# Patient Record
Sex: Male | Born: 1940 | Race: White | Hispanic: No | Marital: Married | Smoking: Former smoker
Health system: Southern US, Community
[De-identification: ages and names within clinical notes are randomized; demographics above are authoritative.]

## PROBLEM LIST (undated history)

## (undated) DIAGNOSIS — Z933 Colostomy status: Secondary | ICD-10-CM

## (undated) DIAGNOSIS — Z9221 Personal history of antineoplastic chemotherapy: Secondary | ICD-10-CM

## (undated) DIAGNOSIS — E8809 Other disorders of plasma-protein metabolism, not elsewhere classified: Secondary | ICD-10-CM

## (undated) DIAGNOSIS — I471 Supraventricular tachycardia, unspecified: Secondary | ICD-10-CM

## (undated) DIAGNOSIS — K219 Gastro-esophageal reflux disease without esophagitis: Secondary | ICD-10-CM

## (undated) DIAGNOSIS — E785 Hyperlipidemia, unspecified: Secondary | ICD-10-CM

## (undated) DIAGNOSIS — N186 End stage renal disease: Secondary | ICD-10-CM

## (undated) DIAGNOSIS — K922 Gastrointestinal hemorrhage, unspecified: Secondary | ICD-10-CM

## (undated) DIAGNOSIS — I10 Essential (primary) hypertension: Secondary | ICD-10-CM

## (undated) DIAGNOSIS — E46 Unspecified protein-calorie malnutrition: Secondary | ICD-10-CM

## (undated) DIAGNOSIS — R011 Cardiac murmur, unspecified: Secondary | ICD-10-CM

## (undated) DIAGNOSIS — N189 Chronic kidney disease, unspecified: Secondary | ICD-10-CM

## (undated) DIAGNOSIS — D649 Anemia, unspecified: Secondary | ICD-10-CM

## (undated) DIAGNOSIS — R0602 Shortness of breath: Secondary | ICD-10-CM

## (undated) DIAGNOSIS — D631 Anemia in chronic kidney disease: Secondary | ICD-10-CM

## (undated) DIAGNOSIS — N289 Disorder of kidney and ureter, unspecified: Secondary | ICD-10-CM

## (undated) DIAGNOSIS — Z923 Personal history of irradiation: Secondary | ICD-10-CM

## (undated) DIAGNOSIS — Z9289 Personal history of other medical treatment: Secondary | ICD-10-CM

## (undated) DIAGNOSIS — I714 Abdominal aortic aneurysm, without rupture: Secondary | ICD-10-CM

## (undated) DIAGNOSIS — I5032 Chronic diastolic (congestive) heart failure: Secondary | ICD-10-CM

## (undated) DIAGNOSIS — I214 Non-ST elevation (NSTEMI) myocardial infarction: Secondary | ICD-10-CM

## (undated) DIAGNOSIS — C3490 Malignant neoplasm of unspecified part of unspecified bronchus or lung: Secondary | ICD-10-CM

## (undated) DIAGNOSIS — M1990 Unspecified osteoarthritis, unspecified site: Secondary | ICD-10-CM

## (undated) DIAGNOSIS — Z888 Allergy status to other drugs, medicaments and biological substances status: Secondary | ICD-10-CM

## (undated) DIAGNOSIS — I350 Nonrheumatic aortic (valve) stenosis: Secondary | ICD-10-CM

## (undated) DIAGNOSIS — Z992 Dependence on renal dialysis: Secondary | ICD-10-CM

## (undated) HISTORY — DX: Supraventricular tachycardia, unspecified: I47.10

## (undated) HISTORY — DX: Supraventricular tachycardia: I47.1

## (undated) HISTORY — DX: Nonrheumatic aortic (valve) stenosis: I35.0

## (undated) HISTORY — PX: COLON SURGERY: SHX602

## (undated) HISTORY — DX: Essential (primary) hypertension: I10

## (undated) HISTORY — DX: Gastro-esophageal reflux disease without esophagitis: K21.9

## (undated) HISTORY — PX: CATARACT EXTRACTION W/ INTRAOCULAR LENS IMPLANT: SHX1309

## (undated) HISTORY — DX: Malignant neoplasm of unspecified part of unspecified bronchus or lung: C34.90

## (undated) HISTORY — PX: PILONIDAL CYST / SINUS EXCISION: SUR543

## (undated) HISTORY — DX: Colostomy status: Z93.3

## (undated) HISTORY — DX: Hyperlipidemia, unspecified: E78.5

---

## 2006-07-23 DIAGNOSIS — Z9289 Personal history of other medical treatment: Secondary | ICD-10-CM

## 2006-07-23 HISTORY — DX: Personal history of other medical treatment: Z92.89

## 2006-07-23 HISTORY — PX: ARTERIOVENOUS GRAFT PLACEMENT: SUR1029

## 2007-02-21 DIAGNOSIS — I714 Abdominal aortic aneurysm, without rupture, unspecified: Secondary | ICD-10-CM

## 2007-02-21 HISTORY — DX: Abdominal aortic aneurysm, without rupture, unspecified: I71.40

## 2007-03-11 ENCOUNTER — Emergency Department: Payer: Self-pay | Admitting: Emergency Medicine

## 2007-03-11 ENCOUNTER — Other Ambulatory Visit: Payer: Self-pay

## 2007-03-14 HISTORY — PX: ABDOMINAL AORTIC ANEURYSM REPAIR: SUR1152

## 2007-03-17 HISTORY — PX: LEFT COLECTOMY: SHX856

## 2007-03-17 HISTORY — PX: COLOSTOMY: SHX63

## 2007-05-14 ENCOUNTER — Emergency Department: Payer: Self-pay | Admitting: Emergency Medicine

## 2007-05-28 ENCOUNTER — Ambulatory Visit: Payer: Self-pay | Admitting: Family Medicine

## 2007-07-01 ENCOUNTER — Other Ambulatory Visit: Payer: Self-pay

## 2007-07-01 ENCOUNTER — Emergency Department: Payer: Self-pay | Admitting: Emergency Medicine

## 2007-07-24 DIAGNOSIS — C3490 Malignant neoplasm of unspecified part of unspecified bronchus or lung: Secondary | ICD-10-CM

## 2007-07-24 HISTORY — DX: Malignant neoplasm of unspecified part of unspecified bronchus or lung: C34.90

## 2007-10-09 ENCOUNTER — Ambulatory Visit: Payer: Self-pay | Admitting: Otolaryngology

## 2008-03-23 ENCOUNTER — Ambulatory Visit: Payer: Self-pay | Admitting: Oncology

## 2008-04-05 ENCOUNTER — Ambulatory Visit: Payer: Self-pay | Admitting: Oncology

## 2008-04-08 ENCOUNTER — Ambulatory Visit: Payer: Self-pay | Admitting: Oncology

## 2008-04-16 ENCOUNTER — Other Ambulatory Visit: Payer: Self-pay

## 2008-04-16 ENCOUNTER — Ambulatory Visit: Payer: Self-pay | Admitting: General Surgery

## 2008-04-21 ENCOUNTER — Inpatient Hospital Stay: Payer: Self-pay | Admitting: General Surgery

## 2008-04-22 ENCOUNTER — Ambulatory Visit: Payer: Self-pay | Admitting: Radiation Oncology

## 2008-04-30 ENCOUNTER — Ambulatory Visit: Payer: Self-pay | Admitting: Vascular Surgery

## 2008-05-05 ENCOUNTER — Ambulatory Visit: Payer: Self-pay | Admitting: Oncology

## 2008-05-14 ENCOUNTER — Ambulatory Visit: Payer: Self-pay | Admitting: Vascular Surgery

## 2008-05-23 ENCOUNTER — Ambulatory Visit: Payer: Self-pay | Admitting: Oncology

## 2008-06-15 ENCOUNTER — Ambulatory Visit: Payer: Self-pay | Admitting: Vascular Surgery

## 2008-06-22 ENCOUNTER — Ambulatory Visit: Payer: Self-pay | Admitting: Oncology

## 2008-07-23 ENCOUNTER — Ambulatory Visit: Payer: Self-pay | Admitting: Oncology

## 2008-08-23 ENCOUNTER — Ambulatory Visit: Payer: Self-pay | Admitting: Oncology

## 2008-09-07 ENCOUNTER — Ambulatory Visit: Payer: Self-pay | Admitting: Oncology

## 2008-09-16 ENCOUNTER — Ambulatory Visit: Payer: Self-pay | Admitting: Oncology

## 2008-09-20 ENCOUNTER — Ambulatory Visit: Payer: Self-pay | Admitting: Oncology

## 2008-10-28 ENCOUNTER — Ambulatory Visit: Payer: Self-pay | Admitting: Oncology

## 2008-11-20 ENCOUNTER — Ambulatory Visit: Payer: Self-pay | Admitting: Oncology

## 2008-12-21 ENCOUNTER — Ambulatory Visit: Payer: Self-pay | Admitting: Oncology

## 2009-01-21 ENCOUNTER — Ambulatory Visit: Payer: Self-pay | Admitting: Vascular Surgery

## 2009-01-25 ENCOUNTER — Ambulatory Visit: Payer: Self-pay | Admitting: Vascular Surgery

## 2009-02-20 ENCOUNTER — Ambulatory Visit: Payer: Self-pay | Admitting: Oncology

## 2009-03-15 ENCOUNTER — Ambulatory Visit: Payer: Self-pay | Admitting: Vascular Surgery

## 2009-03-15 ENCOUNTER — Ambulatory Visit: Payer: Self-pay | Admitting: Oncology

## 2009-03-17 ENCOUNTER — Ambulatory Visit: Payer: Self-pay | Admitting: Oncology

## 2009-03-23 ENCOUNTER — Ambulatory Visit: Payer: Self-pay | Admitting: Oncology

## 2009-04-04 ENCOUNTER — Inpatient Hospital Stay: Payer: Self-pay | Admitting: Internal Medicine

## 2009-06-22 ENCOUNTER — Ambulatory Visit: Payer: Self-pay | Admitting: Oncology

## 2009-07-13 ENCOUNTER — Ambulatory Visit: Payer: Self-pay | Admitting: Oncology

## 2009-07-23 ENCOUNTER — Ambulatory Visit: Payer: Self-pay | Admitting: Oncology

## 2009-08-24 ENCOUNTER — Ambulatory Visit: Payer: Self-pay | Admitting: Oncology

## 2009-09-20 ENCOUNTER — Ambulatory Visit: Payer: Self-pay | Admitting: Oncology

## 2009-10-04 ENCOUNTER — Ambulatory Visit: Payer: Self-pay | Admitting: Oncology

## 2009-10-07 ENCOUNTER — Ambulatory Visit: Payer: Self-pay | Admitting: Oncology

## 2009-10-21 ENCOUNTER — Ambulatory Visit: Payer: Self-pay | Admitting: Oncology

## 2009-11-20 ENCOUNTER — Ambulatory Visit: Payer: Self-pay | Admitting: Oncology

## 2009-11-24 ENCOUNTER — Ambulatory Visit: Payer: Self-pay | Admitting: Oncology

## 2009-12-08 ENCOUNTER — Ambulatory Visit: Payer: Self-pay | Admitting: Vascular Surgery

## 2009-12-21 ENCOUNTER — Ambulatory Visit: Payer: Self-pay | Admitting: Oncology

## 2010-01-20 ENCOUNTER — Ambulatory Visit: Payer: Self-pay | Admitting: Oncology

## 2010-02-20 ENCOUNTER — Ambulatory Visit: Payer: Self-pay | Admitting: Oncology

## 2010-03-23 ENCOUNTER — Ambulatory Visit: Payer: Self-pay | Admitting: Oncology

## 2010-04-22 ENCOUNTER — Ambulatory Visit: Payer: Self-pay | Admitting: Oncology

## 2010-05-18 ENCOUNTER — Ambulatory Visit: Payer: Self-pay | Admitting: Vascular Surgery

## 2010-05-23 ENCOUNTER — Ambulatory Visit: Payer: Self-pay | Admitting: Oncology

## 2010-05-30 ENCOUNTER — Ambulatory Visit: Payer: Self-pay | Admitting: Oncology

## 2010-06-10 ENCOUNTER — Emergency Department: Payer: Self-pay | Admitting: Emergency Medicine

## 2010-06-13 ENCOUNTER — Ambulatory Visit: Payer: Self-pay | Admitting: Oncology

## 2010-06-22 ENCOUNTER — Ambulatory Visit: Payer: Self-pay | Admitting: Oncology

## 2010-07-23 ENCOUNTER — Ambulatory Visit: Payer: Self-pay | Admitting: Oncology

## 2010-08-03 ENCOUNTER — Observation Stay: Payer: Self-pay | Admitting: Nephrology

## 2010-08-15 ENCOUNTER — Ambulatory Visit: Payer: Self-pay | Admitting: Oncology

## 2010-08-23 ENCOUNTER — Ambulatory Visit: Payer: Self-pay | Admitting: Oncology

## 2010-11-23 ENCOUNTER — Ambulatory Visit: Payer: Self-pay | Admitting: Oncology

## 2010-11-28 ENCOUNTER — Ambulatory Visit: Payer: Self-pay | Admitting: Oncology

## 2010-11-29 ENCOUNTER — Ambulatory Visit: Payer: Self-pay | Admitting: Ophthalmology

## 2010-12-22 ENCOUNTER — Ambulatory Visit: Payer: Self-pay | Admitting: Oncology

## 2011-01-12 ENCOUNTER — Emergency Department: Payer: Self-pay | Admitting: Emergency Medicine

## 2011-03-06 ENCOUNTER — Ambulatory Visit: Payer: Self-pay | Admitting: Vascular Surgery

## 2011-05-22 ENCOUNTER — Ambulatory Visit: Payer: Self-pay | Admitting: Oncology

## 2011-05-24 ENCOUNTER — Ambulatory Visit: Payer: Self-pay | Admitting: Oncology

## 2011-06-15 ENCOUNTER — Encounter: Payer: Self-pay | Admitting: Internal Medicine

## 2011-06-18 ENCOUNTER — Encounter: Payer: Self-pay | Admitting: Internal Medicine

## 2011-06-18 ENCOUNTER — Ambulatory Visit (INDEPENDENT_AMBULATORY_CARE_PROVIDER_SITE_OTHER): Payer: Medicare Other | Admitting: Internal Medicine

## 2011-06-18 VITALS — BP 148/70 | HR 111 | Temp 98.5°F | Wt 156.0 lb

## 2011-06-18 DIAGNOSIS — Z9049 Acquired absence of other specified parts of digestive tract: Secondary | ICD-10-CM

## 2011-06-18 DIAGNOSIS — Z9889 Other specified postprocedural states: Secondary | ICD-10-CM

## 2011-06-18 DIAGNOSIS — N186 End stage renal disease: Secondary | ICD-10-CM

## 2011-06-18 NOTE — Progress Notes (Signed)
Subjective:    Patient ID: Gerald Cooke, male    DOB: January 20, 1941, 70 y.o.   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 He is doing well. Has no complaints today. Continues 3x week dialysis.  Ostomy functioning well. Main issue at present is that wife recently had "psychotic break" and is unable to help care for him (for example ostomy care).  He requests some assistance with daily care.  Outpatient Encounter Prescriptions as of 06/18/2011  Medication Sig Dispense Refill  . amLODipine (NORVASC) 10 MG tablet Take 10 mg by mouth daily.        Marland Kitchen aspirin EC 325 MG tablet Take 325 mg by mouth daily.        Marland Kitchen atorvastatin (LIPITOR) 40 MG tablet Take 40 mg by mouth daily.        . B Complex-C-Folic Acid (NEPHRONEX) LIQD Take by mouth.        . Calcium Acetate 667 MG TABS Take 1 tablet by mouth 3 (three) times daily with meals.        . cetirizine (ZYRTEC) 10 MG tablet Take 10 mg by mouth daily.        . fish oil-omega-3 fatty acids 1000 MG capsule Take 2 g by mouth daily.        Marland Kitchen lisinopril (PRINIVIL,ZESTRIL) 5 MG tablet Take 5 mg by mouth daily.        . ondansetron (ZOFRAN) 8 MG tablet Take 8 mg by mouth every 6 (six) hours as needed.        . ranitidine (ZANTAC) 150 MG tablet Take 150 mg by mouth 2 (two) times daily.        . sevelamer (RENVELA) 800 MG tablet Take 800 mg by mouth 3 (three) times daily with meals.        Marland Kitchen esomeprazole (NEXIUM) 40 MG capsule Take 40 mg by mouth 2 (two) times daily.        Marland Kitchen nystatin (NYSTOP) 100000 UNIT/GM POWD Apply topically as directed.        Marland Kitchen DISCONTD: Flaxseed, Linseed, (FLAXSEED OIL) 1200 MG CAPS Take 1 capsule by mouth 2 (two) times daily.          Review of Systems  Constitutional: Negative for fever, chills, activity change, appetite change, fatigue and unexpected weight change.  Eyes: Negative for visual disturbance.  Respiratory: Negative for cough and shortness of  breath.   Cardiovascular: Negative for chest pain, palpitations and leg swelling.  Gastrointestinal: Negative for abdominal pain and abdominal distention.  Genitourinary: Negative for dysuria, urgency and difficulty urinating.  Musculoskeletal: Negative for arthralgias and gait problem.  Skin: Negative for color change and rash.  Hematological: Negative for adenopathy.  Psychiatric/Behavioral: Negative for sleep disturbance and dysphoric mood. The patient is not nervous/anxious.    BP 148/70  Pulse 111  Temp(Src) 98.5 F (36.9 C) (Oral)  Wt 156 lb (70.761 kg)  SpO2 98%     Objective:   Physical Exam  Constitutional: He is oriented to person, place, and time. He appears well-developed and well-nourished. No distress.  HENT:  Head: Normocephalic and atraumatic.  Right Ear: External ear normal.  Left Ear: External ear normal.  Nose: Nose normal.  Mouth/Throat: Oropharynx is clear and moist. No oropharyngeal exudate.  Eyes: Conjunctivae and EOM are normal. Pupils are equal, round, and reactive to light. Right eye exhibits no discharge. Left eye exhibits no discharge. No  scleral icterus.  Neck: Normal range of motion. Neck supple. No tracheal deviation present. No thyromegaly present.  Cardiovascular: Normal rate, regular rhythm and normal heart sounds.  Exam reveals no gallop and no friction rub.   No murmur heard. Pulmonary/Chest: Effort normal and breath sounds normal. No respiratory distress. He has no wheezes. He has no rales. He exhibits no tenderness.  Abdominal: Soft. Bowel sounds are normal. He exhibits no distension and no mass. There is no tenderness. There is no rebound and no guarding.  Musculoskeletal: Normal range of motion. He exhibits edema (left upper extremity).  Lymphadenopathy:    He has no cervical adenopathy.  Neurological: He is alert and oriented to person, place, and time. No cranial nerve deficit. Coordination normal.  Skin: Skin is warm and dry. No rash  noted. He is not diaphoretic. No erythema. No pallor.     Psychiatric: He has a normal mood and affect. His behavior is normal. Judgment and thought content normal.          Assessment & Plan:  1. ESRD on HD - Will get recent records from nephrology. Pt will follow up in 1 month.  2. Bowel resection s/o ostomy - Needs help with ostomy care now that wife is unable to help him. Will set up home health.

## 2011-06-27 DIAGNOSIS — N186 End stage renal disease: Secondary | ICD-10-CM

## 2011-06-27 DIAGNOSIS — Z433 Encounter for attention to colostomy: Secondary | ICD-10-CM

## 2011-06-27 DIAGNOSIS — I120 Hypertensive chronic kidney disease with stage 5 chronic kidney disease or end stage renal disease: Secondary | ICD-10-CM

## 2011-06-27 DIAGNOSIS — Z992 Dependence on renal dialysis: Secondary | ICD-10-CM

## 2011-07-03 ENCOUNTER — Ambulatory Visit: Payer: Self-pay | Admitting: Oncology

## 2011-07-05 ENCOUNTER — Ambulatory Visit: Payer: Self-pay | Admitting: Oncology

## 2011-07-19 ENCOUNTER — Ambulatory Visit (INDEPENDENT_AMBULATORY_CARE_PROVIDER_SITE_OTHER): Payer: Medicare Other | Admitting: Internal Medicine

## 2011-07-19 ENCOUNTER — Encounter: Payer: Self-pay | Admitting: Internal Medicine

## 2011-07-19 VITALS — BP 142/68 | HR 87 | Temp 98.3°F | Wt 156.0 lb

## 2011-07-19 DIAGNOSIS — I10 Essential (primary) hypertension: Secondary | ICD-10-CM | POA: Insufficient documentation

## 2011-07-19 DIAGNOSIS — J40 Bronchitis, not specified as acute or chronic: Secondary | ICD-10-CM

## 2011-07-19 DIAGNOSIS — E785 Hyperlipidemia, unspecified: Secondary | ICD-10-CM

## 2011-07-19 DIAGNOSIS — C3490 Malignant neoplasm of unspecified part of unspecified bronchus or lung: Secondary | ICD-10-CM | POA: Insufficient documentation

## 2011-07-19 DIAGNOSIS — N186 End stage renal disease: Secondary | ICD-10-CM

## 2011-07-19 DIAGNOSIS — Z85118 Personal history of other malignant neoplasm of bronchus and lung: Secondary | ICD-10-CM

## 2011-07-19 MED ORDER — AZITHROMYCIN 250 MG PO TABS: ORAL_TABLET | ORAL | Status: AC

## 2011-07-19 MED ORDER — GUAIFENESIN-CODEINE 100-10 MG/5ML PO SYRP: 5.0000 mL | ORAL_SOLUTION | Freq: Two times a day (BID) | ORAL | Status: DC | PRN

## 2011-07-19 NOTE — Progress Notes (Signed)
Subjective:    Patient ID: Gerald Cooke, male    DOB: 1941-01-19, 70 y.o.   MRN: [id number]  HPI 70YO male with HTN, ESRD on HD, Hyperlipidemia, h/o lung cancer presents for follow up. His primary concern today is 1-2 week h/o cough productive of purulent sputum and nasal congestion. He denies chest pain, shortness of breath, fever, or chills. He has not taken any medication for this. He notes several sick contacts at dialysis.  He reports that he recently saw his oncologist and had CT scan of chest which showed no evidence of disease. He notes that next scan is scheduled for 6 months.  Outpatient Encounter Prescriptions as of 07/19/2011  Medication Sig Dispense Refill  . amLODipine (NORVASC) 10 MG tablet Take 10 mg by mouth daily.        Marland Kitchen aspirin EC 325 MG tablet Take 325 mg by mouth daily.        Marland Kitchen atorvastatin (LIPITOR) 40 MG tablet Take 40 mg by mouth daily.        . B Complex-C-Folic Acid (NEPHRONEX) LIQD Take by mouth.        . Calcium Acetate 667 MG TABS Take 1 tablet by mouth 3 (three) times daily with meals.        . cetirizine (ZYRTEC) 10 MG tablet Take 10 mg by mouth daily.        Marland Kitchen esomeprazole (NEXIUM) 40 MG capsule Take 40 mg by mouth 2 (two) times daily.        . fish oil-omega-3 fatty acids 1000 MG capsule Take 2 g by mouth daily.        Marland Kitchen lisinopril (PRINIVIL,ZESTRIL) 5 MG tablet Take 5 mg by mouth daily.        Marland Kitchen nystatin (NYSTOP) 100000 UNIT/GM POWD Apply topically as directed.        . ondansetron (ZOFRAN) 8 MG tablet Take 8 mg by mouth every 6 (six) hours as needed.        . ranitidine (ZANTAC) 150 MG tablet Take 150 mg by mouth 2 (two) times daily.        . sevelamer (RENVELA) 800 MG tablet Take 800 mg by mouth 3 (three) times daily with meals.        Marland Kitchen azithromycin (ZITHROMAX Z-PAK) 250 MG tablet Take 2 pills day 1, then 1 pill daily days 2-5  6 each  0  . guaiFENesin-codeine (ROBITUSSIN AC) 100-10 MG/5ML syrup Take 5 mLs by mouth 2 (two) times daily as needed for  cough.  240 mL  0    Review of Systems  Constitutional: Negative for fever, chills, activity change and fatigue.  HENT: Positive for congestion. Negative for hearing loss, ear pain, nosebleeds, sore throat, rhinorrhea, sneezing, trouble swallowing, neck pain, neck stiffness, voice change, postnasal drip, sinus pressure, tinnitus and ear discharge.   Eyes: Negative for discharge, redness, itching and visual disturbance.  Respiratory: Positive for cough. Negative for chest tightness, shortness of breath, wheezing and stridor.   Cardiovascular: Negative for chest pain and leg swelling.  Gastrointestinal: Negative for abdominal pain.  Musculoskeletal: Negative for myalgias and arthralgias.  Skin: Negative for color change and rash.  Neurological: Negative for dizziness, facial asymmetry and headaches.  Psychiatric/Behavioral: Negative for sleep disturbance.   BP 142/68  Pulse 87  Temp(Src) 98.3 F (36.8 C) (Oral)  Wt 156 lb (70.761 kg)  SpO2 99%     Objective:   Physical Exam  Constitutional: He is oriented to person, place, and time.  He appears well-developed and well-nourished. No distress.  HENT:  Head: Normocephalic and atraumatic.  Right Ear: External ear normal.  Left Ear: External ear normal.  Nose: Nose normal.  Mouth/Throat: Oropharynx is clear and moist. No oropharyngeal exudate.  Eyes: Conjunctivae and EOM are normal. Pupils are equal, round, and reactive to light. Right eye exhibits no discharge. Left eye exhibits no discharge. No scleral icterus.  Neck: Normal range of motion. Neck supple. No tracheal deviation present. No thyromegaly present.  Cardiovascular: Normal rate, regular rhythm and normal heart sounds.  Exam reveals no gallop and no friction rub.   No murmur heard. Pulmonary/Chest: Effort normal. No accessory muscle usage. Not tachypneic. No respiratory distress. He has no wheezes. He has rales (left lower lobe). He exhibits no tenderness.  Musculoskeletal:  Normal range of motion. He exhibits no edema.  Lymphadenopathy:    He has no cervical adenopathy.  Neurological: He is alert and oriented to person, place, and time. No cranial nerve deficit. Coordination normal.  Skin: Skin is warm and dry. No rash noted. He is not diaphoretic. No erythema. No pallor.  Psychiatric: He has a normal mood and affect. His behavior is normal. Judgment and thought content normal.          Assessment & Plan:  1. Bronchitis - Exam and symptoms most consistent with bronchitis or early pneumonia LLL.  Will treat with azithromycin and use codeine as needed for cough. If no improvement next 48hr, then would like to get CXR.  2. ESRD on HD - Will request records again from Dialysis center as to recent electrolytes and liver function (drawn with labs there).  3. H/o Lung Cancer - Pt reports that recent evaluation showed no evidence of disease. Will request records on this.

## 2011-07-24 ENCOUNTER — Ambulatory Visit: Payer: Self-pay | Admitting: Oncology

## 2011-08-29 ENCOUNTER — Encounter: Payer: Self-pay | Admitting: Internal Medicine

## 2011-09-11 ENCOUNTER — Emergency Department: Payer: Self-pay | Admitting: Unknown Physician Specialty

## 2011-09-11 ENCOUNTER — Telehealth: Payer: Self-pay | Admitting: *Deleted

## 2011-09-11 LAB — LIPASE, BLOOD: Lipase: 337 U/L (ref 73–393)

## 2011-09-11 LAB — CBC
HCT: 36.1 % — ABNORMAL LOW (ref 40.0–52.0)
HGB: 11.5 g/dL — ABNORMAL LOW (ref 13.0–18.0)
MCH: 30.3 pg (ref 26.0–34.0)
MCHC: 32 g/dL (ref 32.0–36.0)
MCV: 95 fL (ref 80–100)
RBC: 3.82 10*6/uL — ABNORMAL LOW (ref 4.40–5.90)

## 2011-09-11 LAB — COMPREHENSIVE METABOLIC PANEL
Albumin: 3.4 g/dL (ref 3.4–5.0)
Alkaline Phosphatase: 63 U/L (ref 50–136)
Anion Gap: 8 (ref 7–16)
BUN: 31 mg/dL — ABNORMAL HIGH (ref 7–18)
Bilirubin,Total: 0.4 mg/dL (ref 0.2–1.0)
Chloride: 102 mmol/L (ref 98–107)
Creatinine: 5.15 mg/dL — ABNORMAL HIGH (ref 0.60–1.30)
Glucose: 86 mg/dL (ref 65–99)
SGOT(AST): 17 U/L (ref 15–37)
SGPT (ALT): 20 U/L
Total Protein: 8 g/dL (ref 6.4–8.2)

## 2011-09-11 LAB — MAGNESIUM: Magnesium: 2.2 mg/dL

## 2011-09-11 LAB — PROTIME-INR
INR: 0.9
Prothrombin Time: 12.7 secs (ref 11.5–14.7)

## 2011-09-11 NOTE — Telephone Encounter (Signed)
Patient walked in this morning complaining of coughing up blood since earlier this morning.  Per Dr. Dan Humphreys advised pt to go to ER for possible chest CT and other testing.

## 2011-10-08 LAB — COMPREHENSIVE METABOLIC PANEL
Anion Gap: 13 (ref 7–16)
BUN: 20 mg/dL — ABNORMAL HIGH (ref 7–18)
Calcium, Total: 8.5 mg/dL (ref 8.5–10.1)
Chloride: 102 mmol/L (ref 98–107)
Glucose: 132 mg/dL — ABNORMAL HIGH (ref 65–99)
Potassium: 3.9 mmol/L (ref 3.5–5.1)
SGOT(AST): 22 U/L (ref 15–37)
SGPT (ALT): 18 U/L
Sodium: 144 mmol/L (ref 136–145)
Total Protein: 7.4 g/dL (ref 6.4–8.2)

## 2011-10-08 LAB — CBC
HCT: 31.1 % — ABNORMAL LOW (ref 40.0–52.0)
HGB: 10.6 g/dL — ABNORMAL LOW (ref 13.0–18.0)
MCHC: 34.1 g/dL (ref 32.0–36.0)
MCV: 93 fL (ref 80–100)
Platelet: 229 10*3/uL (ref 150–440)
RBC: 3.33 10*6/uL — ABNORMAL LOW (ref 4.40–5.90)
WBC: 5.7 10*3/uL (ref 3.8–10.6)

## 2011-10-08 LAB — MAGNESIUM: Magnesium: 1.9 mg/dL

## 2011-10-08 LAB — CK TOTAL AND CKMB (NOT AT ARMC)
CK, Total: 39 U/L (ref 35–232)
CK-MB: 0.8 ng/mL (ref 0.5–3.6)

## 2011-10-08 LAB — TSH: Thyroid Stimulating Horm: 1.71 u[IU]/mL

## 2011-10-09 ENCOUNTER — Inpatient Hospital Stay: Payer: Self-pay | Admitting: *Deleted

## 2011-10-09 DIAGNOSIS — I359 Nonrheumatic aortic valve disorder, unspecified: Secondary | ICD-10-CM

## 2011-10-09 DIAGNOSIS — R748 Abnormal levels of other serum enzymes: Secondary | ICD-10-CM

## 2011-10-09 DIAGNOSIS — I471 Supraventricular tachycardia: Secondary | ICD-10-CM

## 2011-10-09 DIAGNOSIS — R011 Cardiac murmur, unspecified: Secondary | ICD-10-CM

## 2011-10-09 LAB — TROPONIN I
Troponin-I: 0.32 ng/mL — ABNORMAL HIGH
Troponin-I: 0.52 ng/mL — ABNORMAL HIGH

## 2011-10-09 LAB — URINALYSIS, COMPLETE
Bacteria: NONE SEEN
Bilirubin,UR: NEGATIVE
Blood: NEGATIVE
Glucose,UR: >=500 mg/dL (ref 0–75)
Ketone: NEGATIVE
Ph: 9 (ref 4.5–8.0)
RBC,UR: 2 /HPF (ref 0–5)
Specific Gravity: 1.002 (ref 1.003–1.030)
Squamous Epithelial: NONE SEEN

## 2011-10-09 LAB — CK TOTAL AND CKMB (NOT AT ARMC)
CK, Total: 43 U/L (ref 35–232)
CK, Total: 41 U/L (ref 35–232)
CK-MB: 1.4 ng/mL (ref 0.5–3.6)
CK-MB: 2 ng/mL (ref 0.5–3.6)

## 2011-10-10 LAB — CBC WITH DIFFERENTIAL/PLATELET
Basophil #: 0 10*3/uL (ref 0.0–0.1)
Eosinophil #: 0.4 10*3/uL (ref 0.0–0.7)
HGB: 9.5 g/dL — ABNORMAL LOW (ref 13.0–18.0)
Lymphocyte #: 0.6 10*3/uL — ABNORMAL LOW (ref 1.0–3.6)
MCH: 31.4 pg (ref 26.0–34.0)
MCHC: 33.1 g/dL (ref 32.0–36.0)
MCV: 95 fL (ref 80–100)
Neutrophil #: 4.1 10*3/uL (ref 1.4–6.5)
Neutrophil %: 69.3 %
RBC: 3.02 10*6/uL — ABNORMAL LOW (ref 4.40–5.90)
RDW: 15.5 % — ABNORMAL HIGH (ref 11.5–14.5)

## 2011-10-10 LAB — BASIC METABOLIC PANEL
Anion Gap: 15 (ref 7–16)
BUN: 36 mg/dL — ABNORMAL HIGH (ref 7–18)
EGFR (Non-African Amer.): 11 — ABNORMAL LOW
Osmolality: 293 (ref 275–301)
Potassium: 5.2 mmol/L — ABNORMAL HIGH (ref 3.5–5.1)
Sodium: 143 mmol/L (ref 136–145)

## 2011-10-10 LAB — PHOSPHORUS: Phosphorus: 4.5 mg/dL (ref 2.5–4.9)

## 2011-10-18 ENCOUNTER — Ambulatory Visit (INDEPENDENT_AMBULATORY_CARE_PROVIDER_SITE_OTHER): Payer: Medicare Other | Admitting: Internal Medicine

## 2011-10-18 ENCOUNTER — Encounter: Payer: Self-pay | Admitting: Internal Medicine

## 2011-10-18 VITALS — BP 128/86 | HR 71 | Temp 98.2°F | Wt 150.8 lb

## 2011-10-18 DIAGNOSIS — I10 Essential (primary) hypertension: Secondary | ICD-10-CM

## 2011-10-18 DIAGNOSIS — I471 Supraventricular tachycardia, unspecified: Secondary | ICD-10-CM | POA: Insufficient documentation

## 2011-10-18 DIAGNOSIS — R05 Cough: Secondary | ICD-10-CM | POA: Insufficient documentation

## 2011-10-18 DIAGNOSIS — R110 Nausea: Secondary | ICD-10-CM | POA: Insufficient documentation

## 2011-10-18 MED ORDER — ONDANSETRON HCL 8 MG PO TABS: 8.0000 mg | ORAL_TABLET | Freq: Four times a day (QID) | ORAL | Status: DC | PRN

## 2011-10-18 NOTE — Assessment & Plan Note (Signed)
BP well controlled today. Will continue current medications, except for lisinopril, which we will temporarily stop to see if any improvement in cough. Follow up 3 months.

## 2011-10-18 NOTE — Progress Notes (Signed)
Subjective:    Patient ID: Gerald Cooke, male    DOB: [birth year], 71 y.o.   MRN: [id number]  HPI 71 year old male with history of aortic aneurysm status post rupture, lung cancer, end-stage renal disease on hemodialysis, and status post colectomy presents for followup. He was recently hospitalized with paroxysmal supraventricular tachycardia. He converted to sinus rhythm with use of adenosine. He was then started on Cardizem and notes he has been doing well in this medication. Heart rate has been well-controlled. He denies any palpitations or chest pain. However, he notes that blood pressures have been elevated with systolic blood pressure between 150 and 180. He is concerned about his risk of stroke with this blood pressure.  He also recently notes increase in his nausea. This is often correlated with dialysis sessions. He uses Zofran with improvement in his symptoms. He denies any vomiting, or change in output from his ostomy. He denies fever or chills.  He is also concerned about recent dry cough. This has been going on for several weeks. He denies any fever, chills, chest pain, sputum production, nasal congestion. He denies shortness of breath or wheezing. He has not taken any medication for this.  Outpatient Encounter Prescriptions as of 10/18/2011  Medication Sig Dispense Refill  . diltiazem (CARDIZEM CD) 120 MG 24 hr capsule Take 120 mg by mouth daily.      Marland Kitchen amLODipine (NORVASC) 10 MG tablet Take 10 mg by mouth daily.        Marland Kitchen aspirin EC 325 MG tablet Take 325 mg by mouth daily.        Marland Kitchen atorvastatin (LIPITOR) 40 MG tablet Take 40 mg by mouth daily.        . B Complex-C-Folic Acid (NEPHRONEX) LIQD Take by mouth.        . Calcium Acetate 667 MG TABS Take 1 tablet by mouth 3 (three) times daily with meals.        . cetirizine (ZYRTEC) 10 MG tablet Take 10 mg by mouth daily.        Marland Kitchen esomeprazole (NEXIUM) 40 MG capsule Take 40 mg by mouth 2 (two) times daily.        . fish oil-omega-3  fatty acids 1000 MG capsule Take 2 g by mouth daily.        Marland Kitchen guaiFENesin-codeine (ROBITUSSIN AC) 100-10 MG/5ML syrup Take 5 mLs by mouth 2 (two) times daily as needed for cough.  240 mL  0  . lisinopril (PRINIVIL,ZESTRIL) 5 MG tablet Take 5 mg by mouth daily.        Marland Kitchen nystatin (NYSTOP) 100000 UNIT/GM POWD Apply topically as directed.        . ondansetron (ZOFRAN) 8 MG tablet Take 1 tablet (8 mg total) by mouth every 6 (six) hours as needed.  60 tablet  3  . ranitidine (ZANTAC) 150 MG tablet Take 150 mg by mouth 2 (two) times daily.        . sevelamer (RENVELA) 800 MG tablet Take 800 mg by mouth 3 (three) times daily with meals.        Marland Kitchen DISCONTD: ondansetron (ZOFRAN) 8 MG tablet Take 8 mg by mouth every 6 (six) hours as needed.          Review of Systems  Constitutional: Negative for fever, chills, activity change, appetite change, fatigue and unexpected weight change.  Eyes: Negative for visual disturbance.  Respiratory: Positive for cough. Negative for shortness of breath.   Cardiovascular: Negative for chest pain, palpitations  and leg swelling.  Gastrointestinal: Positive for nausea. Negative for vomiting, abdominal pain, blood in stool and abdominal distention.  Genitourinary: Negative for dysuria, urgency and difficulty urinating.  Musculoskeletal: Negative for arthralgias and gait problem.  Skin: Negative for color change and rash.  Hematological: Negative for adenopathy.  Psychiatric/Behavioral: Negative for sleep disturbance and dysphoric mood. The patient is not nervous/anxious.    BP 128/86  Pulse 71  Temp(Src) 98.2 F (36.8 C) (Oral)  Wt 150 lb 12 oz (68.38 kg)  SpO2 99%     Objective:   Physical Exam  Constitutional: He is oriented to person, place, and time. He appears well-developed and well-nourished. No distress.  HENT:  Head: Normocephalic and atraumatic.  Right Ear: External ear normal.  Left Ear: External ear normal.  Nose: Nose normal.  Mouth/Throat:  Oropharynx is clear and moist. No oropharyngeal exudate.  Eyes: Conjunctivae and EOM are normal. Pupils are equal, round, and reactive to light. Right eye exhibits no discharge. Left eye exhibits no discharge. No scleral icterus.  Neck: Normal range of motion. Neck supple. No tracheal deviation present. No thyromegaly present.  Cardiovascular: Normal rate, regular rhythm and normal heart sounds.  Exam reveals no gallop and no friction rub.   No murmur heard. Pulmonary/Chest: Effort normal and breath sounds normal. No respiratory distress. He has no wheezes. He has no rales. He exhibits no tenderness.  Abdominal: Soft. Bowel sounds are normal. He exhibits no distension.    Musculoskeletal: Normal range of motion. He exhibits no edema.  Lymphadenopathy:    He has no cervical adenopathy.  Neurological: He is alert and oriented to person, place, and time. No cranial nerve deficit. Coordination normal.  Skin: Skin is warm and dry. No rash noted. He is not diaphoretic. No erythema. No pallor.  Psychiatric: He has a normal mood and affect. His behavior is normal. Judgment and thought content normal.          Assessment & Plan:

## 2011-10-18 NOTE — Assessment & Plan Note (Signed)
Likely related to medications and ESRD.  No acute vomiting or other symptoms to suggest gastroenteritis. Will continue zofran prn. Follow up prn.

## 2011-10-18 NOTE — Assessment & Plan Note (Signed)
Dry cough. Question if this might be related to use of lisinopril. Will hold lisinopril for 2-3 days to see if any improvement.

## 2011-10-18 NOTE — Assessment & Plan Note (Signed)
S/p recent hospitalization for PSVT. Converted to sinus rhythm with adenosine. Now on Cardizem. Doing well. HR well controlled. Has follow up with Dr. Mariah Milling next week.

## 2011-10-23 ENCOUNTER — Ambulatory Visit: Payer: Medicare Other | Admitting: Internal Medicine

## 2011-10-29 ENCOUNTER — Encounter: Payer: Self-pay | Admitting: *Deleted

## 2011-11-01 ENCOUNTER — Encounter: Payer: Medicare Other | Admitting: Cardiovascular Disease

## 2011-11-06 ENCOUNTER — Encounter: Payer: Self-pay | Admitting: Cardiovascular Disease

## 2011-11-06 ENCOUNTER — Ambulatory Visit (INDEPENDENT_AMBULATORY_CARE_PROVIDER_SITE_OTHER): Payer: Medicare Other | Admitting: Cardiovascular Disease

## 2011-11-06 VITALS — BP 132/62 | HR 62 | Ht 67.0 in | Wt 161.0 lb

## 2011-11-06 DIAGNOSIS — I359 Nonrheumatic aortic valve disorder, unspecified: Secondary | ICD-10-CM

## 2011-11-06 DIAGNOSIS — E785 Hyperlipidemia, unspecified: Secondary | ICD-10-CM

## 2011-11-06 DIAGNOSIS — R0600 Dyspnea, unspecified: Secondary | ICD-10-CM

## 2011-11-06 DIAGNOSIS — I350 Nonrheumatic aortic (valve) stenosis: Secondary | ICD-10-CM | POA: Insufficient documentation

## 2011-11-06 DIAGNOSIS — I10 Essential (primary) hypertension: Secondary | ICD-10-CM

## 2011-11-06 DIAGNOSIS — I471 Supraventricular tachycardia: Secondary | ICD-10-CM

## 2011-11-06 DIAGNOSIS — R0989 Other specified symptoms and signs involving the circulatory and respiratory systems: Secondary | ICD-10-CM

## 2011-11-06 MED ORDER — DILTIAZEM HCL ER COATED BEADS 120 MG PO CP24: 120.0000 mg | ORAL_CAPSULE | Freq: Every day | ORAL | Status: DC

## 2011-11-06 MED ORDER — METOPROLOL TARTRATE 25 MG PO TABS: 25.0000 mg | ORAL_TABLET | Freq: Two times a day (BID) | ORAL | Status: DC

## 2011-11-06 NOTE — Assessment & Plan Note (Signed)
The patient had a recent episode of supraventricular tachycardia that responded to adenosine. He has been on metoprolol and diltiazem since then with no evidence of recurrent arrhythmia. He seems to be tolerating these 2 medications. No changes will be made today. If he develops recurrent arrhythmia in the future, he might require an antiarrhythmic medication or consideration of an ablation procedure.

## 2011-11-06 NOTE — Assessment & Plan Note (Signed)
His blood pressure seems to be well-controlled. Continue current medications. 

## 2011-11-06 NOTE — Assessment & Plan Note (Signed)
The patient had moderate aortic stenosis that will need to be monitored by echocardiogram. I recommend a followup echocardiogram in one year.

## 2011-11-06 NOTE — Patient Instructions (Signed)
Follow up with Dr. Mariah Milling in 4 months.

## 2011-11-06 NOTE — Progress Notes (Signed)
HPI  This is a 71 year old male who is here today for a followup visit after recent hospitalization. He has a history of aortic aneurysm rupture in 2008 with prolonged hospitalization that required colectomy as well as nephrectomy. He has been on dialysis since then. He presented recently to [facility] with fatigue and palpitations. He was found to be tachycardic with a heart rate of 200 beats per minute. It was due to supraventricular tachycardia. He converted to sinus rhythm with adenosine. His cardiac enzymes were slightly elevated thought to be due to supply demand ischemia. He had an echocardiogram done which showed normal LV systolic function with evidence of moderate aortic stenosis and a valve area of 1.4 cm. His blood pressure medications were switched from amlodipine and lisinopril to metoprolol and long-acting diltiazem. Since then, the patient has not had any recurrent palpitations or tachycardia. He has been doing reasonably well. He denies any chest pain. He has mild exertional dyspnea.  Allergies  Allergen Reactions  . Heparin   . Tetanus Toxoids      Current Outpatient Prescriptions on File Prior to Visit  Medication Sig Dispense Refill  . aspirin EC 325 MG tablet Take 325 mg by mouth daily.        . cetirizine (ZYRTEC) 10 MG tablet Take 10 mg by mouth daily.        Marland Kitchen esomeprazole (NEXIUM) 40 MG capsule Take 40 mg by mouth 2 (two) times daily.        . fish oil-omega-3 fatty acids 1000 MG capsule Take 2 g by mouth daily.        . metoprolol tartrate (LOPRESSOR) 25 MG tablet Take 1 tablet (25 mg total) by mouth 2 (two) times daily.  60 tablet  6  . ranitidine (ZANTAC) 150 MG tablet Take 150 mg by mouth 2 (two) times daily.        . sevelamer (RENVELA) 800 MG tablet Take 800 mg by mouth 3 (three) times daily with meals.        . simvastatin (ZOCOR) 20 MG tablet Take 20 mg by mouth every evening.      Marland Kitchen DISCONTD: diltiazem (CARDIZEM CD) 120 MG 24 hr  capsule Take 120 mg by mouth daily.         Past Medical History  Diagnosis Date  . Colostomy in place   . End stage renal disease     HD, Mon, Wed & Frid,  Dr Cherylann Ratel  . Aortic aneurysm 02/2007    Ruptured, hospitalized at [facility] for 52 days, c/b loss of colon and renal function. Followed by Dr Lorretta Harp  . Hyperlipidemia   . Atherosclerosis   . Esophageal reflux   . Lung cancer   . Hypertension   . PSVT (paroxysmal supraventricular tachycardia)   . Aortic stenosis     moderate. Valve area 1.4 cm2     Past Surgical History  Procedure Date  . Colostomy   . Bowel resection     partial, s/p colostomy  . Abdominal aortic aneurysm repair 02/2007    requiring nephrectomy     History reviewed. No pertinent family history.   History   Social History  . Marital Status: Married    Spouse Name: N/A    Number of Children: 2  . Years of Education: N/A   Occupational History  .     Social History Main Topics  . Smoking status: Former Games developer  . Smokeless tobacco: Never Used   Comment:  Quit in 1990's  . Alcohol Use: Not on file  . Drug Use: Not on file  . Sexually Active: Not on file   Other Topics Concern  . Not on file   Social History Narrative  . No narrative on file     PHYSICAL EXAM   BP 132/62  Pulse 62  Ht 5\' 7"  (1.702 m)  Wt 161 lb (73.029 kg)  BMI 25.22 kg/m2 Constitutional: He is oriented to person, place, and time. He appears well-developed and well-nourished. No distress.  HENT: No nasal discharge.  Head: Normocephalic and atraumatic.  Eyes: Pupils are equal and round. Right eye exhibits no discharge. Left eye exhibits no discharge.  Neck: Normal range of motion. Neck supple. No JVD present. No thyromegaly present.  Cardiovascular: Normal rate, regular rhythm, normal heart sounds and. Exam reveals no gallop and no friction rub. There is a 3/6 systolic ejection murmur at the aortic area with preserved S2. The murmur is mid peaking and radiates to the  carotid arteries. Pulmonary/Chest: Effort normal and breath sounds normal. No stridor. No respiratory distress. He has no wheezes. He has no rales. He exhibits no tenderness.  Abdominal: Soft. Bowel sounds are normal. He exhibits no distension. There is no tenderness. There is no rebound and no guarding.  Musculoskeletal: Normal range of motion. He exhibits no edema and no tenderness.  Neurological: He is alert and oriented to person, place, and time. Coordination normal.  Skin: Skin is warm and dry. No rash noted. He is not diaphoretic. No erythema. No pallor.  Psychiatric: He has a normal mood and affect. His behavior is normal. Judgment and thought content normal.       EKG: Normal sinus rhythm with right bundle branch block. Nonspecific T wave changes.   ASSESSMENT AND PLAN

## 2011-11-06 NOTE — Assessment & Plan Note (Signed)
This is likely multifactorial due to previous smoking, possible diastolic dysfunction as well as aortic stenosis. It might be reasonable in the near future to evaluate him for possible underlying coronary artery disease as well with a stress test given all his risk factors.

## 2011-12-11 ENCOUNTER — Ambulatory Visit: Payer: Self-pay | Admitting: Vascular Surgery

## 2011-12-25 ENCOUNTER — Ambulatory Visit: Payer: Self-pay | Admitting: Vascular Surgery

## 2011-12-28 ENCOUNTER — Telehealth: Payer: Self-pay | Admitting: Cardiovascular Disease

## 2011-12-28 NOTE — Telephone Encounter (Signed)
Pt called stating that his BO when he left dialysis was 200/90 and wants to speak to a nurse concerning this.

## 2011-12-28 NOTE — Telephone Encounter (Signed)
I called pt back.  He tells me he had a procedure at [facility] 2 days ago. Since procedure his BP has been elevated. Today BP is 200/100. He deneis h/a or blurred vision at the present time but admits to h/a earlier in the week.  He confirms compliance with BP meds and has had no med changes.  He is unclear as to what procedure was performed.  He continues to go for dialysis routinely and has hx of AAA tear.   In offered to bring him in for BP check but he tells me he is in bed and will not be able to come in.  I told him I would discuss with Dr. Mariah Milling and call him back.  Understanding verb.  I will also look in [facility] system to see what procedure he had done.

## 2011-12-28 NOTE — Telephone Encounter (Signed)
Discussed with Dr. Mariah Milling who advised to take extra diltiazem today and over w/e.  He is to monitor HR and if too low, may hold metoprolol.  If pain is the issue, he should f/u or call surgeon who did procedure.  I called pt who says he was in terrible pain over the last 2 days, but this has improved.  Checked BP again and it was 171/70.  HR=70-80 BPM.  I advised him to take diltiazem BID over w/e and hold metoprolol if HR becomes too low.  He will call us Monday if BP no better with this change.

## 2011-12-28 NOTE — Telephone Encounter (Signed)
Procedure done at [facility] this week  Removal of left IJ Infuse-a-Port.  2. Central venography with introduction catheter superior vena cava.  3. Percutaneous transluminal angioplasty central vein.   4. Placement of a 14 x 60 LIFESTAR stent.

## 2012-01-17 ENCOUNTER — Ambulatory Visit (INDEPENDENT_AMBULATORY_CARE_PROVIDER_SITE_OTHER): Payer: Medicare Other | Admitting: Internal Medicine

## 2012-01-17 ENCOUNTER — Encounter: Payer: Self-pay | Admitting: Internal Medicine

## 2012-01-17 VITALS — BP 150/82 | HR 65 | Temp 98.5°F | Wt 143.5 lb

## 2012-01-17 DIAGNOSIS — I10 Essential (primary) hypertension: Secondary | ICD-10-CM

## 2012-01-17 DIAGNOSIS — R634 Abnormal weight loss: Secondary | ICD-10-CM | POA: Insufficient documentation

## 2012-01-17 NOTE — Assessment & Plan Note (Signed)
Blood pressure slightly elevated today. We'll continue to monitor for now. Continue current medications. Followup 3 months.

## 2012-01-17 NOTE — Patient Instructions (Addendum)
High Calorie Diet A high calorie diet increases the amount of protein and calories you eat. You may need more protein and calories in your diet because of illness, surgery, injury, weight loss, or having a poor appetite. Eating high protein and high calorie foods can help you gain weight, heal, and recover after illness.  SERVING SIZES Measuring foods and serving sizes helps to make sure you are getting the right amount of food. The list below tells how big or small some common serving sizes are.   1 oz.........4 stacked dice.   3 oz........Marland KitchenDeck of cards.   1 tsp.......Marland KitchenTip of little finger.   1 tbs......Marland KitchenMarland KitchenThumb.   2 tbs.......Marland KitchenGolf ball.    cup......Marland KitchenHalf of a fist.   1 cup.......Marland KitchenA fist.  HIGH CALORIE FOODS Grain/Starch  Baked goods, such as muffins and quick breads.   Croissants.   Pancakes and waffles.  Vegetable   Sauted vegetables in oil.   Fried vegetables.   Salad greens with regular salad dressing or vinegar and oil.  Fruit  Dried fruit.   Canned fruit in syrup.   Fruit juice.  Fat  Avocado.   Butter or margarine.   Whipped cream.   Mayonnaise.   Salad dressing.   Peanuts and mixed nuts.   Cream cheese and sour cream.  Sweets and Dessert  Cake.   Cookies.   Pie.   Ice cream.   Doughnuts and pastries.   Protein and meal replacement bars.   Jam, preserves, and jelly.   Candy bars.   Chocolate.   Chocolate, caramel, or other flavored syrups.  Document Released: 07/09/2005 Document Revised: 06/28/2011 Document Reviewed: 04/11/2007 [facility] Patient Information 2012 [place], [place].

## 2012-01-17 NOTE — Assessment & Plan Note (Signed)
Unclear etiology. Patient now has home health services including assistant to help repair meals. Encouraged him to eat foods that are high in calories. Will request recent lab work including electrolytes, blood counts, and thyroid function from dialysis center. Follow up 3 months or sooner if needed.

## 2012-01-17 NOTE — Progress Notes (Signed)
Subjective:    Patient ID: Gerald Cooke, male    DOB: November 05, 1940, 71 y.o.   MRN: [id number]  HPI 71 year old male with history of ruptured aortic aneurysm, complicated by colectomy, renal failure now on hemodialysis, and recent episode of PSVT presents for followup. He reports that he is generally doing well. He now has home health assistance to care for his wife. He has transportation to dialysis 3 times weekly. He reports that recently, his appetite has been diminished. He notes several pound weight loss over the last month. He denies abdominal pain, fever, chills. He denies palpitations or chest pain.   Outpatient Encounter Prescriptions as of 01/17/2012  Medication Sig Dispense Refill  . aspirin EC 325 MG tablet Take 325 mg by mouth daily.        . cetirizine (ZYRTEC) 10 MG tablet Take 10 mg by mouth daily.        Marland Kitchen diltiazem (CARDIZEM CD) 120 MG 24 hr capsule Take 1 capsule (120 mg total) by mouth daily.  30 capsule  6  . esomeprazole (NEXIUM) 40 MG capsule Take 40 mg by mouth 2 (two) times daily.        . fish oil-omega-3 fatty acids 1000 MG capsule Take 2 g by mouth daily.        . metoprolol tartrate (LOPRESSOR) 25 MG tablet Take 1 tablet (25 mg total) by mouth 2 (two) times daily.  60 tablet  6  . ranitidine (ZANTAC) 150 MG tablet Take 150 mg by mouth 2 (two) times daily.        . sevelamer (RENVELA) 800 MG tablet Take 800 mg by mouth 3 (three) times daily with meals.        . simvastatin (ZOCOR) 20 MG tablet Take 20 mg by mouth every evening.        Review of Systems  Constitutional: Positive for unexpected weight change. Negative for fever, chills, activity change, appetite change and fatigue.  Eyes: Negative for visual disturbance.  Respiratory: Negative for cough and shortness of breath.   Cardiovascular: Negative for chest pain, palpitations and leg swelling.  Gastrointestinal: Negative for abdominal pain and abdominal distention.  Genitourinary: Negative for dysuria,  urgency and difficulty urinating.  Musculoskeletal: Negative for arthralgias and gait problem.  Skin: Negative for color change and rash.  Hematological: Negative for adenopathy.  Psychiatric/Behavioral: Negative for disturbed wake/sleep cycle and dysphoric mood. The patient is not nervous/anxious.    BP 150/82  Pulse 65  Temp 98.5 F (36.9 C) (Oral)  Wt 143 lb 8 oz (65.091 kg)  SpO2 98%     Objective:   Physical Exam  Constitutional: He is oriented to person, place, and time. He appears well-developed and well-nourished. No distress.  HENT:  Head: Normocephalic and atraumatic.  Right Ear: External ear normal.  Left Ear: External ear normal.  Nose: Nose normal.  Mouth/Throat: Oropharynx is clear and moist. No oropharyngeal exudate.  Eyes: Conjunctivae and EOM are normal. Pupils are equal, round, and reactive to light. Right eye exhibits no discharge. Left eye exhibits no discharge. No scleral icterus.  Neck: Normal range of motion. Neck supple. No tracheal deviation present. No thyromegaly present.  Cardiovascular: Normal rate, regular rhythm and normal heart sounds.  Exam reveals no gallop and no friction rub.   No murmur heard. Pulmonary/Chest: Effort normal and breath sounds normal. No respiratory distress. He has no wheezes. He has no rales. He exhibits no tenderness.  Abdominal: Soft. Normal appearance and bowel sounds are normal.  There is no tenderness.    Musculoskeletal: Normal range of motion. He exhibits no edema.  Lymphadenopathy:    He has no cervical adenopathy.  Neurological: He is alert and oriented to person, place, and time. No cranial nerve deficit. Coordination normal.  Skin: Skin is warm and dry. No rash noted. He is not diaphoretic. No erythema. No pallor.  Psychiatric: He has a normal mood and affect. His behavior is normal. Judgment and thought content normal.          Assessment & Plan:

## 2012-03-13 ENCOUNTER — Encounter: Payer: Self-pay | Admitting: Cardiovascular Disease

## 2012-03-13 ENCOUNTER — Ambulatory Visit (INDEPENDENT_AMBULATORY_CARE_PROVIDER_SITE_OTHER): Payer: Medicare Other | Admitting: Cardiovascular Disease

## 2012-03-13 VITALS — BP 130/76 | HR 86 | Ht 68.0 in | Wt 140.0 lb

## 2012-03-13 DIAGNOSIS — I10 Essential (primary) hypertension: Secondary | ICD-10-CM

## 2012-03-13 DIAGNOSIS — Z992 Dependence on renal dialysis: Secondary | ICD-10-CM

## 2012-03-13 DIAGNOSIS — I359 Nonrheumatic aortic valve disorder, unspecified: Secondary | ICD-10-CM

## 2012-03-13 DIAGNOSIS — E785 Hyperlipidemia, unspecified: Secondary | ICD-10-CM

## 2012-03-13 DIAGNOSIS — Z9889 Other specified postprocedural states: Secondary | ICD-10-CM

## 2012-03-13 DIAGNOSIS — Z8679 Personal history of other diseases of the circulatory system: Secondary | ICD-10-CM

## 2012-03-13 DIAGNOSIS — I471 Supraventricular tachycardia, unspecified: Secondary | ICD-10-CM

## 2012-03-13 DIAGNOSIS — I350 Nonrheumatic aortic (valve) stenosis: Secondary | ICD-10-CM

## 2012-03-13 DIAGNOSIS — Z85118 Personal history of other malignant neoplasm of bronchus and lung: Secondary | ICD-10-CM

## 2012-03-13 DIAGNOSIS — N186 End stage renal disease: Secondary | ICD-10-CM

## 2012-03-13 NOTE — Patient Instructions (Addendum)
You are doing well. No medication changes were made. Call the office if your blood pressure runs high.  We will schedule an ultrasound of your abdomen to look for aneurysm  Please call us if you have new issues that need to be addressed before your next appt.  Your physician wants you to follow-up in: 6 months.  You will receive a reminder letter in the mail two months in advance. If you don't receive a letter, please call our office to schedule the follow-up appointment.

## 2012-03-13 NOTE — Assessment & Plan Note (Addendum)
Dialysis on Monday, Wednesday, Friday using left upper extremity graft

## 2012-03-13 NOTE — Assessment & Plan Note (Signed)
He does report blood pressure has been borderline elevated. He does miss some doses of his metoprolol. We have suggested he take his metoprolol and if blood pressure continues to be elevated, we could increase the diltiazem to 180 mg daily.

## 2012-03-13 NOTE — Assessment & Plan Note (Signed)
We have suggested he stay on his simvastatin 

## 2012-03-13 NOTE — Assessment & Plan Note (Signed)
Previous chemotherapy and radiation

## 2012-03-13 NOTE — Assessment & Plan Note (Signed)
No further episodes of tachycardia. Improved on his current medication regimen

## 2012-03-13 NOTE — Assessment & Plan Note (Signed)
Mild-to-moderate aortic valve stenosis. Likely contributing to mild shortness of breath with exertion.

## 2012-03-13 NOTE — Progress Notes (Signed)
Patient ID: Gerald Cooke, male    DOB: 08-11-1940, 71 y.o.   MRN: [id number]  HPI Comments: 71 year old male with a history of aortic aneurysm rupture in 2008 with prolonged hospitalization that required colectomy as well as nephrectomy.  on dialysis since then, previous history of cancer with chemotherapy and radiation in 2010, presenting recently to [facility] with fatigue and palpitations,   found to be tachycardic with a heart rate of 200 beats per minute due to supraventricular tachycardia,  converted to sinus rhythm with adenosine.   His cardiac enzymes were slightly elevated thought to be due to supply demand ischemia.  echocardiogram done which showed normal LV systolic function with evidence of mild to moderate aortic stenosis and a valve area of 1.4 cm.  blood pressure medications were switched from amlodipine and lisinopril to metoprolol and long-acting diltiazem.  Since then, the patient has not had any recurrent palpitations or tachycardia. He has been doing reasonably well.   Overall he has been doing well with no complaints. He takes care of his wife who has dementia. She helps out in the [employer] at [employer] [place].  EKG shows normal sinus rhythm with rate 86 beats per minute with right bundle branch block, left anterior fascicular block   Outpatient Encounter Prescriptions as of 03/13/2012  Medication Sig Dispense Refill  . aspirin EC 325 MG tablet Take 325 mg by mouth daily.        . cetirizine (ZYRTEC) 10 MG tablet Take 10 mg by mouth daily.        Marland Kitchen diltiazem (CARDIZEM CD) 120 MG 24 hr capsule Take 1 capsule (120 mg total) by mouth daily.  30 capsule  6  . fish oil-omega-3 fatty acids 1000 MG capsule Take 2 g by mouth daily.        . metoprolol tartrate (LOPRESSOR) 25 MG tablet Take 1 tablet (25 mg total) by mouth 2 (two) times daily.  60 tablet  6  . ranitidine (ZANTAC) 150 MG tablet Take 150 mg by mouth 2 (two) times daily.        . sevelamer  (RENVELA) 800 MG tablet Take 800 mg by mouth 3 (three) times daily with meals.        . simvastatin (ZOCOR) 20 MG tablet Take 20 mg by mouth every evening.      Marland Kitchen DISCONTD: esomeprazole (NEXIUM) 40 MG capsule Take 40 mg by mouth 2 (two) times daily.           Review of Systems  Constitutional: Negative.   HENT: Negative.   Eyes: Negative.   Respiratory: Negative.   Cardiovascular: Negative.   Gastrointestinal: Negative.   Musculoskeletal: Negative.   Skin: Negative.   Neurological: Negative.   Hematological: Negative.   Psychiatric/Behavioral: Negative.   All other systems reviewed and are negative.    BP 130/76  Pulse 86  Ht 5\' 8"  (1.727 m)  Wt 140 lb (63.504 kg)  BMI 21.29 kg/m2  Physical Exam  Nursing note and vitals reviewed. Constitutional: He is oriented to person, place, and time. He appears well-developed and well-nourished.  HENT:  Head: Normocephalic.  Nose: Nose normal.  Mouth/Throat: Oropharynx is clear and moist.  Eyes: Conjunctivae are normal. Pupils are equal, round, and reactive to light.  Neck: Normal range of motion. Neck supple. No JVD present.  Cardiovascular: Normal rate, regular rhythm, S1 normal, S2 normal, normal heart sounds and intact distal pulses.  Exam reveals no gallop and no friction rub.  No murmur heard. Pulmonary/Chest: Effort normal and breath sounds normal. No respiratory distress. He has no wheezes. He has no rales. He exhibits no tenderness.  Abdominal: Soft. Bowel sounds are normal. He exhibits no distension. There is no tenderness.  Musculoskeletal: Normal range of motion. He exhibits no edema and no tenderness.  Lymphadenopathy:    He has no cervical adenopathy.  Neurological: He is alert and oriented to person, place, and time. Coordination normal.  Skin: Skin is warm and dry. No rash noted. No erythema.  Psychiatric: He has a normal mood and affect. His behavior is normal. Judgment and thought content normal.            Assessment and Plan

## 2012-03-25 ENCOUNTER — Encounter (INDEPENDENT_AMBULATORY_CARE_PROVIDER_SITE_OTHER): Payer: Medicare Other

## 2012-03-25 DIAGNOSIS — N185 Chronic kidney disease, stage 5: Secondary | ICD-10-CM

## 2012-03-25 DIAGNOSIS — Z8679 Personal history of other diseases of the circulatory system: Secondary | ICD-10-CM

## 2012-03-25 DIAGNOSIS — I700 Atherosclerosis of aorta: Secondary | ICD-10-CM

## 2012-04-24 ENCOUNTER — Ambulatory Visit: Payer: Medicare Other | Admitting: Internal Medicine

## 2012-04-29 ENCOUNTER — Encounter: Payer: Self-pay | Admitting: Internal Medicine

## 2012-04-29 ENCOUNTER — Ambulatory Visit (INDEPENDENT_AMBULATORY_CARE_PROVIDER_SITE_OTHER): Payer: Medicare Other | Admitting: Internal Medicine

## 2012-04-29 ENCOUNTER — Telehealth: Payer: Self-pay | Admitting: *Deleted

## 2012-04-29 VITALS — BP 120/70 | HR 75 | Temp 98.6°F | Ht 67.0 in | Wt 140.5 lb

## 2012-04-29 DIAGNOSIS — I714 Abdominal aortic aneurysm, without rupture, unspecified: Secondary | ICD-10-CM

## 2012-04-29 DIAGNOSIS — D649 Anemia, unspecified: Secondary | ICD-10-CM

## 2012-04-29 DIAGNOSIS — N185 Chronic kidney disease, stage 5: Secondary | ICD-10-CM

## 2012-04-29 DIAGNOSIS — R634 Abnormal weight loss: Secondary | ICD-10-CM

## 2012-04-29 DIAGNOSIS — E785 Hyperlipidemia, unspecified: Secondary | ICD-10-CM

## 2012-04-29 DIAGNOSIS — Z85118 Personal history of other malignant neoplasm of bronchus and lung: Secondary | ICD-10-CM

## 2012-04-29 LAB — COMPREHENSIVE METABOLIC PANEL
ALT: 11 U/L (ref 0–53)
AST: 12 U/L (ref 0–37)
CO2: 29 mEq/L (ref 19–32)
Calcium: 9.4 mg/dL (ref 8.4–10.5)
Chloride: 97 mEq/L (ref 96–112)
Creatinine, Ser: 5 mg/dL (ref 0.4–1.5)
GFR: 12.2 mL/min — CL (ref >60.00)
Sodium: 136 mEq/L (ref 135–145)
Total Protein: 7.6 g/dL (ref 6.0–8.3)

## 2012-04-29 LAB — LIPID PANEL
HDL: 43.4 mg/dL (ref >39.00)
LDL Cholesterol: 95 mg/dL (ref 0–99)
Total CHOL/HDL Ratio: 4
Triglycerides: 137 mg/dL (ref 0.0–149.0)

## 2012-04-29 LAB — CBC WITH DIFFERENTIAL/PLATELET
Basophils Absolute: 0 10*3/uL (ref 0.0–0.1)
Eosinophils Relative: 2.5 % (ref 0.0–5.0)
HCT: 34.9 % — ABNORMAL LOW (ref 39.0–52.0)
Hemoglobin: 11.4 g/dL — ABNORMAL LOW (ref 13.0–17.0)
Lymphocytes Relative: 7.4 % — ABNORMAL LOW (ref 12.0–46.0)
Lymphs Abs: 0.6 10*3/uL — ABNORMAL LOW (ref 0.7–4.0)
Monocytes Relative: 13.5 % — ABNORMAL HIGH (ref 3.0–12.0)
Platelets: 268 10*3/uL (ref 150.0–400.0)
RDW: 16 % — ABNORMAL HIGH (ref 11.5–14.6)
WBC: 8.4 10*3/uL (ref 4.5–10.5)

## 2012-04-29 LAB — HM COLONOSCOPY

## 2012-04-29 MED ORDER — MIRTAZAPINE 15 MG PO TABS: 15.0000 mg | ORAL_TABLET | Freq: Every day | ORAL | Status: DC

## 2012-04-29 NOTE — Assessment & Plan Note (Signed)
Doing well on Monday Wednesday Friday dialysis. Will get records on recent electrolytes from dialysis Center.

## 2012-04-29 NOTE — Assessment & Plan Note (Signed)
Status post rupture and repair. Recent aortic ultrasound showed stability in plan was to repeat in 2014.

## 2012-04-29 NOTE — Assessment & Plan Note (Signed)
Patient with some ongoing weight loss. Appetite is limited somewhat by nausea and this may be contributing. Encouraged him to use ondansetron as needed. Will check TSH, CBC, CMP with labs today. Will also start Remeron to help with both sleep and appetite. Followup 3 months or sooner as needed.

## 2012-04-29 NOTE — Progress Notes (Signed)
Subjective:    Patient ID: Gerald Cooke, male    DOB: Jun 06, 1941, 71 y.o.   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 He reports he is generally doing well. He continues to be the primary care provider for his wife. He notably has lost approximately 16 pounds since December of 2012. He notes his appetite has been diminished somewhat and he complains of intermittent nausea. He occasionally uses ondansetron for this. He denies any abdominal pain, change in ostomy output. He denies any cough, shortness of breath. Notably, he recently had abdominal aortic aneurysm repair evaluated by ultrasound and this demonstrated stability of his graft. He notes that his energy level is slightly diminished. He notes he sometimes has difficulty sleeping.  Outpatient Encounter Prescriptions as of 04/29/2012  Medication Sig Dispense Refill  . aspirin EC 325 MG tablet Take 325 mg by mouth daily.        Marland Kitchen diltiazem (CARDIZEM CD) 120 MG 24 hr capsule Take 1 capsule (120 mg total) by mouth daily.  30 capsule  6  . metoprolol tartrate (LOPRESSOR) 25 MG tablet Take 1 tablet (25 mg total) by mouth 2 (two) times daily.  60 tablet  6  . ondansetron (ZOFRAN) 8 MG tablet Take 8 mg by mouth every 6 (six) hours as needed.      . ranitidine (ZANTAC) 150 MG tablet Take 150 mg by mouth 2 (two) times daily.        . sevelamer (RENVELA) 800 MG tablet Take 800 mg by mouth 3 (three) times daily with meals.        . simvastatin (ZOCOR) 20 MG tablet Take 20 mg by mouth every evening.      . cetirizine (ZYRTEC) 10 MG tablet Take 10 mg by mouth daily.        . mirtazapine (REMERON) 15 MG tablet Take 1 tablet (15 mg total) by mouth at bedtime.  30 tablet  3  . DISCONTD: fish oil-omega-3 fatty acids 1000 MG capsule Take 2 g by mouth daily.         BP 120/70   Pulse 75  Temp 98.6 F (37 C) (Oral)  Ht 5\' 7"  (1.702 m)  Wt 140 lb 8 oz (63.73 kg)  BMI 22.01 kg/m2  SpO2 97%  Review of Systems  Constitutional: Negative for fever, chills, activity change, appetite change, fatigue and unexpected weight change.  Eyes: Negative for visual disturbance.  Respiratory: Negative for cough and shortness of breath.   Cardiovascular: Negative for chest pain, palpitations and leg swelling.  Gastrointestinal: Positive for nausea. Negative for abdominal pain and abdominal distention.  Genitourinary: Negative for dysuria, urgency and difficulty urinating.  Musculoskeletal: Negative for arthralgias and gait problem.  Skin: Negative for color change and rash.  Hematological: Negative for adenopathy.  Psychiatric/Behavioral: Positive for disturbed wake/sleep cycle. Negative for dysphoric mood. The patient is not nervous/anxious.        Objective:   Physical Exam  Constitutional: He is oriented to person, place, and time. He appears well-developed and well-nourished. No distress.  HENT:  Head: Normocephalic and atraumatic.  Right Ear: External ear normal.  Left Ear: External ear normal.  Nose: Nose normal.  Mouth/Throat: Oropharynx is clear and moist. No oropharyngeal exudate.  Eyes: Conjunctivae normal and EOM are normal. Pupils are equal, round, and reactive to light. Right eye exhibits  no discharge. Left eye exhibits no discharge. No scleral icterus.  Neck: Normal range of motion. Neck supple. No tracheal deviation present. No thyromegaly present.  Cardiovascular: Normal rate, regular rhythm and normal heart sounds.  Exam reveals no gallop and no friction rub.   No murmur heard. Pulmonary/Chest: Effort normal and breath sounds normal. No respiratory distress. He has no wheezes. He has no rales. He exhibits no tenderness.  Abdominal: Soft. Bowel sounds are normal. He exhibits no distension. There is no tenderness.    Musculoskeletal: Normal range of motion.  He exhibits no edema.  Lymphadenopathy:    He has no cervical adenopathy.  Neurological: He is alert and oriented to person, place, and time. No cranial nerve deficit. Coordination normal.  Skin: Skin is warm and dry. No rash noted. He is not diaphoretic. No erythema. No pallor.  Psychiatric: He has a normal mood and affect. His behavior is normal. Judgment and thought content normal.          Assessment & Plan:

## 2012-04-29 NOTE — Assessment & Plan Note (Signed)
Status post chemotherapy and radiation. Will request records on last visit with oncologist as no followup has been scheduled. Question if he will need yearly CT of the chest.

## 2012-04-29 NOTE — Telephone Encounter (Signed)
CALL REPORT: Critical Lab Creatine 5.02 GFR 12.19. Results printed and given to Dr. Lorin Picket.

## 2012-04-29 NOTE — Telephone Encounter (Signed)
Pt is on dialysis. Cr is stable.

## 2012-04-30 ENCOUNTER — Telehealth: Payer: Self-pay | Admitting: Internal Medicine

## 2012-04-30 NOTE — Telephone Encounter (Signed)
Reviewed notes from Dr. Koleen Nimrod. Pt needs to have follow up scheduled. Probably yearly, however not clear from notes. I would recommend that he schedule an appointment.

## 2012-04-30 NOTE — Telephone Encounter (Signed)
Pt called checking on referral to dr Koleen Nimrod Please advise

## 2012-04-30 NOTE — Telephone Encounter (Signed)
Is that an appt with you Dr. Dan Humphreys or with Dr. Koleen Nimrod?

## 2012-04-30 NOTE — Telephone Encounter (Signed)
Patient advised as instructed via telephone, he would like [place] to schedule appt for him with Dr. Koleen Nimrod.  I will ask Erie Noe to set up appt.

## 2012-04-30 NOTE — Telephone Encounter (Signed)
I am sorry that was not clear. He needs follow up with Dr. Koleen Nimrod

## 2012-05-01 NOTE — Telephone Encounter (Signed)
Patient stopped by office I didn't see referral but did read phone notes. I called [facility] and spoke with Trula Ore she is going to look patient up and give me a call back with appointment.I did advise her that the patient was here in our office waiting to hear back from her.

## 2012-05-08 ENCOUNTER — Ambulatory Visit: Payer: Self-pay | Admitting: Oncology

## 2012-05-14 ENCOUNTER — Telehealth: Payer: Self-pay | Admitting: Internal Medicine

## 2012-05-14 NOTE — Telephone Encounter (Signed)
Patient has an appointment with Dr. Koleen Nimrod on 10.29.13.

## 2012-05-20 LAB — COMPREHENSIVE METABOLIC PANEL
Albumin: 2.9 g/dL — ABNORMAL LOW (ref 3.4–5.0)
Alkaline Phosphatase: 74 U/L (ref 50–136)
BUN: 29 mg/dL — ABNORMAL HIGH (ref 7–18)
Chloride: 99 mmol/L (ref 98–107)
EGFR (African American): 14 — ABNORMAL LOW
Glucose: 129 mg/dL — ABNORMAL HIGH (ref 65–99)
Osmolality: 289 (ref 275–301)
SGOT(AST): 26 U/L (ref 15–37)
SGPT (ALT): 29 U/L (ref 12–78)
Total Protein: 8 g/dL (ref 6.4–8.2)

## 2012-05-20 LAB — CBC CANCER CENTER
Basophil %: 2.5 %
Eosinophil %: 4.8 %
Lymphocyte #: 0.5 x10 3/mm — ABNORMAL LOW (ref 1.0–3.6)
Lymphocyte %: 6.5 %
Monocyte %: 13 %
Neutrophil #: 5.3 x10 3/mm (ref 1.4–6.5)
Neutrophil %: 73.2 %
Platelet: 267 x10 3/mm (ref 150–440)
RDW: 14.8 % — ABNORMAL HIGH (ref 11.5–14.5)
WBC: 7.3 x10 3/mm (ref 3.8–10.6)

## 2012-05-23 ENCOUNTER — Ambulatory Visit: Payer: Self-pay | Admitting: Oncology

## 2012-06-19 ENCOUNTER — Inpatient Hospital Stay: Payer: Self-pay | Admitting: Internal Medicine

## 2012-06-19 LAB — CBC
HCT: 33.7 % — ABNORMAL LOW (ref 40.0–52.0)
HGB: 11.9 g/dL — ABNORMAL LOW (ref 13.0–18.0)
MCH: 33.9 pg (ref 26.0–34.0)
MCV: 96 fL (ref 80–100)
RBC: 3.51 10*6/uL — ABNORMAL LOW (ref 4.40–5.90)
WBC: 8.1 10*3/uL (ref 3.8–10.6)

## 2012-06-19 LAB — BASIC METABOLIC PANEL
Anion Gap: 8 (ref 7–16)
BUN: 26 mg/dL — ABNORMAL HIGH (ref 7–18)
Chloride: 104 mmol/L (ref 98–107)
Co2: 27 mmol/L (ref 21–32)
Osmolality: 282 (ref 275–301)
Potassium: 4.4 mmol/L (ref 3.5–5.1)
Sodium: 139 mmol/L (ref 136–145)

## 2012-06-19 LAB — PROTIME-INR: Prothrombin Time: 13 secs (ref 11.5–14.7)

## 2012-06-20 LAB — COMPREHENSIVE METABOLIC PANEL
Alkaline Phosphatase: 66 U/L (ref 50–136)
Bilirubin,Total: 0.3 mg/dL (ref 0.2–1.0)
Co2: 25 mmol/L (ref 21–32)
Creatinine: 5.5 mg/dL — ABNORMAL HIGH (ref 0.60–1.30)
EGFR (Non-African Amer.): 10 — ABNORMAL LOW
Osmolality: 284 (ref 275–301)
Potassium: 4.4 mmol/L (ref 3.5–5.1)
SGPT (ALT): 13 U/L (ref 12–78)
Sodium: 136 mmol/L (ref 136–145)

## 2012-06-20 LAB — CBC WITH DIFFERENTIAL/PLATELET
Basophil #: 0 10*3/uL (ref 0.0–0.1)
Eosinophil #: 0.4 10*3/uL (ref 0.0–0.7)
HGB: 10.5 g/dL — ABNORMAL LOW (ref 13.0–18.0)
Lymphocyte %: 3.7 %
MCH: 32.2 pg (ref 26.0–34.0)
MCHC: 33.5 g/dL (ref 32.0–36.0)
Monocyte #: 0.8 x10 3/mm (ref 0.2–1.0)
Neutrophil %: 82.5 %
Platelet: 225 10*3/uL (ref 150–440)
WBC: 9.1 10*3/uL (ref 3.8–10.6)

## 2012-06-22 ENCOUNTER — Ambulatory Visit: Payer: Self-pay | Admitting: Oncology

## 2012-07-01 ENCOUNTER — Other Ambulatory Visit: Payer: Self-pay

## 2012-07-01 ENCOUNTER — Ambulatory Visit: Payer: Self-pay | Admitting: Oncology

## 2012-07-01 MED ORDER — DILTIAZEM HCL ER COATED BEADS 120 MG PO CP24: 120.0000 mg | ORAL_CAPSULE | Freq: Every day | ORAL | Status: DC

## 2012-07-01 NOTE — Telephone Encounter (Signed)
Refill sent for diltiazem

## 2012-07-10 ENCOUNTER — Ambulatory Visit: Payer: Self-pay | Admitting: Internal Medicine

## 2012-07-13 LAB — BRONCHIAL WASH CULTURE

## 2012-07-17 ENCOUNTER — Encounter: Payer: Self-pay | Admitting: Internal Medicine

## 2012-07-17 ENCOUNTER — Telehealth: Payer: Self-pay | Admitting: Internal Medicine

## 2012-07-17 ENCOUNTER — Ambulatory Visit (INDEPENDENT_AMBULATORY_CARE_PROVIDER_SITE_OTHER): Payer: Medicare Other | Admitting: Internal Medicine

## 2012-07-17 VITALS — BP 140/80 | HR 76 | Temp 98.1°F | Resp 16 | Wt 136.5 lb

## 2012-07-17 DIAGNOSIS — R634 Abnormal weight loss: Secondary | ICD-10-CM

## 2012-07-17 DIAGNOSIS — R404 Transient alteration of awareness: Secondary | ICD-10-CM

## 2012-07-17 DIAGNOSIS — N185 Chronic kidney disease, stage 5: Secondary | ICD-10-CM

## 2012-07-17 DIAGNOSIS — R042 Hemoptysis: Secondary | ICD-10-CM

## 2012-07-17 DIAGNOSIS — R410 Disorientation, unspecified: Secondary | ICD-10-CM

## 2012-07-17 DIAGNOSIS — J189 Pneumonia, unspecified organism: Secondary | ICD-10-CM

## 2012-07-17 DIAGNOSIS — Z85118 Personal history of other malignant neoplasm of bronchus and lung: Secondary | ICD-10-CM

## 2012-07-17 DIAGNOSIS — Z1331 Encounter for screening for depression: Secondary | ICD-10-CM

## 2012-07-17 NOTE — Progress Notes (Signed)
Subjective:    Patient ID: Gerald Cooke, male    DOB: [birth year], 71 y.o.   MRN: [id number]  HPI 71 year old male with history of lung cancer status post resection and chemotherapy, abdominal aortic aneurysm status post rupture and repair complicated by bowel necrosis and renal failure now on hemodialysis presents for followup. He presents with both his daughter and son. He reports significant decline in his health over the last few months. They have noted some periods of confusion. Patient was having persistent cough and was having some episodes of hemoptysis. He was evaluated in the emergency room and had CT of the chest which showed findings concerning for malignancy. He reports he underwent bronchoscopy which showed pneumonia. He was recently placed on Levaquin. He has been taking this every day. Since starting the medicine, his family has noted worsening confusion. They're concerned because he is having difficulty functioning in providing care for his wife. He is the primary caregiver for his wife who has dementia. He also has ongoing weight loss. He denies any change in ostomy output. He tries C. healthy diet. He denies any problems with dialysis. He reports his fistula is functioning well. He has been off his aspirin because of the hemoptysis.  Outpatient Encounter Prescriptions as of 07/17/2012  Medication Sig Dispense Refill  . atorvastatin (LIPITOR) 40 MG tablet       . diltiazem (CARDIZEM CD) 120 MG 24 hr capsule Take 1 capsule (120 mg total) by mouth daily.  30 capsule  6  . levofloxacin (LEVAQUIN) 500 MG tablet       . metoprolol tartrate (LOPRESSOR) 25 MG tablet Take 1 tablet (25 mg total) by mouth 2 (two) times daily.  60 tablet  6  . mirtazapine (REMERON) 15 MG tablet Take 1 tablet (15 mg total) by mouth at bedtime.  30 tablet  3  . ondansetron (ZOFRAN) 8 MG tablet Take 8 mg by mouth every 6 (six) hours as needed.      . ranitidine (ZANTAC) 150 MG tablet Take 150 mg by mouth 2 (two)  times daily.        . sevelamer (RENVELA) 800 MG tablet Take 800 mg by mouth 3 (three) times daily with meals.        . simvastatin (ZOCOR) 20 MG tablet Take 20 mg by mouth every evening.      . [DISCONTINUED] aspirin EC 325 MG tablet Take 325 mg by mouth daily.        . [DISCONTINUED] diltiazem (DILACOR XR) 120 MG 24 hr capsule       . cetirizine (ZYRTEC) 10 MG tablet Take 10 mg by mouth daily.         BP 140/80  Pulse 76  Temp 98.1 F (36.7 C) (Oral)  Resp 16  Wt 136 lb 8 oz (61.916 kg)  SpO2 99%  Review of Systems  Constitutional: Positive for chills, diaphoresis, fatigue and unexpected weight change. Negative for fever, activity change and appetite change.  Eyes: Negative for visual disturbance.  Respiratory: Positive for cough. Negative for shortness of breath.   Cardiovascular: Negative for chest pain, palpitations and leg swelling.  Gastrointestinal: Negative for nausea, abdominal pain, diarrhea, blood in stool and abdominal distention.  Genitourinary: Negative for dysuria, urgency and difficulty urinating.  Musculoskeletal: Negative for arthralgias and gait problem.  Skin: Negative for color change and rash.  Hematological: Negative for adenopathy.  Psychiatric/Behavioral: Positive for confusion and dysphoric mood. Negative for sleep disturbance. The patient is not nervous/anxious.  Objective:   Physical Exam  Constitutional: He is oriented to person, place, and time. He appears well-developed and well-nourished. No distress.  HENT:  Head: Normocephalic and atraumatic.  Right Ear: External ear normal.  Left Ear: External ear normal.  Nose: Nose normal.  Mouth/Throat: Oropharynx is clear and moist. No oropharyngeal exudate.  Eyes: Conjunctivae normal and EOM are normal. Pupils are equal, round, and reactive to light. Right eye exhibits no discharge. Left eye exhibits no discharge. No scleral icterus.  Neck: Normal range of motion. Neck supple. No tracheal deviation  present. No thyromegaly present.  Cardiovascular: Normal rate, regular rhythm and normal heart sounds.  Exam reveals no gallop and no friction rub.   No murmur heard. Pulmonary/Chest: Effort normal and breath sounds normal. No respiratory distress. He has no wheezes. He has no rales. He exhibits no tenderness.  Musculoskeletal: Normal range of motion. He exhibits no edema.  Lymphadenopathy:    He has no cervical adenopathy.  Neurological: He is alert and oriented to person, place, and time. No cranial nerve deficit. Coordination normal.  Skin: Skin is warm and dry. No rash noted. He is not diaphoretic. No erythema. No pallor.  Psychiatric: He has a normal mood and affect. His behavior is normal. Judgment and thought content normal.          Assessment & Plan:  Over spent in face to face discussion with family including patient, his wife, his son and his daughter in regards to evaluation and management of hemoptysis, pneumonia, and delirium as well as plans for ongoing care.

## 2012-07-17 NOTE — Assessment & Plan Note (Signed)
Will continue HD MWF.  Will renal dose Levaquin.

## 2012-07-17 NOTE — Telephone Encounter (Signed)
Spoke with pt son about recent PET/CT findings. Recommended that they keep follow up with oncologist and ask about complete results of bronchoscopy(not available to me yet) as well as findings of increased PET uptake in stomach lining. Recommended that they ask about further evaluation with upper endoscopy which we could set up through our office or through oncology.

## 2012-07-17 NOTE — Assessment & Plan Note (Signed)
Ongoing weight loss. Given h/o lung CA, concern for recurrent disease. Pt reports recent investigation for recurrent lung cancer with his oncologist. Will request records on PET CT, CT and bronchoscopy.

## 2012-07-17 NOTE — Assessment & Plan Note (Addendum)
Patient has had recent episodes of hemoptysis thought to be related to pneumonia. Concerning for recurrent malignancy. Will request results on recent PET/CT. For now, we'll continue to refrain from use of aspirin.

## 2012-07-17 NOTE — Assessment & Plan Note (Signed)
Patient's son and daughter have noted some confusion. This may be related to recent decline in overall health with pneumonia. He would benefit from additional assistance at home as he is the primary caregiver for his wife who has progressive dementia. Will set up home health evaluation.

## 2012-07-17 NOTE — Assessment & Plan Note (Signed)
Patient reports recent diagnosis of pneumonia. He was started on Levaquin 500 mg daily. We discussed that his Levaquin will need to be dose to account for declining kidney function. He will change dosing to every other day. Will request records on recent CT, PET CT, and bronchoscopy.

## 2012-07-21 ENCOUNTER — Telehealth: Payer: Self-pay | Admitting: Internal Medicine

## 2012-07-21 NOTE — Telephone Encounter (Signed)
Pam called in and states patient refused home health care today.  She did let the son know as well.

## 2012-07-23 ENCOUNTER — Ambulatory Visit: Payer: Self-pay | Admitting: Oncology

## 2012-07-29 ENCOUNTER — Telehealth: Payer: Self-pay | Admitting: Internal Medicine

## 2012-07-29 NOTE — Telephone Encounter (Signed)
Pt's son is calling concerning a cough medicine. He said it was called in December of 2012 and he was wanting to know if it could be called in again. He is having severe coughing spells. Please call [phone]

## 2012-07-29 NOTE — Telephone Encounter (Signed)
Left message on machine at home for patient to return call. 

## 2012-07-29 NOTE — Telephone Encounter (Signed)
He should be seen an evaluated. He has a history of lung cancer.

## 2012-07-30 NOTE — Telephone Encounter (Signed)
Patient advised as instructed via telephone, patient has been seen by Dr. Koleen Nimrod.

## 2012-08-23 ENCOUNTER — Ambulatory Visit: Payer: Self-pay | Admitting: Oncology

## 2012-08-26 ENCOUNTER — Ambulatory Visit: Payer: Self-pay | Admitting: Vascular Surgery

## 2012-08-31 ENCOUNTER — Emergency Department: Payer: Self-pay | Admitting: Emergency Medicine

## 2012-08-31 LAB — BASIC METABOLIC PANEL
Anion Gap: 9 (ref 7–16)
BUN: 32 mg/dL — ABNORMAL HIGH (ref 7–18)
Calcium, Total: 9.9 mg/dL (ref 8.5–10.1)
Chloride: 102 mmol/L (ref 98–107)
Co2: 26 mmol/L (ref 21–32)
EGFR (Non-African Amer.): 10 — ABNORMAL LOW
Osmolality: 279 (ref 275–301)
Sodium: 137 mmol/L (ref 136–145)

## 2012-08-31 LAB — CBC
HCT: 34.7 % — ABNORMAL LOW (ref 40.0–52.0)
MCH: 31.9 pg (ref 26.0–34.0)
MCHC: 33.4 g/dL (ref 32.0–36.0)
MCV: 95 fL (ref 80–100)
RDW: 15.1 % — ABNORMAL HIGH (ref 11.5–14.5)
WBC: 9 10*3/uL (ref 3.8–10.6)

## 2012-09-20 ENCOUNTER — Ambulatory Visit: Payer: Self-pay | Admitting: Oncology

## 2012-09-23 ENCOUNTER — Other Ambulatory Visit: Payer: Self-pay

## 2012-09-23 ENCOUNTER — Other Ambulatory Visit: Payer: Self-pay | Admitting: Internal Medicine

## 2012-09-23 MED ORDER — METOPROLOL TARTRATE 25 MG PO TABS: 25.0000 mg | ORAL_TABLET | Freq: Two times a day (BID) | ORAL | Status: DC

## 2012-09-23 NOTE — Telephone Encounter (Signed)
Refill sent for metoprolol tart 25 mg take one tablet twice a day.  

## 2012-10-02 LAB — COMPREHENSIVE METABOLIC PANEL
Alkaline Phosphatase: 64 U/L (ref 50–136)
Anion Gap: 7 (ref 7–16)
BUN: 37 mg/dL — ABNORMAL HIGH (ref 7–18)
Co2: 29 mmol/L (ref 21–32)
Creatinine: 4.8 mg/dL — ABNORMAL HIGH (ref 0.60–1.30)
EGFR (Non-African Amer.): 11 — ABNORMAL LOW
Osmolality: 282 (ref 275–301)
Potassium: 4.8 mmol/L (ref 3.5–5.1)
SGPT (ALT): 18 U/L (ref 12–78)

## 2012-10-02 LAB — CBC CANCER CENTER
Basophil #: 0.2 x10 3/mm — ABNORMAL HIGH (ref 0.0–0.1)
Basophil %: 2.1 %
Eosinophil #: 0.2 x10 3/mm (ref 0.0–0.7)
Eosinophil %: 3.2 %
HCT: 33.9 % — ABNORMAL LOW (ref 40.0–52.0)
HGB: 11.4 g/dL — ABNORMAL LOW (ref 13.0–18.0)
Lymphocyte #: 0.4 x10 3/mm — ABNORMAL LOW (ref 1.0–3.6)
Lymphocyte %: 5.2 %
MCH: 31.9 pg (ref 26.0–34.0)
MCHC: 33.6 g/dL (ref 32.0–36.0)
Monocyte #: 0.8 x10 3/mm (ref 0.2–1.0)
Neutrophil #: 6.1 x10 3/mm (ref 1.4–6.5)
Neutrophil %: 79.2 %
Platelet: 271 x10 3/mm (ref 150–440)
WBC: 7.7 x10 3/mm (ref 3.8–10.6)

## 2012-10-21 ENCOUNTER — Encounter: Payer: Self-pay | Admitting: Cardiovascular Disease

## 2012-10-21 ENCOUNTER — Ambulatory Visit: Payer: Self-pay | Admitting: Oncology

## 2012-10-21 ENCOUNTER — Ambulatory Visit (INDEPENDENT_AMBULATORY_CARE_PROVIDER_SITE_OTHER): Payer: Medicare PPO | Admitting: Cardiovascular Disease

## 2012-10-21 VITALS — BP 138/70 | HR 72 | Ht 67.0 in | Wt 131.2 lb

## 2012-10-21 DIAGNOSIS — E785 Hyperlipidemia, unspecified: Secondary | ICD-10-CM

## 2012-10-21 DIAGNOSIS — I471 Supraventricular tachycardia: Secondary | ICD-10-CM

## 2012-10-21 DIAGNOSIS — I10 Essential (primary) hypertension: Secondary | ICD-10-CM

## 2012-10-21 DIAGNOSIS — I359 Nonrheumatic aortic valve disorder, unspecified: Secondary | ICD-10-CM

## 2012-10-21 DIAGNOSIS — I714 Abdominal aortic aneurysm, without rupture: Secondary | ICD-10-CM

## 2012-10-21 DIAGNOSIS — R634 Abnormal weight loss: Secondary | ICD-10-CM

## 2012-10-21 DIAGNOSIS — I350 Nonrheumatic aortic (valve) stenosis: Secondary | ICD-10-CM

## 2012-10-21 NOTE — Assessment & Plan Note (Signed)
Murmur on exam. Prior echocardiogram showing mild to moderate disease. No symptoms at this time

## 2012-10-21 NOTE — Assessment & Plan Note (Signed)
Currently is not taking Lipitor. Recent 10 pound weight loss. We'll wait for lab Work for 2014 before making a decision on a statin.

## 2012-10-21 NOTE — Assessment & Plan Note (Signed)
9 pound weight loss from his last clinic visit. He reports that he is eating more

## 2012-10-21 NOTE — Progress Notes (Signed)
Patient ID: Gerald Cooke, male    DOB: May 28, 1941, 72 y.o.   MRN: [id number]  HPI Comments: 72 year old male with a history of aortic aneurysm rupture in 2008 with prolonged hospitalization that required colectomy as well as nephrectomy.  on dialysis since then, previous history of cancer with chemotherapy and radiation in 2010, presenting recently to [facility] with fatigue and palpitations,   found to be tachycardic with a heart rate of 200 beats per minute due to supraventricular tachycardia,  converted to sinus rhythm with adenosine.  His cardiac enzymes were slightly elevated thought to be due to supply demand ischemia.   echocardiogram done which showed normal LV systolic function with evidence of mild to moderate aortic stenosis and a valve area of 1.4 cm.  blood pressure medications were switched from amlodipine and lisinopril to metoprolol and long-acting diltiazem.   Since then, the patient has not had any recurrent palpitations or tachycardia. He has been doing reasonably well.  He continues to do dialysis 3 days per week. His weight is down 9 pounds but he reports his appetite is now better. He is supplementing his diet with boost. he Is not taking generic Lipitor. Overall he has been doing well with no complaints. He takes care of his wife who has dementia. She helps out in the [employer] at [employer] [place].  EKG shows normal sinus rhythm with rate 72 beats per minute with right bundle branch block, left anterior fascicular block   Outpatient Encounter Prescriptions as of 10/21/2012  Medication Sig Dispense Refill  . aspirin 81 MG tablet Take 81 mg by mouth daily.      . cetirizine (ZYRTEC) 10 MG tablet Take 10 mg by mouth daily.        Marland Kitchen diltiazem (CARDIZEM CD) 120 MG 24 hr capsule Take 1 capsule (120 mg total) by mouth daily.  30 capsule  6  . metoprolol tartrate (LOPRESSOR) 25 MG tablet Take 1 tablet (25 mg total) by mouth 2 (two) times daily.  60 tablet  6   . mirtazapine (REMERON) 15 MG tablet Take 1 tablet (15 mg total) by mouth at bedtime.  30 tablet  3  . ondansetron (ZOFRAN) 8 MG tablet Take 8 mg by mouth every 6 (six) hours as needed.      . ondansetron (ZOFRAN) 8 MG tablet TAKE 1 TABLET EVERY 6 HOURS AS NEEDED  60 tablet  3  . ranitidine (ZANTAC) 150 MG tablet Take 150 mg by mouth 2 (two) times daily.        . sevelamer (RENVELA) 800 MG tablet Take 800 mg by mouth 3 (three) times daily with meals.        Marland Kitchen atorvastatin (LIPITOR) 40 MG tablet Take 40 mg by mouth daily.      . [DISCONTINUED] atorvastatin (LIPITOR) 40 MG tablet       . [DISCONTINUED] levofloxacin (LEVAQUIN) 500 MG tablet       . [DISCONTINUED] simvastatin (ZOCOR) 20 MG tablet Take 20 mg by mouth every evening.       No facility-administered encounter medications on file as of 10/21/2012.     Review of Systems  Constitutional: Negative.   HENT: Negative.   Eyes: Negative.   Respiratory: Negative.   Cardiovascular: Negative.   Gastrointestinal: Negative.   Musculoskeletal: Negative.   Skin: Negative.   Neurological: Negative.   Psychiatric/Behavioral: Negative.   All other systems reviewed and are negative.    BP 138/70  Pulse 72  Ht 5\' 7"  (1.702 m)  Wt 131 lb 4 oz (59.535 kg)  BMI 20.55 kg/m2  Physical Exam  Nursing note and vitals reviewed. Constitutional: He is oriented to person, place, and time. He appears well-developed and well-nourished.  HENT:  Head: Normocephalic.  Nose: Nose normal.  Mouth/Throat: Oropharynx is clear and moist.  Eyes: Conjunctivae are normal. Pupils are equal, round, and reactive to light.  Neck: Normal range of motion. Neck supple. No JVD present.  Cardiovascular: Normal rate, regular rhythm, S1 normal, S2 normal, normal heart sounds and intact distal pulses.  Exam reveals no gallop and no friction rub.   No murmur heard. Pulses:      Radial pulses are 0 on the right side, and 0 on the left side.       Dorsalis pedis pulses  are 0 on the right side, and 0 on the left side.       Posterior tibial pulses are 0 on the right side, and 0 on the left side.  Pulmonary/Chest: Effort normal and breath sounds normal. No respiratory distress. He has no wheezes. He has no rales. He exhibits no tenderness.  Abdominal: Soft. Bowel sounds are normal. He exhibits no distension. There is no tenderness.  Musculoskeletal: Normal range of motion. He exhibits no edema and no tenderness.  Lymphadenopathy:    He has no cervical adenopathy.  Neurological: He is alert and oriented to person, place, and time. Coordination normal.  Skin: Skin is warm and dry. No rash noted. No erythema.  Psychiatric: He has a normal mood and affect. His behavior is normal. Judgment and thought content normal.      Assessment and Plan

## 2012-10-21 NOTE — Assessment & Plan Note (Signed)
No recent episodes of tachycardia. No changes to his medications

## 2012-10-21 NOTE — Patient Instructions (Addendum)
You are doing well. No medication changes were made.  Please call us if you have new issues that need to be addressed before your next appt.  Your physician wants you to follow-up in: 6 months.  You will receive a reminder letter in the mail two months in advance. If you don't receive a letter, please call our office to schedule the follow-up appointment.   

## 2012-10-21 NOTE — Assessment & Plan Note (Signed)
Blood pressure is well controlled on today's visit. No changes made to the medications. 

## 2012-10-21 NOTE — Assessment & Plan Note (Signed)
Repeat ultrasound was stable in 2013. Repeat study in 2015

## 2012-10-23 ENCOUNTER — Ambulatory Visit: Payer: Medicare Other | Admitting: Internal Medicine

## 2012-11-10 ENCOUNTER — Ambulatory Visit (INDEPENDENT_AMBULATORY_CARE_PROVIDER_SITE_OTHER): Payer: Medicare PPO | Admitting: Internal Medicine

## 2012-11-10 ENCOUNTER — Encounter: Payer: Self-pay | Admitting: Internal Medicine

## 2012-11-10 VITALS — BP 140/80 | HR 96 | Temp 98.5°F | Wt 130.0 lb

## 2012-11-10 DIAGNOSIS — I10 Essential (primary) hypertension: Secondary | ICD-10-CM

## 2012-11-10 DIAGNOSIS — R634 Abnormal weight loss: Secondary | ICD-10-CM

## 2012-11-10 DIAGNOSIS — N185 Chronic kidney disease, stage 5: Secondary | ICD-10-CM

## 2012-11-10 DIAGNOSIS — Z85118 Personal history of other malignant neoplasm of bronchus and lung: Secondary | ICD-10-CM

## 2012-11-10 MED ORDER — ONDANSETRON HCL 8 MG PO TABS: 8.0000 mg | ORAL_TABLET | Freq: Four times a day (QID) | ORAL | Status: DC | PRN

## 2012-11-10 NOTE — Progress Notes (Signed)
Subjective:    Patient ID: Gerald Cooke, male    DOB: 1941/03/12, 72 y.o.   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 He reports things have generally been doing well. He continues to be the primary caregiver for his wife who has dementia. He continues on hemodialysis 3 times per week. He has gotten some assistance from a home with a sitter for his wife. He reports he is generally been feeling well. He has some chronic ongoing nausea and has lost another 6 pounds since his last visit. He has been trying to supplement his diet with occasional use of boost or Ensure. At last visit, we had started Remeron but he did not feel any improvement in appetite with this medication so did not continue. Ostomy has been functioning well. No new concerns today.  Outpatient Encounter Prescriptions as of 11/10/2012  Medication Sig Dispense Refill  . aspirin 81 MG tablet Take 81 mg by mouth daily.      Marland Kitchen atorvastatin (LIPITOR) 40 MG tablet Take 40 mg by mouth daily.      . cetirizine (ZYRTEC) 10 MG tablet Take 10 mg by mouth daily.        Marland Kitchen diltiazem (CARDIZEM CD) 120 MG 24 hr capsule Take 1 capsule (120 mg total) by mouth daily.  30 capsule  6  . metoprolol tartrate (LOPRESSOR) 25 MG tablet Take 1 tablet (25 mg total) by mouth 2 (two) times daily.  60 tablet  6  . ondansetron (ZOFRAN) 8 MG tablet Take 1 tablet (8 mg total) by mouth every 6 (six) hours as needed.  90 tablet  4  . ranitidine (ZANTAC) 150 MG tablet Take 150 mg by mouth 2 (two) times daily.        . sevelamer (RENVELA) 800 MG tablet Take 800 mg by mouth 3 (three) times daily with meals.         No facility-administered encounter medications on file as of 11/10/2012.   BP 140/80  Pulse 96  Temp(Src) 98.5 F (36.9 C) (Oral)  Wt 130 lb  (58.968 kg)  BMI 20.36 kg/m2  SpO2 97%  Review of Systems  Constitutional: Negative for fever, chills, activity change, appetite change, fatigue and unexpected weight change.  Eyes: Negative for visual disturbance.  Respiratory: Negative for cough and shortness of breath.   Cardiovascular: Negative for chest pain, palpitations and leg swelling.  Gastrointestinal: Negative for abdominal pain and abdominal distention.  Genitourinary: Negative for dysuria, urgency and difficulty urinating.  Musculoskeletal: Negative for arthralgias and gait problem.  Skin: Negative for color change and rash.  Hematological: Negative for adenopathy.  Psychiatric/Behavioral: Negative for sleep disturbance and dysphoric mood. The patient is not nervous/anxious.        Objective:   Physical Exam  Constitutional: He is oriented to person, place, and time. He appears well-developed and well-nourished. No distress.  HENT:  Head: Normocephalic and atraumatic.  Right Ear: External ear normal.  Left Ear: External ear normal.  Nose: Nose normal.  Mouth/Throat: Oropharynx is clear and moist. No oropharyngeal exudate.  Eyes: Conjunctivae and EOM are normal. Pupils are equal, round, and reactive to light. Right eye exhibits no discharge. Left eye exhibits no discharge. No scleral icterus.  Neck: Normal range of motion. Neck supple. No tracheal deviation present. No thyromegaly present.  Cardiovascular: Normal rate,  regular rhythm and normal heart sounds.  Exam reveals no gallop and no friction rub.   No murmur heard. Pulmonary/Chest: Effort normal and breath sounds normal. No accessory muscle usage. Not tachypneic. No respiratory distress. He has no decreased breath sounds. He has no wheezes. He has no rhonchi. He has no rales. He exhibits no tenderness.  Abdominal: Normal appearance and bowel sounds are normal.    Musculoskeletal: Normal range of motion. He exhibits no edema.  Lymphadenopathy:    He has no  cervical adenopathy.  Neurological: He is alert and oriented to person, place, and time. No cranial nerve deficit. Coordination normal.  Skin: Skin is warm and dry. No rash noted. He is not diaphoretic. No erythema. No pallor.  Psychiatric: He has a normal mood and affect. His behavior is normal. Judgment and thought content normal.          Assessment & Plan:

## 2012-11-10 NOTE — Assessment & Plan Note (Signed)
Stable on HD. Will request recent labs from dialysis.

## 2012-11-10 NOTE — Assessment & Plan Note (Signed)
Follow up scan with oncologist scheduled for 03/2013 with repeat imaging.

## 2012-11-10 NOTE — Assessment & Plan Note (Signed)
Wt Readings from Last 3 Encounters:  11/10/12 130 lb (58.968 kg)  10/21/12 131 lb 4 oz (59.535 kg)  07/17/12 136 lb 8 oz (61.916 kg)   Persistent weight loss. Pt has not been taking Remeron. Discussed resuming this today. Discussed supplementation of calories with Boost or Ensure. Will plan to check TSH and CBC with labs. Continue Ondansetron prn nausea.

## 2012-11-10 NOTE — Assessment & Plan Note (Signed)
BP Readings from Last 3 Encounters:  11/10/12 140/80  10/21/12 138/70  07/17/12 140/80   BP well controlled on current medications. Will continue.

## 2012-11-11 ENCOUNTER — Telehealth: Payer: Self-pay | Admitting: *Deleted

## 2012-11-11 ENCOUNTER — Ambulatory Visit: Payer: Medicare Other | Admitting: Internal Medicine

## 2012-11-11 NOTE — Telephone Encounter (Signed)
Gerald Cooke, Can you please start prior auth process?

## 2012-11-11 NOTE — Telephone Encounter (Signed)
Received fax from pharmacy stating they need prior approval on Ondansetron before they will be able to refill.

## 2012-11-11 NOTE — Telephone Encounter (Signed)
Called [phone] for prior authorization in the Ondansetron, form is being faxed over

## 2012-11-11 NOTE — Telephone Encounter (Signed)
Phone call with pt's son in law, Chrissie Noa, stating they picked up Rx for Remeron and pt has started on it as advised at 11/10/12 visit per Dr. Dan Humphreys. Also asked which Cardizem pt is on. Advised he is on Cardizem CD.

## 2012-11-11 NOTE — Telephone Encounter (Signed)
The number to call is [phone]

## 2012-11-13 ENCOUNTER — Telehealth: Payer: Self-pay | Admitting: *Deleted

## 2012-11-13 NOTE — Telephone Encounter (Signed)
Received a Fax from [facility], Ondansetron Hcl 8 mg tablet has been APPROVED, good until 04.23.2016

## 2012-11-14 NOTE — Telephone Encounter (Signed)
Form has been completed and faxed back for approval.

## 2012-12-17 ENCOUNTER — Encounter: Payer: Self-pay | Admitting: Emergency Medicine

## 2012-12-29 ENCOUNTER — Other Ambulatory Visit: Payer: Self-pay | Admitting: *Deleted

## 2012-12-29 MED ORDER — DILTIAZEM HCL ER COATED BEADS 120 MG PO CP24: 120.0000 mg | ORAL_CAPSULE | Freq: Every day | ORAL | Status: DC

## 2012-12-29 NOTE — Telephone Encounter (Signed)
Refilled Diltiazem sent to [facility].

## 2013-03-10 ENCOUNTER — Ambulatory Visit: Payer: Self-pay | Admitting: Vascular Surgery

## 2013-03-31 ENCOUNTER — Ambulatory Visit: Payer: Self-pay | Admitting: Oncology

## 2013-04-02 ENCOUNTER — Ambulatory Visit: Payer: Self-pay | Admitting: Oncology

## 2013-04-02 LAB — COMPREHENSIVE METABOLIC PANEL
Albumin: 2.8 g/dL — ABNORMAL LOW (ref 3.4–5.0)
Anion Gap: 10 (ref 7–16)
BUN: 28 mg/dL — ABNORMAL HIGH (ref 7–18)
Bilirubin,Total: 0.4 mg/dL (ref 0.2–1.0)
Chloride: 101 mmol/L (ref 98–107)
Creatinine: 5.14 mg/dL — ABNORMAL HIGH (ref 0.60–1.30)
Glucose: 118 mg/dL — ABNORMAL HIGH (ref 65–99)
Osmolality: 284 (ref 275–301)
Potassium: 4.5 mmol/L (ref 3.5–5.1)
SGOT(AST): 13 U/L — ABNORMAL LOW (ref 15–37)
SGPT (ALT): 13 U/L (ref 12–78)
Total Protein: 7.2 g/dL (ref 6.4–8.2)

## 2013-04-02 LAB — CBC CANCER CENTER
Basophil #: 0.1 x10 3/mm (ref 0.0–0.1)
Eosinophil #: 0.2 x10 3/mm (ref 0.0–0.7)
HGB: 11.9 g/dL — ABNORMAL LOW (ref 13.0–18.0)
Lymphocyte #: 0.3 x10 3/mm — ABNORMAL LOW (ref 1.0–3.6)
Lymphocyte %: 3.5 %
MCHC: 33.1 g/dL (ref 32.0–36.0)
MCV: 96 fL (ref 80–100)
Monocyte %: 11.2 %
Neutrophil #: 6.6 x10 3/mm — ABNORMAL HIGH (ref 1.4–6.5)
Platelet: 215 x10 3/mm (ref 150–440)
RBC: 3.76 10*6/uL — ABNORMAL LOW (ref 4.40–5.90)
WBC: 8.1 x10 3/mm (ref 3.8–10.6)

## 2013-04-16 ENCOUNTER — Ambulatory Visit (INDEPENDENT_AMBULATORY_CARE_PROVIDER_SITE_OTHER): Payer: Medicare PPO | Admitting: Cardiovascular Disease

## 2013-04-16 ENCOUNTER — Encounter: Payer: Self-pay | Admitting: Cardiovascular Disease

## 2013-04-16 VITALS — BP 160/78 | HR 71 | Ht 66.0 in | Wt 136.0 lb

## 2013-04-16 DIAGNOSIS — I471 Supraventricular tachycardia: Secondary | ICD-10-CM

## 2013-04-16 DIAGNOSIS — R0602 Shortness of breath: Secondary | ICD-10-CM

## 2013-04-16 DIAGNOSIS — N185 Chronic kidney disease, stage 5: Secondary | ICD-10-CM

## 2013-04-16 DIAGNOSIS — I359 Nonrheumatic aortic valve disorder, unspecified: Secondary | ICD-10-CM

## 2013-04-16 DIAGNOSIS — E785 Hyperlipidemia, unspecified: Secondary | ICD-10-CM

## 2013-04-16 DIAGNOSIS — I714 Abdominal aortic aneurysm, without rupture: Secondary | ICD-10-CM

## 2013-04-16 DIAGNOSIS — I350 Nonrheumatic aortic (valve) stenosis: Secondary | ICD-10-CM

## 2013-04-16 DIAGNOSIS — I10 Essential (primary) hypertension: Secondary | ICD-10-CM

## 2013-04-16 NOTE — Assessment & Plan Note (Signed)
Stable by ultrasound in 2013. Repeat ultrasound scheduled for next year

## 2013-04-16 NOTE — Progress Notes (Signed)
Patient ID: Gerald Cooke, male    DOB: December 22, 1940, 72 y.o.   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  on dialysis 3 times a week since then, previous history of cancer with chemotherapy and radiation in 2010 followed by Dr. Cliffton Asters, previous admission to [facility] with fatigue and palpitations,   found to be tachycardic with a heart rate of 200 beats per minute due to supraventricular tachycardia,  converted to sinus rhythm with adenosine.  His cardiac enzymes were slightly elevated thought to be due to supply demand ischemia.   echocardiogram done in 2013 which showed normal LV systolic function with evidence of mild to moderate aortic stenosis and a valve area of 1.4 cm.  blood pressure medications were switched from amlodipine and lisinopril to metoprolol and long-acting diltiazem.  He reports that his blood pressure has been elevated at home, typically in the 160 range systolic He otherwise feels well. Is somewhat sedentary. Recent upper respiratory infection and given a Z-Pak by Dr. Cliffton Asters.  He has not been taking his Lipitor for uncertain reasons  not had any recurrent palpitations or tachycardia. He has been doing reasonably well.  He continues to do dialysis 3 days per week.   EKG shows normal sinus rhythm with rate 71 beats per minute with right bundle branch block, left anterior fascicular block, nonspecific ST abnormality   Outpatient Encounter Prescriptions as of 04/16/2013  Medication Sig Dispense Refill  . aspirin 81 MG tablet Take 81 mg by mouth daily.      . cetirizine (ZYRTEC) 10 MG tablet Take 10 mg by mouth daily as needed.       . diltiazem (CARDIZEM CD) 120 MG 24 hr capsule Take 1 capsule (120 mg total) by mouth daily.  30 capsule  6  . metoprolol tartrate (LOPRESSOR) 25 MG tablet Take 25 mg  by mouth daily.      . mirtazapine (REMERON) 15 MG tablet Take 1 tablet (15 mg total) by mouth at bedtime.  30 tablet  3  . ondansetron (ZOFRAN) 8 MG tablet Take 1 tablet (8 mg total) by mouth every 6 (six) hours as needed.  90 tablet  4  . ranitidine (ZANTAC) 150 MG tablet Take 150 mg by mouth 2 (two) times daily.        . sevelamer (RENVELA) 800 MG tablet Take 800 mg by mouth 3 (three) times daily with meals.        . [DISCONTINUED] metoprolol tartrate (LOPRESSOR) 25 MG tablet Take 1 tablet (25 mg total) by mouth 2 (two) times daily.  60 tablet  6  . [DISCONTINUED] atorvastatin (LIPITOR) 40 MG tablet Take 40 mg by mouth daily.       No facility-administered encounter medications on file as of 04/16/2013.     Review of Systems  Constitutional: Negative.   HENT: Negative.   Eyes: Negative.   Respiratory: Negative.   Cardiovascular: Negative.   Gastrointestinal: Negative.   Endocrine: Negative.   Musculoskeletal: Negative.   Skin: Negative.   Allergic/Immunologic: Negative.   Neurological: Negative.   Hematological: Negative.   Psychiatric/Behavioral: Negative.   All other systems reviewed and are negative.    BP 160/78  Pulse 71  Ht 5\' 6"  (1.676 m)  Wt 136 lb (61.689 kg)  BMI 21.96 kg/m2  Physical Exam  Nursing note and vitals reviewed. Constitutional:  He is oriented to person, place, and time. He appears well-developed and well-nourished.  HENT:  Head: Normocephalic.  Nose: Nose normal.  Mouth/Throat: Oropharynx is clear and moist.  Eyes: Conjunctivae are normal. Pupils are equal, round, and reactive to light.  Neck: Normal range of motion. Neck supple. No JVD present.  Cardiovascular: Normal rate, regular rhythm, S1 normal and S2 normal.  Exam reveals decreased pulses. Exam reveals no gallop and no friction rub.   Murmur heard.  Systolic murmur is present with a grade of 2/6  Pulmonary/Chest: Effort normal and breath sounds normal. No respiratory distress. He has no  wheezes. He has no rales. He exhibits no tenderness.  Abdominal: Soft. Bowel sounds are normal. He exhibits no distension. There is no tenderness.  Musculoskeletal: Normal range of motion. He exhibits no edema and no tenderness.  Lymphadenopathy:    He has no cervical adenopathy.  Neurological: He is alert and oriented to person, place, and time. Coordination normal.  Skin: Skin is warm and dry. No rash noted. No erythema.  Psychiatric: He has a normal mood and affect. His behavior is normal. Judgment and thought content normal.      Assessment and Plan

## 2013-04-16 NOTE — Assessment & Plan Note (Signed)
On hemodialysis 3 times per week. Still makes urine

## 2013-04-16 NOTE — Assessment & Plan Note (Signed)
Mild to moderate aortic valve stenosis by echocardiogram last year. Consider repeat echocardiogram in 2015. Appears symptomatic

## 2013-04-16 NOTE — Assessment & Plan Note (Signed)
Suggested he restart his Lipitor. Denies having significant symptoms/side effects

## 2013-04-16 NOTE — Assessment & Plan Note (Signed)
Blood pressure is elevated. We will ask Dr. Cherylann Ratel if he would be okay with starting lisinopril.

## 2013-04-16 NOTE — Patient Instructions (Addendum)
You are doing well.  We will ask Dr. Cherylann Ratel about starting lisinopril for blood pressure Please restart atorvastatin 1/2 pill going up to a full pill  Please call us if you have new issues that need to be addressed before your next appt.  Your physician wants you to follow-up in: 6 months.  You will receive a reminder letter in the mail two months in advance. If you don't receive a letter, please call our office to schedule the follow-up appointment.

## 2013-04-22 ENCOUNTER — Ambulatory Visit: Payer: Self-pay | Admitting: Oncology

## 2013-05-18 ENCOUNTER — Ambulatory Visit: Payer: Medicare PPO | Admitting: Internal Medicine

## 2013-05-29 ENCOUNTER — Inpatient Hospital Stay: Payer: Self-pay | Admitting: Internal Medicine

## 2013-05-29 ENCOUNTER — Ambulatory Visit: Payer: Self-pay | Admitting: Oncology

## 2013-05-29 LAB — URINALYSIS, COMPLETE
Bilirubin,UR: NEGATIVE
Ketone: NEGATIVE
Leukocyte Esterase: NEGATIVE
Nitrite: NEGATIVE
Ph: 9 (ref 4.5–8.0)
Protein: 100
RBC,UR: 1 /HPF (ref 0–5)
Specific Gravity: 1.012 (ref 1.003–1.030)
Squamous Epithelial: NONE SEEN
WBC UR: <1 /HPF (ref 0–5)

## 2013-05-29 LAB — CBC
HCT: 31.5 % — ABNORMAL LOW (ref 40.0–52.0)
MCHC: 34 g/dL (ref 32.0–36.0)
MCV: 98 fL (ref 80–100)
WBC: 7 10*3/uL (ref 3.8–10.6)

## 2013-05-29 LAB — PRO B NATRIURETIC PEPTIDE: B-Type Natriuretic Peptide: 23611 pg/mL — ABNORMAL HIGH (ref 0–125)

## 2013-05-29 LAB — COMPREHENSIVE METABOLIC PANEL
Albumin: 3.4 g/dL (ref 3.4–5.0)
Anion Gap: 4 — ABNORMAL LOW (ref 7–16)
Calcium, Total: 9.3 mg/dL (ref 8.5–10.1)
Co2: 26 mmol/L (ref 21–32)
Creatinine: 5.65 mg/dL — ABNORMAL HIGH (ref 0.60–1.30)
EGFR (Non-African Amer.): 9 — ABNORMAL LOW
Glucose: 102 mg/dL — ABNORMAL HIGH (ref 65–99)
Osmolality: 280 (ref 275–301)
SGOT(AST): 18 U/L (ref 15–37)
SGPT (ALT): 22 U/L (ref 12–78)
Sodium: 136 mmol/L (ref 136–145)

## 2013-05-30 LAB — COMPREHENSIVE METABOLIC PANEL
Albumin: 3 g/dL — ABNORMAL LOW (ref 3.4–5.0)
Alkaline Phosphatase: 77 U/L (ref 50–136)
Anion Gap: 5 — ABNORMAL LOW (ref 7–16)
BUN: 26 mg/dL — ABNORMAL HIGH (ref 7–18)
Bilirubin,Total: 0.4 mg/dL (ref 0.2–1.0)
Calcium, Total: 9.4 mg/dL (ref 8.5–10.1)
Chloride: 99 mmol/L (ref 98–107)
Co2: 32 mmol/L (ref 21–32)
Creatinine: 4.14 mg/dL — ABNORMAL HIGH (ref 0.60–1.30)
EGFR (African American): 16 — ABNORMAL LOW
Osmolality: 280 (ref 275–301)
Potassium: 4.9 mmol/L (ref 3.5–5.1)
SGPT (ALT): 19 U/L (ref 12–78)
Sodium: 136 mmol/L (ref 136–145)
Total Protein: 7.1 g/dL (ref 6.4–8.2)

## 2013-05-30 LAB — PROTIME-INR
INR: 1.1
Prothrombin Time: 13.9 secs (ref 11.5–14.7)

## 2013-05-30 LAB — MAGNESIUM: Magnesium: 2.1 mg/dL

## 2013-05-30 LAB — CBC WITH DIFFERENTIAL/PLATELET
Basophil %: 0.6 %
Eosinophil #: 0 10*3/uL (ref 0.0–0.7)
HGB: 11 g/dL — ABNORMAL LOW (ref 13.0–18.0)
Lymphocyte #: 0.2 10*3/uL — ABNORMAL LOW (ref 1.0–3.6)
Lymphocyte %: 2.2 %
MCV: 97 fL (ref 80–100)
Monocyte #: 0.3 x10 3/mm (ref 0.2–1.0)
Neutrophil #: 9.7 10*3/uL — ABNORMAL HIGH (ref 1.4–6.5)
Platelet: 199 10*3/uL (ref 150–440)
RBC: 3.26 10*6/uL — ABNORMAL LOW (ref 4.40–5.90)
RDW: 15.2 % — ABNORMAL HIGH (ref 11.5–14.5)

## 2013-06-01 LAB — RENAL FUNCTION PANEL
Albumin: 2.9 g/dL — ABNORMAL LOW (ref 3.4–5.0)
Calcium, Total: 8.5 mg/dL (ref 8.5–10.1)
Chloride: 104 mmol/L (ref 98–107)
Creatinine: 6.84 mg/dL — ABNORMAL HIGH (ref 0.60–1.30)
EGFR (African American): 9 — ABNORMAL LOW
EGFR (Non-African Amer.): 7 — ABNORMAL LOW
Osmolality: 297 (ref 275–301)
Phosphorus: 3.8 mg/dL (ref 2.5–4.9)
Potassium: 5.5 mmol/L — ABNORMAL HIGH (ref 3.5–5.1)
Sodium: 135 mmol/L — ABNORMAL LOW (ref 136–145)

## 2013-06-01 LAB — CBC WITH DIFFERENTIAL/PLATELET
Basophil #: 0 10*3/uL (ref 0.0–0.1)
Basophil %: 0.2 %
Eosinophil #: 0.1 10*3/uL (ref 0.0–0.7)
HCT: 28.7 % — ABNORMAL LOW (ref 40.0–52.0)
HGB: 9.9 g/dL — ABNORMAL LOW (ref 13.0–18.0)
Lymphocyte #: 0.4 10*3/uL — ABNORMAL LOW (ref 1.0–3.6)
Lymphocyte %: 4.4 %
MCH: 33.7 pg (ref 26.0–34.0)
MCV: 97 fL (ref 80–100)
Monocyte #: 1.2 x10 3/mm — ABNORMAL HIGH (ref 0.2–1.0)
Monocyte %: 12.4 %
WBC: 9.5 10*3/uL (ref 3.8–10.6)

## 2013-06-02 LAB — BASIC METABOLIC PANEL
Anion Gap: 4 — ABNORMAL LOW (ref 7–16)
BUN: 34 mg/dL — ABNORMAL HIGH (ref 7–18)
Calcium, Total: 8.5 mg/dL (ref 8.5–10.1)
Co2: 32 mmol/L (ref 21–32)
EGFR (African American): 17 — ABNORMAL LOW
Glucose: 81 mg/dL (ref 65–99)
Osmolality: 286 (ref 275–301)
Potassium: 4.6 mmol/L (ref 3.5–5.1)

## 2013-06-02 LAB — CBC WITH DIFFERENTIAL/PLATELET
Eosinophil #: 0.2 10*3/uL (ref 0.0–0.7)
Eosinophil %: 2.5 %
HCT: 30.8 % — ABNORMAL LOW (ref 40.0–52.0)
HGB: 10.5 g/dL — ABNORMAL LOW (ref 13.0–18.0)
Lymphocyte #: 0.5 10*3/uL — ABNORMAL LOW (ref 1.0–3.6)
Lymphocyte %: 6.8 %
MCH: 33.2 pg (ref 26.0–34.0)
MCHC: 34 g/dL (ref 32.0–36.0)
MCV: 98 fL (ref 80–100)
Monocyte %: 14.9 %
Neutrophil #: 5.3 10*3/uL (ref 1.4–6.5)
RBC: 3.15 10*6/uL — ABNORMAL LOW (ref 4.40–5.90)
RDW: 15.2 % — ABNORMAL HIGH (ref 11.5–14.5)

## 2013-06-03 LAB — CULTURE, BLOOD (SINGLE)

## 2013-06-11 ENCOUNTER — Ambulatory Visit: Payer: Self-pay | Admitting: Oncology

## 2013-06-11 ENCOUNTER — Ambulatory Visit: Payer: Medicare PPO | Admitting: Internal Medicine

## 2013-06-15 ENCOUNTER — Ambulatory Visit: Payer: Self-pay | Admitting: Oncology

## 2013-06-22 ENCOUNTER — Ambulatory Visit: Payer: Self-pay | Admitting: Oncology

## 2013-07-01 ENCOUNTER — Ambulatory Visit: Payer: Self-pay | Admitting: Oncology

## 2013-07-14 ENCOUNTER — Ambulatory Visit: Payer: Medicare PPO | Admitting: Internal Medicine

## 2013-07-23 ENCOUNTER — Ambulatory Visit: Payer: Self-pay | Admitting: Oncology

## 2013-07-28 ENCOUNTER — Other Ambulatory Visit: Payer: Self-pay | Admitting: Cardiovascular Disease

## 2013-07-28 ENCOUNTER — Encounter: Payer: Self-pay | Admitting: Internal Medicine

## 2013-07-28 ENCOUNTER — Ambulatory Visit (INDEPENDENT_AMBULATORY_CARE_PROVIDER_SITE_OTHER): Payer: Medicare Other | Admitting: Internal Medicine

## 2013-07-28 VITALS — BP 130/60 | HR 88 | Temp 98.1°F | Wt 136.0 lb

## 2013-07-28 DIAGNOSIS — Z85118 Personal history of other malignant neoplasm of bronchus and lung: Secondary | ICD-10-CM

## 2013-07-28 DIAGNOSIS — I10 Essential (primary) hypertension: Secondary | ICD-10-CM

## 2013-07-28 DIAGNOSIS — I471 Supraventricular tachycardia: Secondary | ICD-10-CM

## 2013-07-28 DIAGNOSIS — R634 Abnormal weight loss: Secondary | ICD-10-CM

## 2013-07-28 DIAGNOSIS — N185 Chronic kidney disease, stage 5: Secondary | ICD-10-CM

## 2013-07-28 MED ORDER — GUAIFENESIN-CODEINE 100-10 MG/5ML PO SYRP: 5.0000 mL | ORAL_SOLUTION | Freq: Two times a day (BID) | ORAL | Status: DC | PRN

## 2013-07-28 NOTE — Progress Notes (Signed)
Pre-visit discussion using our clinic review tool. No additional management support is needed unless otherwise documented below in the visit note.  

## 2013-07-29 NOTE — Assessment & Plan Note (Signed)
Question of recurrent disease on recent CT chest. Pt has d/w his oncologist. Plan for repeat Chest CT in 10/2013.

## 2013-07-29 NOTE — Assessment & Plan Note (Signed)
Wt Readings from Last 3 Encounters:  07/28/13 136 lb (61.689 kg)  04/16/13 136 lb (61.689 kg)  11/10/12 130 lb (58.968 kg)   Weight has recently been stable. Encouraged pt to use Boost to supplement nutrition as tolerated.

## 2013-07-29 NOTE — Assessment & Plan Note (Signed)
BP Readings from Last 3 Encounters:  07/28/13 130/60  04/16/13 160/78  11/10/12 140/80   BP well controlled on current medications. Will continue.

## 2013-07-29 NOTE — Assessment & Plan Note (Signed)
Tolerating HD well. He will discuss concerns about dry weight with his nephrologist.

## 2013-07-29 NOTE — Assessment & Plan Note (Signed)
Symptomatically doing well with no findings to suggest recurrent SVT. Continue Diltiazem and Metoprolol.

## 2013-07-29 NOTE — Progress Notes (Signed)
Subjective:    Patient ID: Gerald Cooke, male    DOB: January 20, 1941, 73 y.o.   MRN: [id number]  HPI 73 year old male with history of lung cancer, end-stage renal disease on hemodialysis, paroxysmal SVT currently controlled with metoprolol and diltiazem presents for followup. He reports that he is generally feeling well. He had an episode in November 2014 in which he developed extreme shortness of breath. He was evaluated in the emergency room and noted to have initial sats of 84% on room air. Shortness of breath was thought secondary to volume overload. Symptoms improved with urgent hemodialysis. At that time, he also had a chest CT which suggested recurrence of his lung cancer. However, he reports that his oncologist felt that the new areas were secondary to scar tissue. He has followup scheduled in April for repeat CT scan. He is generally feeling well. He occasionally has mild shortness of breath and cough mostly prior to hemodialysis. He plans to talk to his nephrologist about adjusting his try weight.  His wife was recently placed in a long-term skilled nursing facility for dementia. He reports that he misses her and this has been a difficult transition. He tries to see her every day or every other day.  Outpatient Encounter Prescriptions as of 07/28/2013  Medication Sig  . aspirin 81 MG tablet Take 81 mg by mouth daily.  . cetirizine (ZYRTEC) 10 MG tablet Take 10 mg by mouth daily as needed.   . diltiazem (CARDIZEM CD) 120 MG 24 hr capsule TAKE ONE CAPSULE DAILY  . Ipratropium-Albuterol (COMBIVENT RESPIMAT) 20-100 MCG/ACT AERS respimat Inhale 1 puff into the lungs as needed for wheezing. Every 6 hours as needed  . metoprolol tartrate (LOPRESSOR) 25 MG tablet Take 25 mg by mouth daily.  . ondansetron (ZOFRAN) 8 MG tablet Take 1 tablet (8 mg total) by mouth every 6 (six) hours as needed.  . ranitidine (ZANTAC) 150 MG tablet Take 150 mg by mouth 2 (two) times daily.    . sevelamer (RENVELA) 800  MG tablet Take 800 mg by mouth 3 (three) times daily with meals.    Marland Kitchen atorvastatin (LIPITOR) 40 MG tablet   . guaiFENesin-codeine (ROBITUSSIN AC) 100-10 MG/5ML syrup Take 5 mLs by mouth 2 (two) times daily as needed for cough.  . mirtazapine (REMERON) 15 MG tablet Take 1 tablet (15 mg total) by mouth at bedtime.   BP 130/60  Pulse 88  Temp(Src) 98.1 F (36.7 C) (Oral)  Wt 136 lb (61.689 kg)  SpO2 99%  Review of Systems  Constitutional: Negative for fever, chills, activity change, appetite change, fatigue and unexpected weight change.  Eyes: Negative for visual disturbance.  Respiratory: Positive for shortness of breath (mild, noted mostly prior to dialysis). Negative for cough.   Cardiovascular: Negative for chest pain, palpitations and leg swelling.  Gastrointestinal: Negative for abdominal pain and abdominal distention.  Genitourinary: Negative for dysuria, urgency and difficulty urinating.  Musculoskeletal: Negative for arthralgias and gait problem.  Skin: Negative for color change and rash.  Hematological: Negative for adenopathy.  Psychiatric/Behavioral: Negative for sleep disturbance and dysphoric mood. The patient is not nervous/anxious.        Objective:   Physical Exam  Constitutional: He is oriented to person, place, and time. He appears well-developed and well-nourished. No distress.  HENT:  Head: Normocephalic and atraumatic.  Right Ear: External ear normal.  Left Ear: External ear normal.  Nose: Nose normal.  Mouth/Throat: Oropharynx is clear and moist. No oropharyngeal exudate.  Eyes:  Conjunctivae and EOM are normal. Pupils are equal, round, and reactive to light. Right eye exhibits no discharge. Left eye exhibits no discharge. No scleral icterus.  Neck: Normal range of motion. Neck supple. No tracheal deviation present. No thyromegaly present.  Cardiovascular: Normal rate, regular rhythm and normal heart sounds.  Exam reveals no gallop and no friction rub.   No  murmur heard. Pulmonary/Chest: Effort normal. No accessory muscle usage. Not tachypneic. No respiratory distress. He has no decreased breath sounds. He has no wheezes. He has rhonchi (few scattered). He has no rales. He exhibits no tenderness.  Musculoskeletal: Normal range of motion. He exhibits no edema.  Lymphadenopathy:    He has no cervical adenopathy.  Neurological: He is alert and oriented to person, place, and time. No cranial nerve deficit. Coordination normal.  Skin: Skin is warm and dry. No rash noted. He is not diaphoretic. No erythema. No pallor.  Psychiatric: He has a normal mood and affect. His behavior is normal. Judgment and thought content normal.          Assessment & Plan:

## 2013-08-21 ENCOUNTER — Inpatient Hospital Stay: Payer: Self-pay | Admitting: Internal Medicine

## 2013-08-21 ENCOUNTER — Encounter: Payer: Self-pay | Admitting: Cardiovascular Disease

## 2013-08-21 DIAGNOSIS — I2510 Atherosclerotic heart disease of native coronary artery without angina pectoris: Secondary | ICD-10-CM

## 2013-08-21 DIAGNOSIS — N186 End stage renal disease: Secondary | ICD-10-CM

## 2013-08-21 DIAGNOSIS — I359 Nonrheumatic aortic valve disorder, unspecified: Secondary | ICD-10-CM

## 2013-08-21 LAB — CBC WITH DIFFERENTIAL/PLATELET
Basophil #: 0.1 10*3/uL (ref 0.0–0.1)
Basophil %: 1.2 %
Eosinophil #: 0.2 10*3/uL (ref 0.0–0.7)
Eosinophil %: 2.5 %
HCT: 24.1 % — ABNORMAL LOW (ref 40.0–52.0)
HGB: 8.2 g/dL — ABNORMAL LOW (ref 13.0–18.0)
Lymphocyte #: 0.4 10*3/uL — ABNORMAL LOW (ref 1.0–3.6)
Lymphocyte %: 6 %
MCH: 35.4 pg — ABNORMAL HIGH (ref 26.0–34.0)
MCHC: 34.2 g/dL (ref 32.0–36.0)
MCV: 103 fL — ABNORMAL HIGH (ref 80–100)
Monocyte #: 0.7 x10 3/mm (ref 0.2–1.0)
Monocyte %: 9.9 %
NEUTROS PCT: 80.4 %
Neutrophil #: 5.9 10*3/uL (ref 1.4–6.5)
PLATELETS: 222 10*3/uL (ref 150–440)
RBC: 2.33 10*6/uL — ABNORMAL LOW (ref 4.40–5.90)
RDW: 14.5 % (ref 11.5–14.5)
WBC: 7.3 10*3/uL (ref 3.8–10.6)

## 2013-08-21 LAB — URINALYSIS, COMPLETE
Bacteria: NONE SEEN
Bilirubin,UR: NEGATIVE
Blood: NEGATIVE
Ketone: NEGATIVE
Leukocyte Esterase: NEGATIVE
NITRITE: NEGATIVE
Ph: 8 (ref 4.5–8.0)
Protein: 30
SQUAMOUS EPITHELIAL: NONE SEEN
Specific Gravity: 1.01 (ref 1.003–1.030)
WBC UR: NONE SEEN /HPF (ref 0–5)

## 2013-08-21 LAB — COMPREHENSIVE METABOLIC PANEL
ANION GAP: 9 (ref 7–16)
Albumin: 3.5 g/dL (ref 3.4–5.0)
Alkaline Phosphatase: 79 U/L
BUN: 53 mg/dL — AB (ref 7–18)
Bilirubin,Total: 0.3 mg/dL (ref 0.2–1.0)
CO2: 25 mmol/L (ref 21–32)
Calcium, Total: 9.8 mg/dL (ref 8.5–10.1)
Chloride: 105 mmol/L (ref 98–107)
Creatinine: 5.28 mg/dL — ABNORMAL HIGH (ref 0.60–1.30)
GFR CALC AF AMER: 12 — AB
GFR CALC NON AF AMER: 10 — AB
GLUCOSE: 99 mg/dL (ref 65–99)
OSMOLALITY: 292 (ref 275–301)
POTASSIUM: 4.9 mmol/L (ref 3.5–5.1)
SGOT(AST): 8 U/L — ABNORMAL LOW (ref 15–37)
SGPT (ALT): 15 U/L (ref 12–78)
Sodium: 139 mmol/L (ref 136–145)
Total Protein: 6.9 g/dL (ref 6.4–8.2)

## 2013-08-21 LAB — CK TOTAL AND CKMB (NOT AT ARMC)
CK, TOTAL: 38 U/L (ref 35–232)
CK, TOTAL: 43 U/L (ref 35–232)
CK, Total: 59 U/L (ref 35–232)
CK-MB: 11.3 ng/mL — ABNORMAL HIGH (ref 0.5–3.6)
CK-MB: 2.9 ng/mL (ref 0.5–3.6)
CK-MB: 4.7 ng/mL — ABNORMAL HIGH (ref 0.5–3.6)

## 2013-08-21 LAB — TROPONIN I
TROPONIN-I: 0.1 ng/mL — AB
TROPONIN-I: 0.32 ng/mL — AB
TROPONIN-I: 1.2 ng/mL — AB

## 2013-08-21 LAB — CK-MB: CK-MB: 17.4 ng/mL — ABNORMAL HIGH (ref 0.5–3.6)

## 2013-08-21 LAB — PROTIME-INR
INR: 1
Prothrombin Time: 13.1 secs (ref 11.5–14.7)

## 2013-08-21 LAB — PHOSPHORUS: Phosphorus: 5.1 mg/dL — ABNORMAL HIGH (ref 2.5–4.9)

## 2013-08-22 DIAGNOSIS — I2510 Atherosclerotic heart disease of native coronary artery without angina pectoris: Secondary | ICD-10-CM

## 2013-08-22 DIAGNOSIS — I359 Nonrheumatic aortic valve disorder, unspecified: Secondary | ICD-10-CM

## 2013-08-22 LAB — CBC WITH DIFFERENTIAL/PLATELET
Basophil #: 0.1 10*3/uL (ref 0.0–0.1)
Basophil %: 1.1 %
EOS PCT: 2.3 %
Eosinophil #: 0.1 10*3/uL (ref 0.0–0.7)
HCT: 21.8 % — ABNORMAL LOW (ref 40.0–52.0)
HGB: 7.4 g/dL — AB (ref 13.0–18.0)
LYMPHS ABS: 0.3 10*3/uL — AB (ref 1.0–3.6)
LYMPHS PCT: 4.9 %
MCH: 35 pg — ABNORMAL HIGH (ref 26.0–34.0)
MCHC: 34.1 g/dL (ref 32.0–36.0)
MCV: 103 fL — ABNORMAL HIGH (ref 80–100)
MONOS PCT: 12.7 %
Monocyte #: 0.8 x10 3/mm (ref 0.2–1.0)
NEUTROS ABS: 5.2 10*3/uL (ref 1.4–6.5)
Neutrophil %: 79 %
PLATELETS: 202 10*3/uL (ref 150–440)
RBC: 2.13 10*6/uL — AB (ref 4.40–5.90)
RDW: 15.1 % — AB (ref 11.5–14.5)
WBC: 6.5 10*3/uL (ref 3.8–10.6)

## 2013-08-22 LAB — LIPID PANEL
Cholesterol: 93 mg/dL (ref 0–200)
HDL Cholesterol: 51 mg/dL (ref 40–60)
Ldl Cholesterol, Calc: 29 mg/dL (ref 0–100)
Triglycerides: 66 mg/dL (ref 0–200)
VLDL Cholesterol, Calc: 13 mg/dL (ref 5–40)

## 2013-08-23 LAB — CBC WITH DIFFERENTIAL/PLATELET
Basophil #: 0.1 10*3/uL (ref 0.0–0.1)
Basophil %: 1.3 %
EOS ABS: 0.3 10*3/uL (ref 0.0–0.7)
Eosinophil %: 4.8 %
HCT: 22.2 % — ABNORMAL LOW (ref 40.0–52.0)
HGB: 7.6 g/dL — ABNORMAL LOW (ref 13.0–18.0)
LYMPHS ABS: 0.6 10*3/uL — AB (ref 1.0–3.6)
LYMPHS PCT: 9.1 %
MCH: 35.1 pg — ABNORMAL HIGH (ref 26.0–34.0)
MCHC: 34.1 g/dL (ref 32.0–36.0)
MCV: 103 fL — AB (ref 80–100)
MONO ABS: 0.9 x10 3/mm (ref 0.2–1.0)
Monocyte %: 14.1 %
NEUTROS PCT: 70.7 %
Neutrophil #: 4.3 10*3/uL (ref 1.4–6.5)
Platelet: 209 10*3/uL (ref 150–440)
RBC: 2.16 10*6/uL — AB (ref 4.40–5.90)
RDW: 15.1 % — AB (ref 11.5–14.5)
WBC: 6.1 10*3/uL (ref 3.8–10.6)

## 2013-08-24 ENCOUNTER — Encounter (HOSPITAL_COMMUNITY): Payer: Self-pay | Admitting: General Practice

## 2013-08-24 ENCOUNTER — Other Ambulatory Visit: Payer: Self-pay | Admitting: Physician Assistant

## 2013-08-24 ENCOUNTER — Inpatient Hospital Stay (HOSPITAL_COMMUNITY)
Admission: AD | Admit: 2013-08-24 | Discharge: 2013-08-29 | DRG: 286 | Disposition: A | Discharge status: Home / Self Care | Payer: Medicare Other | Source: Other Acute Inpatient Hospital | Attending: Internal Medicine | Admitting: Internal Medicine

## 2013-08-24 DIAGNOSIS — I120 Hypertensive chronic kidney disease with stage 5 chronic kidney disease or end stage renal disease: Secondary | ICD-10-CM | POA: Diagnosis present

## 2013-08-24 DIAGNOSIS — I2510 Atherosclerotic heart disease of native coronary artery without angina pectoris: Secondary | ICD-10-CM

## 2013-08-24 DIAGNOSIS — D500 Iron deficiency anemia secondary to blood loss (chronic): Secondary | ICD-10-CM | POA: Diagnosis present

## 2013-08-24 DIAGNOSIS — K219 Gastro-esophageal reflux disease without esophagitis: Secondary | ICD-10-CM

## 2013-08-24 DIAGNOSIS — Z7982 Long term (current) use of aspirin: Secondary | ICD-10-CM

## 2013-08-24 DIAGNOSIS — I214 Non-ST elevation (NSTEMI) myocardial infarction: Secondary | ICD-10-CM | POA: Diagnosis present

## 2013-08-24 DIAGNOSIS — R634 Abnormal weight loss: Secondary | ICD-10-CM | POA: Diagnosis present

## 2013-08-24 DIAGNOSIS — C3490 Malignant neoplasm of unspecified part of unspecified bronchus or lung: Secondary | ICD-10-CM | POA: Diagnosis present

## 2013-08-24 DIAGNOSIS — N186 End stage renal disease: Secondary | ICD-10-CM

## 2013-08-24 DIAGNOSIS — I10 Essential (primary) hypertension: Secondary | ICD-10-CM

## 2013-08-24 DIAGNOSIS — Z888 Allergy status to other drugs, medicaments and biological substances status: Secondary | ICD-10-CM

## 2013-08-24 DIAGNOSIS — E46 Unspecified protein-calorie malnutrition: Secondary | ICD-10-CM | POA: Diagnosis present

## 2013-08-24 DIAGNOSIS — Z933 Colostomy status: Secondary | ICD-10-CM

## 2013-08-24 DIAGNOSIS — K921 Melena: Secondary | ICD-10-CM

## 2013-08-24 DIAGNOSIS — N039 Chronic nephritic syndrome with unspecified morphologic changes: Secondary | ICD-10-CM

## 2013-08-24 DIAGNOSIS — Z87891 Personal history of nicotine dependence: Secondary | ICD-10-CM

## 2013-08-24 DIAGNOSIS — Z992 Dependence on renal dialysis: Secondary | ICD-10-CM

## 2013-08-24 DIAGNOSIS — J449 Chronic obstructive pulmonary disease, unspecified: Secondary | ICD-10-CM

## 2013-08-24 DIAGNOSIS — E8809 Other disorders of plasma-protein metabolism, not elsewhere classified: Secondary | ICD-10-CM | POA: Diagnosis present

## 2013-08-24 DIAGNOSIS — N189 Chronic kidney disease, unspecified: Secondary | ICD-10-CM

## 2013-08-24 DIAGNOSIS — I350 Nonrheumatic aortic (valve) stenosis: Secondary | ICD-10-CM

## 2013-08-24 DIAGNOSIS — I509 Heart failure, unspecified: Secondary | ICD-10-CM | POA: Diagnosis present

## 2013-08-24 DIAGNOSIS — K922 Gastrointestinal hemorrhage, unspecified: Secondary | ICD-10-CM

## 2013-08-24 DIAGNOSIS — I359 Nonrheumatic aortic valve disorder, unspecified: Principal | ICD-10-CM | POA: Diagnosis present

## 2013-08-24 DIAGNOSIS — N185 Chronic kidney disease, stage 5: Secondary | ICD-10-CM

## 2013-08-24 DIAGNOSIS — G4700 Insomnia, unspecified: Secondary | ICD-10-CM

## 2013-08-24 DIAGNOSIS — D631 Anemia in chronic kidney disease: Secondary | ICD-10-CM

## 2013-08-24 DIAGNOSIS — I5032 Chronic diastolic (congestive) heart failure: Secondary | ICD-10-CM

## 2013-08-24 HISTORY — DX: Anemia, unspecified: D64.9

## 2013-08-24 HISTORY — DX: Allergy status to other drugs, medicaments and biological substances: Z88.8

## 2013-08-24 HISTORY — DX: Shortness of breath: R06.02

## 2013-08-24 HISTORY — DX: Personal history of other medical treatment: Z92.89

## 2013-08-24 HISTORY — DX: Disorder of kidney and ureter, unspecified: N28.9

## 2013-08-24 HISTORY — DX: Anemia in chronic kidney disease: D63.1

## 2013-08-24 HISTORY — DX: Chronic kidney disease, unspecified: N18.9

## 2013-08-24 HISTORY — DX: Personal history of antineoplastic chemotherapy: Z92.21

## 2013-08-24 HISTORY — DX: Gastro-esophageal reflux disease without esophagitis: K21.9

## 2013-08-24 HISTORY — DX: Unspecified osteoarthritis, unspecified site: M19.90

## 2013-08-24 HISTORY — DX: Dependence on renal dialysis: Z99.2

## 2013-08-24 HISTORY — DX: Cardiac murmur, unspecified: R01.1

## 2013-08-24 HISTORY — DX: Chronic diastolic (congestive) heart failure: I50.32

## 2013-08-24 HISTORY — DX: Gastrointestinal hemorrhage, unspecified: K92.2

## 2013-08-24 HISTORY — DX: Abdominal aortic aneurysm, without rupture: I71.4

## 2013-08-24 HISTORY — DX: Unspecified protein-calorie malnutrition: E46

## 2013-08-24 HISTORY — DX: Other disorders of plasma-protein metabolism, not elsewhere classified: E88.09

## 2013-08-24 HISTORY — DX: Dependence on renal dialysis: N18.6

## 2013-08-24 HISTORY — DX: Personal history of irradiation: Z92.3

## 2013-08-24 HISTORY — DX: Non-ST elevation (NSTEMI) myocardial infarction: I21.4

## 2013-08-24 LAB — CBC WITH DIFFERENTIAL/PLATELET
Basophil #: 0.1 10*3/uL (ref 0.0–0.1)
Basophil %: 0.8 %
Eosinophil #: 0.3 10*3/uL (ref 0.0–0.7)
Eosinophil %: 3.4 %
HCT: 24 % — ABNORMAL LOW (ref 40.0–52.0)
HGB: 8.1 g/dL — ABNORMAL LOW (ref 13.0–18.0)
Lymphocyte #: 0.6 10*3/uL — ABNORMAL LOW (ref 1.0–3.6)
Lymphocyte %: 6.8 %
MCH: 34.9 pg — AB (ref 26.0–34.0)
MCHC: 33.9 g/dL (ref 32.0–36.0)
MCV: 103 fL — ABNORMAL HIGH (ref 80–100)
MONO ABS: 1.1 x10 3/mm — AB (ref 0.2–1.0)
Monocyte %: 13.4 %
NEUTROS ABS: 6.3 10*3/uL (ref 1.4–6.5)
Neutrophil %: 75.6 %
Platelet: 233 10*3/uL (ref 150–440)
RBC: 2.33 10*6/uL — ABNORMAL LOW (ref 4.40–5.90)
RDW: 15.2 % — ABNORMAL HIGH (ref 11.5–14.5)
WBC: 8.3 10*3/uL (ref 3.8–10.6)

## 2013-08-24 LAB — PHOSPHORUS: Phosphorus: 3.3 mg/dL (ref 2.5–4.9)

## 2013-08-24 LAB — OCCULT BLOOD X 1 CARD TO LAB, STOOL: Occult Blood, Feces: POSITIVE

## 2013-08-24 MED ORDER — ASPIRIN EC 81 MG PO TBEC
81.0000 mg | DELAYED_RELEASE_TABLET | Freq: Every day | ORAL | Status: DC
  Administered 2013-08-25 – 2013-08-29 (×5): 81 mg via ORAL
  Filled 2013-08-24 (×5): qty 1

## 2013-08-24 MED ORDER — SODIUM CHLORIDE 0.9 % IJ SOLN
3.0000 mL | Freq: Two times a day (BID) | INTRAMUSCULAR | Status: DC
  Administered 2013-08-24 – 2013-08-28 (×7): 3 mL via INTRAVENOUS

## 2013-08-24 MED ORDER — ALPRAZOLAM 0.25 MG PO TABS
0.2500 mg | ORAL_TABLET | Freq: Two times a day (BID) | ORAL | Status: DC | PRN
  Administered 2013-08-29: 09:00:00 0.25 mg via ORAL
  Filled 2013-08-24: qty 1

## 2013-08-24 MED ORDER — SEVELAMER CARBONATE 800 MG PO TABS
2400.0000 mg | ORAL_TABLET | Freq: Three times a day (TID) | ORAL | Status: DC
Start: 2013-08-24 — End: 2013-08-29
  Administered 2013-08-24 – 2013-08-29 (×7): 2400 mg via ORAL
  Filled 2013-08-24 (×17): qty 3

## 2013-08-24 MED ORDER — IPRATROPIUM-ALBUTEROL 20-100 MCG/ACT IN AERS: 2.0000 | INHALATION_SPRAY | Freq: Four times a day (QID) | RESPIRATORY_TRACT | Status: DC

## 2013-08-24 MED ORDER — LORATADINE 10 MG PO TABS
10.0000 mg | ORAL_TABLET | Freq: Every day | ORAL | Status: DC
Start: 2013-08-24 — End: 2013-08-29
  Administered 2013-08-25 – 2013-08-29 (×5): 10 mg via ORAL
  Filled 2013-08-24 (×6): qty 1

## 2013-08-24 MED ORDER — ZOLPIDEM TARTRATE 5 MG PO TABS
5.0000 mg | ORAL_TABLET | Freq: Every evening | ORAL | Status: DC | PRN
  Administered 2013-08-25 – 2013-08-28 (×5): 5 mg via ORAL
  Filled 2013-08-24 (×5): qty 1

## 2013-08-24 MED ORDER — SUCRALFATE 1 GM/10ML PO SUSP
1.0000 g | Freq: Four times a day (QID) | ORAL | Status: DC
  Administered 2013-08-24 – 2013-08-29 (×15): 1 g via ORAL
  Filled 2013-08-24 (×25): qty 10

## 2013-08-24 MED ORDER — METOPROLOL TARTRATE 25 MG PO TABS
25.0000 mg | ORAL_TABLET | Freq: Two times a day (BID) | ORAL | Status: DC
  Administered 2013-08-24 – 2013-08-28 (×8): 25 mg via ORAL
  Filled 2013-08-24 (×13): qty 1

## 2013-08-24 MED ORDER — SODIUM CHLORIDE 0.9 % IV SOLN: 250.0000 mL | INTRAVENOUS | Status: DC | PRN

## 2013-08-24 MED ORDER — SEVELAMER CARBONATE 800 MG PO TABS
2400.0000 mg | ORAL_TABLET | Freq: Three times a day (TID) | ORAL | Status: DC
  Filled 2013-08-24: qty 3

## 2013-08-24 MED ORDER — PANTOPRAZOLE SODIUM 40 MG IV SOLR
40.0000 mg | Freq: Two times a day (BID) | INTRAVENOUS | Status: DC
  Administered 2013-08-24 – 2013-08-29 (×10): 40 mg via INTRAVENOUS
  Filled 2013-08-24 (×13): qty 40

## 2013-08-24 MED ORDER — IPRATROPIUM-ALBUTEROL 0.5-2.5 (3) MG/3ML IN SOLN
3.0000 mL | Freq: Four times a day (QID) | RESPIRATORY_TRACT | Status: DC
  Administered 2013-08-24 – 2013-08-26 (×5): 3 mL via RESPIRATORY_TRACT
  Filled 2013-08-24 (×6): qty 3

## 2013-08-24 MED ORDER — NITROGLYCERIN 0.4 MG SL SUBL: 0.4000 mg | SUBLINGUAL_TABLET | SUBLINGUAL | Status: DC | PRN

## 2013-08-24 MED ORDER — ONDANSETRON HCL 4 MG/2ML IJ SOLN: 4.0000 mg | Freq: Four times a day (QID) | INTRAMUSCULAR | Status: DC | PRN

## 2013-08-24 MED ORDER — AMLODIPINE BESYLATE 10 MG PO TABS
10.0000 mg | ORAL_TABLET | Freq: Every day | ORAL | Status: DC
  Administered 2013-08-25 – 2013-08-26 (×2): 10 mg via ORAL
  Filled 2013-08-24 (×3): qty 1

## 2013-08-24 MED ORDER — SODIUM CHLORIDE 0.9 % IJ SOLN
3.0000 mL | INTRAMUSCULAR | Status: DC | PRN
  Administered 2013-08-26: 3 mL via INTRAVENOUS

## 2013-08-24 MED ORDER — ATORVASTATIN CALCIUM 20 MG PO TABS
20.0000 mg | ORAL_TABLET | Freq: Every day | ORAL | Status: DC
  Administered 2013-08-25 – 2013-08-28 (×4): 20 mg via ORAL
  Filled 2013-08-24 (×7): qty 1

## 2013-08-24 MED ORDER — ACETAMINOPHEN 325 MG PO TABS: 650.0000 mg | ORAL_TABLET | ORAL | Status: DC | PRN

## 2013-08-24 NOTE — Plan of Care (Signed)
Problem: Phase II Progression Outcomes Goal: Other Phase II Outcomes/Goals Outcome: Progressing Awaiting TCTS consult

## 2013-08-24 NOTE — Progress Notes (Addendum)
TCTS BRIEF PROGRESS NOTE   Awaiting hospital records, cath and ECHO films from [facility], plus reports from most recent PET and CT scans with all pertinent records regarding lung cancer and previous hospitalizations at [facility].  Full consult to follow.  OWEN,Gerald Cooke

## 2013-08-25 ENCOUNTER — Encounter (HOSPITAL_COMMUNITY): Payer: Self-pay | Admitting: Thoracic Surgery (Cardiothoracic Vascular Surgery)

## 2013-08-25 ENCOUNTER — Inpatient Hospital Stay (HOSPITAL_COMMUNITY): Payer: Medicare Other

## 2013-08-25 ENCOUNTER — Telehealth: Payer: Self-pay | Admitting: Internal Medicine

## 2013-08-25 DIAGNOSIS — N186 End stage renal disease: Secondary | ICD-10-CM

## 2013-08-25 DIAGNOSIS — Z923 Personal history of irradiation: Secondary | ICD-10-CM | POA: Insufficient documentation

## 2013-08-25 DIAGNOSIS — N185 Chronic kidney disease, stage 5: Secondary | ICD-10-CM

## 2013-08-25 DIAGNOSIS — E46 Unspecified protein-calorie malnutrition: Secondary | ICD-10-CM | POA: Diagnosis present

## 2013-08-25 DIAGNOSIS — I359 Nonrheumatic aortic valve disorder, unspecified: Principal | ICD-10-CM

## 2013-08-25 DIAGNOSIS — I214 Non-ST elevation (NSTEMI) myocardial infarction: Secondary | ICD-10-CM | POA: Diagnosis present

## 2013-08-25 DIAGNOSIS — D631 Anemia in chronic kidney disease: Secondary | ICD-10-CM | POA: Diagnosis present

## 2013-08-25 DIAGNOSIS — Z888 Allergy status to other drugs, medicaments and biological substances status: Secondary | ICD-10-CM

## 2013-08-25 DIAGNOSIS — I10 Essential (primary) hypertension: Secondary | ICD-10-CM

## 2013-08-25 DIAGNOSIS — K219 Gastro-esophageal reflux disease without esophagitis: Secondary | ICD-10-CM | POA: Diagnosis present

## 2013-08-25 DIAGNOSIS — K922 Gastrointestinal hemorrhage, unspecified: Secondary | ICD-10-CM | POA: Diagnosis present

## 2013-08-25 DIAGNOSIS — I5032 Chronic diastolic (congestive) heart failure: Secondary | ICD-10-CM | POA: Diagnosis present

## 2013-08-25 DIAGNOSIS — Z9221 Personal history of antineoplastic chemotherapy: Secondary | ICD-10-CM | POA: Insufficient documentation

## 2013-08-25 DIAGNOSIS — N189 Chronic kidney disease, unspecified: Secondary | ICD-10-CM

## 2013-08-25 DIAGNOSIS — I2510 Atherosclerotic heart disease of native coronary artery without angina pectoris: Secondary | ICD-10-CM

## 2013-08-25 DIAGNOSIS — E8809 Other disorders of plasma-protein metabolism, not elsewhere classified: Secondary | ICD-10-CM | POA: Diagnosis present

## 2013-08-25 LAB — LIPID PANEL
CHOLESTEROL: 98 mg/dL (ref 0–200)
HDL: 54 mg/dL (ref >39)
LDL Cholesterol: 30 mg/dL (ref 0–99)
TRIGLYCERIDES: 71 mg/dL (ref <150)
Total CHOL/HDL Ratio: 1.8 RATIO
VLDL: 14 mg/dL (ref 0–40)

## 2013-08-25 LAB — CBC
HCT: 27.7 % — ABNORMAL LOW (ref 39.0–52.0)
Hemoglobin: 9.4 g/dL — ABNORMAL LOW (ref 13.0–17.0)
MCH: 34.6 pg — ABNORMAL HIGH (ref 26.0–34.0)
MCHC: 33.9 g/dL (ref 30.0–36.0)
MCV: 101.8 fL — AB (ref 78.0–100.0)
PLATELETS: 229 10*3/uL (ref 150–400)
RBC: 2.72 MIL/uL — AB (ref 4.22–5.81)
RDW: 16.4 % — ABNORMAL HIGH (ref 11.5–15.5)
WBC: 7.5 10*3/uL (ref 4.0–10.5)

## 2013-08-25 LAB — COMPREHENSIVE METABOLIC PANEL
ALK PHOS: 74 U/L (ref 39–117)
ALT: 9 U/L (ref 0–53)
AST: 12 U/L (ref 0–37)
Albumin: 2.9 g/dL — ABNORMAL LOW (ref 3.5–5.2)
BILIRUBIN TOTAL: 0.4 mg/dL (ref 0.3–1.2)
BUN: 37 mg/dL — AB (ref 6–23)
CHLORIDE: 100 meq/L (ref 96–112)
CO2: 29 mEq/L (ref 19–32)
Calcium: 9.2 mg/dL (ref 8.4–10.5)
Creatinine, Ser: 4.82 mg/dL — ABNORMAL HIGH (ref 0.50–1.35)
GFR calc Af Amer: 13 mL/min — ABNORMAL LOW (ref >90)
GFR, EST NON AFRICAN AMERICAN: 11 mL/min — AB (ref >90)
Glucose, Bld: 72 mg/dL (ref 70–99)
POTASSIUM: 4.6 meq/L (ref 3.7–5.3)
Sodium: 141 mEq/L (ref 137–147)
Total Protein: 6.1 g/dL (ref 6.0–8.3)

## 2013-08-25 LAB — BLOOD GAS, ARTERIAL
ACID-BASE EXCESS: 3.8 mmol/L — AB (ref 0.0–2.0)
Bicarbonate: 27.6 mEq/L — ABNORMAL HIGH (ref 20.0–24.0)
DRAWN BY: 36274
O2 Saturation: 96.9 %
Patient temperature: 98.6
TCO2: 28.8 mmol/L (ref 0–100)
pCO2 arterial: 39.9 mmHg (ref 35.0–45.0)
pH, Arterial: 7.454 — ABNORMAL HIGH (ref 7.350–7.450)
pO2, Arterial: 81.9 mmHg (ref 80.0–100.0)

## 2013-08-25 LAB — PROTIME-INR
INR: 0.95 (ref 0.00–1.49)
Prothrombin Time: 12.5 seconds (ref 11.6–15.2)

## 2013-08-25 LAB — PHOSPHORUS: Phosphorus: 3.5 mg/dL (ref 2.3–4.6)

## 2013-08-25 LAB — TSH: TSH: 2.501 u[IU]/mL (ref 0.350–4.500)

## 2013-08-25 LAB — PRO B NATRIURETIC PEPTIDE: Pro B Natriuretic peptide (BNP): 42409 pg/mL — ABNORMAL HIGH (ref 0–125)

## 2013-08-25 LAB — PREALBUMIN: Prealbumin: 21.1 mg/dL (ref 17.0–34.0)

## 2013-08-25 LAB — APTT: APTT: 32 s (ref 24–37)

## 2013-08-25 MED ORDER — IOHEXOL 350 MG/ML SOLN
100.0000 mL | Freq: Once | INTRAVENOUS | Status: AC | PRN
  Administered 2013-08-25: 100 mL via INTRAVENOUS

## 2013-08-25 NOTE — Consult Note (Signed)
Gerald Cooke 08/25/2013 Gerald Cooke, B Requesting Physician:  Fransico Him, MD  Reason for Consult:  Ongoing ESRD Care HPI:  59M ESRD MWF [facility] via [facility] AVF transferred from [place] for CAD and severe AS for consideration of CABG/AVR.  Pt also with melena in colostomy needing GI evaluation.  Has had no recent issues with HD.  Stable AVF, volume status.  Follows with [facility].      Filed Weights   08/24/13 1707 08/25/13 0527  Weight: 56.654 kg (124 lb 14.4 oz) 56.972 kg (125 lb 9.6 oz)    I/O last 3 completed shifts: In: 360 [P.O.:360] Out: -   ROS Balance of 12 systems is negative w/ exceptions as above  Outpt HD Orders Unit: [facility] Days: MWF Time: 3h Dialyzer: F180 EDW: 60.5kg K/Ca: 2K/2Ca Access: AVG RUA Needle Size: 15g BFR: 450 UF Proflie: none VDRA: Hectorol 4qTx EPO: 8200 qTx Heparin: NO HEPARIN  PMH  Past Medical History  Diagnosis Date  . Colostomy in place   . Abdominal aortic aneurysm 02/2007    Ruptured, hospitalized at [facility] for 52 days, c/b loss of colon and renal function. Followed by Dr Ronalee Belts  . Hyperlipidemia   . Atherosclerosis   . Esophageal reflux   . Hypertension   . PSVT (paroxysmal supraventricular tachycardia)   . Aortic stenosis   . Lung cancer 2008    Initial clinical stage IIIB - treated with radiation + chemotherapy  . Heart murmur   . ESRD (end stage renal disease) on dialysis     "[facility]; [place]; [place], Wed & Fri,  Dr Holley Raring" (08/24/2013)  . Exertional shortness of breath   . Anemia   . History of blood transfusion 2008    "related to ruptured AAA" (08/24/2013)  . Arthritis     "fingers"   . GERD (gastroesophageal reflux disease)   . Chronic diastolic congestive heart failure   . S/P radiation therapy > 12 wks ago   . S/P chemotherapy, time since greater than 12 weeks   . 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 Hypoalbuminemia   . Anemia in chronic kidney disease   . GI (gastrointestinal  bleed)   . Heparin allergy   . Stage 4 lung cancer 05/2013    likely lymphangitic spread of tumor on CTA chest  . Non-STEMI (non-ST elevated myocardial infarction)    PSH  Past Surgical History  Procedure Laterality Date  . Colostomy  2008  . Bowel resection  2008    partial colectomy for ischemic colitis after emergency repair of ruptured AAA  . Abdominal aortic aneurysm repair  02/2007    [facility]:  emergency repair for ruptured AAA  . Arteriovenous graft placement Left 2008    "arm"  . Pilonidal cyst / sinus excision    . Cataract extraction w/ intraocular lens implant Right   . Nephrectomy      following repair of ruptured AAA   FH History reviewed. No pertinent family history. SH  reports that he has quit smoking. His smoking use included Cigarettes. He has a 50 pack-year smoking history. He has never used smokeless tobacco. He reports that he drinks alcohol. He reports that he does not use illicit drugs. Allergies  Allergies  Allergen Reactions  . Heparin   . Tetanus Toxoids    Home medications Prior to Admission medications   Medication Sig Start Date End Date Taking? Authorizing Provider  aspirin 81 MG tablet Take 81 mg by mouth  daily.   Yes Historical Provider, MD  atorvastatin (LIPITOR) 40 MG tablet Take 40 mg by mouth every evening.  06/04/13  Yes Historical Provider, MD  cetirizine (ZYRTEC) 10 MG tablet Take 10 mg by mouth daily as needed.    Yes Historical Provider, MD  diltiazem (TIAZAC) 120 MG 24 hr capsule Take 120 mg by mouth daily.   Yes Historical Provider, MD  guaiFENesin-codeine (ROBITUSSIN AC) 100-10 MG/5ML syrup Take 5 mLs by mouth 2 (two) times daily as needed for cough. 07/28/13  Yes Jackolyn Confer, MD  Ipratropium-Albuterol (COMBIVENT RESPIMAT) 20-100 MCG/ACT AERS respimat Inhale 1 puff into the lungs as needed for wheezing. Every 6 hours as needed   Yes Historical Provider, MD  loperamide (IMODIUM) 2 MG capsule Take 2 mg by mouth as needed for diarrhea  or loose stools.   Yes Historical Provider, MD  metoprolol tartrate (LOPRESSOR) 25 MG tablet Take 25 mg by mouth daily. 09/23/12  Yes Wellington Hampshire, MD  ondansetron (ZOFRAN) 8 MG tablet Take 1 tablet (8 mg total) by mouth every 6 (six) hours as needed. 11/10/12  Yes Jackolyn Confer, MD  ranitidine (ZANTAC) 150 MG tablet Take 150 mg by mouth 2 (two) times daily.     Yes Historical Provider, MD  sevelamer (RENVELA) 800 MG tablet Take 800 mg by mouth 3 (three) times daily with meals.     Yes Historical Provider, MD    Current Medications Current Facility-Administered Medications  Medication Dose Route Frequency Provider Last Rate Last Dose  . 0.9 %  sodium chloride infusion  250 mL Intravenous PRN Roger A Arguello, PA-C      . acetaminophen (TYLENOL) tablet 650 mg  650 mg Oral Q4H PRN Roger A Arguello, PA-C      . ALPRAZolam Duanne Moron) tablet 0.25 mg  0.25 mg Oral BID PRN Roger A Arguello, PA-C      . amLODipine (NORVASC) tablet 10 mg  10 mg Oral Daily Roger A Arguello, PA-C   10 mg at 08/25/13 1014  . aspirin EC tablet 81 mg  81 mg Oral Daily Roger A Arguello, PA-C   81 mg at 08/25/13 1014  . atorvastatin (LIPITOR) tablet 20 mg  20 mg Oral q1800 Roger A Arguello, PA-C      . ipratropium-albuterol (DUONEB) 0.5-2.5 (3) MG/3ML nebulizer solution 3 mL  3 mL Nebulization Q6H Pixie Casino, MD   3 mL at 08/24/13 2032  . loratadine (CLARITIN) tablet 10 mg  10 mg Oral Daily Rhonda G Barrett, PA-C   10 mg at 08/25/13 1013  . metoprolol tartrate (LOPRESSOR) tablet 25 mg  25 mg Oral BID Roger A Arguello, PA-C   25 mg at 08/25/13 1012  . nitroGLYCERIN (NITROSTAT) SL tablet 0.4 mg  0.4 mg Sublingual Q5 Min x 3 PRN Roger A Arguello, PA-C      . ondansetron (ZOFRAN) injection 4 mg  4 mg Intravenous Q6H PRN Roger A Arguello, PA-C      . pantoprazole (PROTONIX) injection 40 mg  40 mg Intravenous Q12H Roger A Arguello, PA-C   40 mg at 08/25/13 1012  . sevelamer carbonate (RENVELA) tablet 2,400 mg  2,400 mg Oral  TID WC Pixie Casino, MD   2,400 mg at 08/24/13 1807  . sodium chloride 0.9 % injection 3 mL  3 mL Intravenous Q12H Roger A Arguello, PA-C   3 mL at 08/25/13 1000  . sodium chloride 0.9 % injection 3 mL  3 mL Intravenous PRN Francee Piccolo  A Arguello, PA-C      . sucralfate (CARAFATE) 1 GM/10ML suspension 1 g  1 g Oral Q6H Roger A Arguello, PA-C   1 g at 08/24/13 2241  . zolpidem (AMBIEN) tablet 5 mg  5 mg Oral QHS PRN Meriel Pica, PA-C   5 mg at 08/25/13 0038    CBC  Recent Labs Lab 08/25/13 0305  WBC 7.5  HGB 9.4*  HCT 27.7*  MCV 101.8*  PLT 977   Basic Metabolic Panel  Recent Labs Lab 08/25/13 0305  NA 141  K 4.6  CL 100  CO2 29  GLUCOSE 72  BUN 37*  CREATININE 4.82*  CALCIUM 9.2    Physical Exam  Blood pressure 131/53, pulse 76, temperature 98.3 F (36.8 C), temperature source Oral, resp. rate 18, height 5\' 7"  (1.702 m), weight 56.972 kg (125 lb 9.6 oz), SpO2 98.00%. GEN: NAD ENT: NCAT. Fair dentition EYES: EOMI CV: 3/6 HSM best at RUSB but also at apex.  RRR PULM: CTAB. No crackles ABD: s/nt.  R sided colostomy pink, stool in bag, normal appearance SKIN: no rashes/lesions EXT:No LEE b/l VASC: R RC AVF +B/T   A/P 1. ESRD: cont maintenacne HD MWF, will obtain records.  MWF.  No heparin.  Appears euvolemic.   2. HTN/Vol: Keep target EDW for now.  BP ok 3. Anemia: Cont outpt regimen.  Transfusions per Cardiology 4. MBD: Cont outpt regimen.  Add on Phos.   5. CAD and severe AS: per cardiology and CT surgery 6. ?GIB: GI Consult ordered.  No heparin with Tx.    Gerald Grippe MD 08/25/2013, 1:16 PM

## 2013-08-25 NOTE — Progress Notes (Signed)
UR Completed Emerly Prak Graves-Bigelow, RN,BSN [phone]  

## 2013-08-25 NOTE — Telephone Encounter (Signed)
Relevant patient education assigned to patient using Emmi. ° °

## 2013-08-25 NOTE — Consult Note (Addendum)
[address]       [place], [address]             [phone]          CARDIOTHORACIC SURGERY CONSULTATION REPORT  PCP is Rica Mast, MD Referring Provider is ARIDA, Mertie Clause, MD   Reason for consultation:  Severe aortic stenosis and left main disease  HPI:  Patient is a 73 year old retired white male from [place] complicated past medical history including recently discovered severe aortic stenosis, coronary artery disease, hypertension, end-stage renal disease on dialysis, stage IIIB lung cancer, anemia, and recent GI bleeding.  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 He was hospitalized at [facility] and had a prolonged convalescence. He developed acute renal failure and has remained dialysis dependent ever sinse. 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 He gradually recovered.  During his time in the hospital he was noted to have a lung mass in the right upper lobe. Ultimately he was diagnosed with non-small cell lung cancer, clinical stage IIIB. He was treated with full dose radiation therapy and chemotherapy in 2009. He has been followed by Dr. Oliva Bustard and has been felt to remain in clinical remission. In November 2014 followup chest CT scan was interpreted as demonstrating stable radiographic appearance of chronic cavitary mass in the right upper lobe consistent with the patient's previous primary lung cancer. However, there was also question possible lymphangitic spread of tumor. Subsequent PET/CT scan revealed increased uptake within the right upper lobe mass consistent with persistent or recurrent lung cancer. However, there were no other areas of increased uptake at that time to suggest distant metastatic disease. The patient was  followed clinically.  The patient states that for the past several months he has developed gradual progression of symptoms of exertional shortness of breath. Within the last few weeks he has had episodes resting shortness of breath, and he was hospitalized briefly at [facility] 2 weeks ago with acute exacerbation of congestive heart failure and possible pneumonia. Symptoms resolved and the patient was discharged home. Details are not currently available.  On 08/21/2013 the patient presented again to [facility] with burning epigastric chest pain that was prolonged. Patient states he has had several intermittent episodes of less severe prior to admission, but on the day of admission the symptoms persisted. He was given a GI cocktail in the emergency department and his pain resolved. He denied any associated symptoms of shortness of breath. Similar burning epigastric chest pain has not recurred over the last few days.  He was noted to have melanotic stool and iron deficient anemia.  His hemoglobin gradually decreased to 7.4 at which point he was transfused 1 unit of packed red blood cells. During this period of time the patient was noted to have mildly positive cardiac enzymes and non-specific ECG changes suggestive of possible subendocardial myocardial infarction.  Transthoracic echocardiogram was performed demonstrating severe aortic stenosis with peak velocity across the valve measured 4.1 m/s corresponding to peak and mean transvalvular gradients of 69 and 45 mm mercury respectively. There was moderate global left ventricular systolic dysfunction with ejection fraction estimated 40-45%. The patient underwent diagnostic cardiac catheterization demonstrating diffuse nonobstructive coronary artery disease with exception of possible significant ostial stenosis of the left main coronary artery. Pulmonary artery pressures were  normal. The patient was transferred to [facility] for further management.  Cardiothoracic surgical consultation was requested.  The patient lives with his wife in [place]. He dialyzes on a Monday Wednesday Friday schedule and has done generally well with long-term dialysis therapy. He does note that recently there have been problems with low blood pressure during dialysis. He remains relatively active and functionally independent. He describes a long history of exertional shortness of breath which has progressed in recent months. He denies any history of PND, orthopnea, or lower extremity edema. He has not had significant dizzy spells nor syncope. He has never had any exertional chest pain or chest tightness.  Past Medical History  Diagnosis Date  . Colostomy in place   . Abdominal aortic aneurysm 02/2007    Ruptured, hospitalized at [facility] for 52 days, c/b loss of colon and renal function. Followed by Dr Ronalee Belts  . Hyperlipidemia   . Atherosclerosis   . Esophageal reflux   . Hypertension   . PSVT (paroxysmal supraventricular tachycardia)   . Aortic stenosis   . Lung cancer 2009    Initial clinical stage IIIB - treated with radiation + chemotherapy  . Heart murmur   . ESRD (end stage renal disease) on dialysis     "[facility]; [place]; [place], Wed & Fri,  Dr Holley Raring" (08/24/2013)  . Exertional shortness of breath   . Anemia   . History of blood transfusion 2008    "related to ruptured AAA" (08/24/2013)  . Arthritis     "fingers"   . GERD (gastroesophageal reflux disease)   . Chronic diastolic congestive heart failure   . S/P radiation therapy > 12 wks ago   . S/P chemotherapy, time since greater than 12 weeks   . 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 Hypoalbuminemia   . Anemia in chronic kidney disease   . GI (gastrointestinal bleed)   . Heparin allergy   . Stage 4 lung cancer 05/2013    likely lymphangitic spread of tumor on CTA chest  . Non-STEMI (non-ST elevated myocardial infarction)   . Malnutrition     Past Surgical History  Procedure Laterality  Date  . Colostomy  2008  . Bowel resection  2008    partial colectomy for ischemic colitis after emergency repair of ruptured AAA  . Abdominal aortic aneurysm repair  02/2007    [facility]:  emergency repair for ruptured AAA  . Arteriovenous graft placement Left 2008    "arm"  . Pilonidal cyst / sinus excision    . Cataract extraction w/ intraocular lens implant Right   . Nephrectomy      following repair of ruptured AAA    History reviewed. No pertinent family history.  History   Social History  . Marital Status: Married    Spouse Name: N/A    Number of Children: 2  . Years of Education: N/A   Occupational History  .     Social History Main Topics  . Smoking status: Former Smoker -- 2.00 packs/day for 25 years    Types: Cigarettes  . Smokeless tobacco: Never Used     Comment: Quit in 1990's  . Alcohol Use: Yes     Comment: 08/24/2013 "might have a drink couple times/yr"  . Drug Use: No  . Sexual Activity: No   Other Topics Concern  . Not on file   Social History Narrative  . No narrative on file    Prior to Admission medications   Medication  Sig Start Date End Date Taking? Authorizing Provider  aspirin 81 MG tablet Take 81 mg by mouth daily.   Yes Historical Provider, MD  atorvastatin (LIPITOR) 40 MG tablet Take 40 mg by mouth every evening.  06/04/13  Yes Historical Provider, MD  cetirizine (ZYRTEC) 10 MG tablet Take 10 mg by mouth daily as needed.    Yes Historical Provider, MD  diltiazem (TIAZAC) 120 MG 24 hr capsule Take 120 mg by mouth daily.   Yes Historical Provider, MD  guaiFENesin-codeine (ROBITUSSIN AC) 100-10 MG/5ML syrup Take 5 mLs by mouth 2 (two) times daily as needed for cough. 07/28/13  Yes Jackolyn Confer, MD  Ipratropium-Albuterol (COMBIVENT RESPIMAT) 20-100 MCG/ACT AERS respimat Inhale 1 puff into the lungs as needed for wheezing. Every 6 hours as needed   Yes Historical Provider, MD  loperamide (IMODIUM) 2 MG capsule Take 2 mg by mouth as needed for  diarrhea or loose stools.   Yes Historical Provider, MD  metoprolol tartrate (LOPRESSOR) 25 MG tablet Take 25 mg by mouth daily. 09/23/12  Yes Wellington Hampshire, MD  ondansetron (ZOFRAN) 8 MG tablet Take 1 tablet (8 mg total) by mouth every 6 (six) hours as needed. 11/10/12  Yes Jackolyn Confer, MD  ranitidine (ZANTAC) 150 MG tablet Take 150 mg by mouth 2 (two) times daily.     Yes Historical Provider, MD  sevelamer (RENVELA) 800 MG tablet Take 800 mg by mouth 3 (three) times daily with meals.     Yes Historical Provider, MD    Current Facility-Administered Medications  Medication Dose Route Frequency Provider Last Rate Last Dose  . 0.9 %  sodium chloride infusion  250 mL Intravenous PRN Roger A Arguello, PA-C      . acetaminophen (TYLENOL) tablet 650 mg  650 mg Oral Q4H PRN Roger A Arguello, PA-C      . ALPRAZolam Duanne Moron) tablet 0.25 mg  0.25 mg Oral BID PRN Roger A Arguello, PA-C      . amLODipine (NORVASC) tablet 10 mg  10 mg Oral Daily Roger A Arguello, PA-C   10 mg at 08/25/13 1014  . aspirin EC tablet 81 mg  81 mg Oral Daily Roger A Arguello, PA-C   81 mg at 08/25/13 1014  . atorvastatin (LIPITOR) tablet 20 mg  20 mg Oral q1800 Roger A Arguello, PA-C      . ipratropium-albuterol (DUONEB) 0.5-2.5 (3) MG/3ML nebulizer solution 3 mL  3 mL Nebulization Q6H Pixie Casino, MD   3 mL at 08/25/13 1351  . loratadine (CLARITIN) tablet 10 mg  10 mg Oral Daily Rhonda G Barrett, PA-C   10 mg at 08/25/13 1013  . metoprolol tartrate (LOPRESSOR) tablet 25 mg  25 mg Oral BID Roger A Arguello, PA-C   25 mg at 08/25/13 1012  . nitroGLYCERIN (NITROSTAT) SL tablet 0.4 mg  0.4 mg Sublingual Q5 Min x 3 PRN Roger A Arguello, PA-C      . ondansetron (ZOFRAN) injection 4 mg  4 mg Intravenous Q6H PRN Roger A Arguello, PA-C      . pantoprazole (PROTONIX) injection 40 mg  40 mg Intravenous Q12H Roger A Arguello, PA-C   40 mg at 08/25/13 1012  . sevelamer carbonate (RENVELA) tablet 2,400 mg  2,400 mg Oral TID WC  Pixie Casino, MD   2,400 mg at 08/24/13 1807  . sodium chloride 0.9 % injection 3 mL  3 mL Intravenous Q12H Roger A Arguello, PA-C   3 mL at 08/25/13  1000  . sodium chloride 0.9 % injection 3 mL  3 mL Intravenous PRN Roger A Arguello, PA-C      . sucralfate (CARAFATE) 1 GM/10ML suspension 1 g  1 g Oral Q6H Roger A Arguello, PA-C   1 g at 08/24/13 2241  . zolpidem (AMBIEN) tablet 5 mg  5 mg Oral QHS PRN Meriel Pica, PA-C   5 mg at 08/25/13 0038    Allergies  Allergen Reactions  . Heparin   . Tetanus Toxoids       Review of Systems:   General:  fair appetite, decreased energy, no weight gain, + weight loss, no fever  Cardiac:  + chest pain with exertion, + chest pain at rest, + SOB with exertion, no resting SOB, no PND, no orthopnea, + palpitations, + arrhythmia, no atrial fibrillation, no LE edema, no dizzy spells, no syncope  Respiratory:  + chronic exertional shortness of breath, no home oxygen, + occasional productive cough, no dry cough, no bronchitis, no wheezing, + recent small amounts of hemoptysis, no asthma, no pain with inspiration or cough, no sleep apnea, no CPAP at night  GI:   no difficulty swallowing, + reflux, + frequent heartburn, no hiatal hernia, + abdominal pain, occasional constipation, no diarrhea, no hematochezia, no hematemesis, + recent melena  GU:   no dysuria,  no frequency, no urinary tract infection, no hematuria, no enlarged prostate, no kidney stones, + chronic kidney disease, on dialysis M-W-F via left upper arm AV graft  Vascular:  no pain suggestive of claudication, no pain in feet, no leg cramps, no varicose veins, no DVT, no non-healing foot ulcer  Neuro:   no stroke, no TIA's, no seizures, occasional headaches, no temporary blindness one eye,  no slurred speech, no peripheral neuropathy, no chronic pain, no instability of gait, no memory/cognitive dysfunction  Musculoskeletal: no arthritis, no joint swelling, no myalgias, no difficulty walking,  normal mobility   Skin:   no rash, no itching, no skin infections, no pressure sores or ulcerations  Psych:   no anxiety, no depression, no nervousness, no unusual recent stress  Eyes:   + blurry vision, no floaters, no recent vision changes, does not wears glasses or contacts  ENT:   no hearing loss, no loose or painful teeth  Hematologic:  no easy bruising, no abnormal bleeding, no clotting disorder, no frequent epistaxis  Endocrine:  no diabetes, does not check CBG's at home     Physical Exam:   BP 131/53  Pulse 76  Temp(Src) 98.3 F (36.8 C) (Oral)  Resp 18  Ht 5\' 7"  (1.702 m)  Wt 56.972 kg (125 lb 9.6 oz)  BMI 19.67 kg/m2  SpO2 96%  General:  Thin, somewhat frail-appearing  HEENT:  Unremarkable   Neck:   no JVD, no bruits, no adenopathy   Chest:   clear to auscultation, symmetrical breath sounds, no wheezes, no rhonchi   CV:   RRR, grade IV/VI systolic murmur heard all across precordium  Abdomen:  soft, non-tender, no masses, colostomy in place  Extremities:  warm, well-perfused, pulses not palpable  Rectal/GU  Deferred  Neuro:   Grossly non-focal and symmetrical throughout  Skin:   Clean and dry, no rashes, no breakdown  Diagnostic Tests:  TRANSTHORACIC ECHOCARDIOGRAM  Both images and report of transthoracic echocardiogram performed at [facility] are reviewed.  There was severe calcific aortic stenosis. Aortic valve appears tricuspid. All 3 leaflets are restricted. Peak velocity across the aortic valve measured  4.1 m/s corresponding to peak and mean transvalvular gradients of 69 and 45 mm mercury respectively. There was moderate global left ventricular systolic dysfunction with ejection fraction estimated 40-45%. There is moderate left ventricular hypertrophy. There is mild mitral regurgitation and mild mitral stenosis.    CARDIAC CATHETERIZATION  Both images and report of diagnostic cardiac catheterization performed at [facility] are reviewed.  Left ventriculogram was not performed in transvalvular gradient across the aortic valve was not measured. There was diffuse calcification of all of the epicardial coronary arteries but overall only mild nonobstructive disease with exception of possible ostial stenosis of the left main coronary artery. This was not easily visualized on catheterization but by report there was catheter damping with engagement, and the degree of stenosis was subsequently estimated to be 70%. There was right dominant coronary circulation.Pulmonary artery pressures were normal at 28/10 with pulmonary capillary wedge pressure 11 and central venous pressure 3. Fick cardiac output was measured 7.97 L per minute corresponding to a cardiac index of 4.84.    CT ANGIOGRAPHY CHEST WITH CONTRAST  TECHNIQUE:  Multidetector CT imaging of the chest was performed using the  standard protocol during bolus administration of intravenous  contrast. Multiplanar CT image reconstructions including MIPs were  obtained to evaluate the vascular anatomy.  CONTRAST: 168mL OMNIPAQUE IOHEXOL 350 MG/ML SOLN  COMPARISON: DG CHEST 2 VIEW dated 08/25/2013; CT ANGIO CHEST dated  05/29/2013  FINDINGS:  Contrast within the pulmonary arterial tree is normal in appearance.  There are no filling defects to suggest an acute pulmonary embolism.  The caliber of the thoracic aorta is normal. No false lumen is  demonstrated. The cardiac chambers are mildly enlarged and stable.  There is no pericardial or pleural effusion. Coronary artery  calcifications are demonstrated. Calcifications of the aortic and  mitral valve are stable. No bulky mediastinal or hilar lymph nodes  are demonstrated. The caliber of the thoracic esophagus appears  normal.  There is abnormal parenchymal consolidation in the right upper lobe  consistent with a thick-walled cavitary lesion. Allowing for  differences in positioning this has not significantly changed  since  the previous study. Density previously demonstrated in the posterior  superior aspect of the right lower lobe has improved and increased  interstitial density posteriorly in the left lower lobe is less  prominent today.  Within the upper abdomen the observed portions of the liver and  spleen appear normal. The right kidney is atrophic where visualized.  Calcification of the visualized portions of the left adrenal gland  is demonstrated. Calcification of the pleura over the hemidiaphragms  is stable.  The observed portions of the ribs exhibit no acute abnormalities.  The thoracic vertebral bodies are preserved in height with the  exception of T5 which exhibits mild anterior wedging which is  stable. There is prominent thoracic kyphosis.  Review of the MIP images confirms the above findings.  IMPRESSION:  1. There is no evidence of an acute pulmonary embolism or acute  thoracic aortic pathology.  2. The small bilateral pleural effusions seen on the study of  November 2014 have resolved.  3. There is stable increased density in the right upper lobe. Patchy  interstitial density in the lower lobes bilaterally has resolved.  4. Extensive pleural calcifications are present bilaterally.  Electronically Signed  By: David Martinique  On: 08/25/2013 15:22    Both reports and images of CT angiogram of the chest and PET/CT scan performed in November 2014 have been  reviewed. The results of these scans are not available in EPIC but can be seen in [place].  CT angiogram of the chest demonstrated stable chronic fibrosis with cavitary lesion in the right upper lobe consistent with the patient's known primary lung cancer previously treated with radiation therapy. There was also by report some suggestion of possible lymphangitic spread of tumor diffusely through both lungs.  PET/CT scan revealed increased metabolic uptake within the middle of the cavitary mass in the right upper lobe consistent with  persistent or recurrent lung cancer. There was no other signs of increased uptake elsewhere in the mediastinum, chest, or anywhere else in the body to suggest distant metastatic disease.   Impression:  The patient has severe symptomatic aortic stenosis with moderate global left ventricular systolic dysfunction. The patient also appears to have ostial left main coronary artery stenosis, although the degree of stenosis is not impressive by catheterization imaging and the functional significance remains unclear.   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 The patient has done unremarkably well and remained in clinical remission, although PET/CT scan performed last November clearly demonstrates persistent and or recurrent disease in the right upper lobe.  Repeat chest CT scan performed earlier today remains stable.    The patient originally presented several days ago with persistent burning epigastric chest pain that was relieved by GI cocktail in the emergency department at [facility] associated with melanotic stool and severe iron deficient anemia requiring transfusion.  He ruled in for subendocardial myocardial infarction based on serial cardiac enzymes which subsequently prompted his cardiac evaluation.     The patient's circumstances are further complicated by the fact that he has a reported allergy to heparin dating back to his hospitalization at [facility].  We do not have records as to whether or not he develop signs of HITT or some other type of allergic reaction.  Overall I am skeptical that this patient should be considered candidate for conventional surgical aortic valve replacement with or without coronary artery bypass grafting.  Cardiac gated CT scan and/or repeat catheterization with IVUS could be considered to reevaluate the  functional significance of left main coronary stenosis. If the patient's left main disease is not felt to be significant, transcatheter aortic valve replacement could be considered as an alternative to extremely high-risk conventional surgery.      Plan:  At this point I recommend GI medicine consultation for upper endoscopy.   We'll get pulmonary function tests and discuss further options for investigating the significance of the patient's left main disease.  If aggressive therapy is contemplated, repeat PET/CT scan should be performed to look for signs of further progression of the patient's underlying lung cancer.     Valentina Gu. Roxy Manns, MD 08/25/2013 2:50 PM  I spent in excess of 90 minutes of time directly involved in the conduct of this consultation.

## 2013-08-25 NOTE — Progress Notes (Signed)
Chaplain responded to spiritual care consult. Patient said he has long medical history and continued health problems, including heart problems, no intestines, no kidneys, regular dialysis, severe weight loss. Pt lives with his stepson. Pt's wife is in nursing facility with dementia. Pt said she seems to know who he is and who most of the family are. He and his family requested prayer. Chaplain provided caring presence, empathic listening, emotional and spiritual support, and prayer.   Ethelene Browns [phone]

## 2013-08-25 NOTE — Progress Notes (Signed)
INITIAL NUTRITION ASSESSMENT  DOCUMENTATION CODES Per approved criteria  -Not Applicable   INTERVENTION:  Diet advancement as able post procedure(s) per MD; recommend regular diet.  RD to add supplements as needed once diet is advanced.  NUTRITION DIAGNOSIS: Inadequate oral intake related to altered GI function as evidenced by NPO status.   Goal: Intake to meet >90% of estimated nutrition needs.  Monitor:  Diet advancement, PO intake, labs, weight trend.  Reason for Assessment: MST  73 y.o. male  Admitting Dx: Severe aortic stenosis  ASSESSMENT: Patient admitted on 2/2 with NSTEMI in the setting of dark stools with anemia. History of ESRD, on HD, lung CA noted. GI has been consulted for melena and anemia. CVTS has been consulted for possibility for CABG/AVR.  Remains NPO until GI consult is completed. Patient is currently in CT. Unable to complete nutrition focused physical exam at this time. Unable to speak with patient for nutrition history. Patient is at nutrition risk given recent 8% weight loss in the past month. With extensive medical history and recent weight loss, suspect patient could be malnourished.  Height: Ht Readings from Last 1 Encounters:  08/24/13 5\' 7"  (1.702 m)    Weight: Wt Readings from Last 1 Encounters:  08/25/13 125 lb 9.6 oz (56.972 kg)    Ideal Body Weight: 61.4 kg  % Ideal Body Weight: 93%  Wt Readings from Last 10 Encounters:  08/25/13 125 lb 9.6 oz (56.972 kg)  07/28/13 136 lb (61.689 kg)  04/16/13 136 lb (61.689 kg)  11/10/12 130 lb (58.968 kg)  10/21/12 131 lb 4 oz (59.535 kg)  07/17/12 136 lb 8 oz (61.916 kg)  04/29/12 140 lb 8 oz (63.73 kg)  03/13/12 140 lb (63.504 kg)  01/17/12 143 lb 8 oz (65.091 kg)  11/06/11 161 lb (73.029 kg)    Usual Body Weight: 136 lb (1 month ago)  % Usual Body Weight: 92%  BMI:  Body mass index is 19.67 kg/(m^2).  Estimated Nutritional Needs: Kcal: 1700-1900 Protein: 85-95 gm Fluid: 1.7-1.9  L  Skin: no wounds  Diet Order: NPO  EDUCATION NEEDS: -Education not appropriate at this time   Intake/Output Summary (Last 24 hours) at 08/25/13 1451 Last data filed at 08/24/13 2100  Gross per 24 hour  Intake    240 ml  Output      0 ml  Net    240 ml    Last BM: 2/2   Labs:   Recent Labs Lab 08/25/13 0305  NA 141  K 4.6  CL 100  CO2 29  BUN 37*  CREATININE 4.82*  CALCIUM 9.2  PHOS 3.5  GLUCOSE 72    CBG (last 3)  No results found for this basename: GLUCAP,  in the last 72 hours  Scheduled Meds: . amLODipine  10 mg Oral Daily  . aspirin EC  81 mg Oral Daily  . atorvastatin  20 mg Oral q1800  . ipratropium-albuterol  3 mL Nebulization Q6H  . loratadine  10 mg Oral Daily  . metoprolol tartrate  25 mg Oral BID  . pantoprazole (PROTONIX) IV  40 mg Intravenous Q12H  . sevelamer carbonate  2,400 mg Oral TID WC  . sodium chloride  3 mL Intravenous Q12H  . sucralfate  1 g Oral Q6H    Continuous Infusions: None  Past Medical History  Diagnosis Date  . Colostomy in place   . Abdominal aortic aneurysm 02/2007    Ruptured, hospitalized at [facility] for 52 days, c/b loss  of colon and renal function. Followed by Dr Ronalee Belts  . Hyperlipidemia   . Atherosclerosis   . Esophageal reflux   . Hypertension   . PSVT (paroxysmal supraventricular tachycardia)   . Aortic stenosis   . Lung cancer 2009    Initial clinical stage IIIB - treated with radiation + chemotherapy  . Heart murmur   . ESRD (end stage renal disease) on dialysis     "[facility]; [place]; [place], Wed & Fri,  Dr Holley Raring" (08/24/2013)  . Exertional shortness of breath   . Anemia   . History of blood transfusion 2008    "related to ruptured AAA" (08/24/2013)  . Arthritis     "fingers"   . GERD (gastroesophageal reflux disease)   . Chronic diastolic congestive heart failure   . S/P radiation therapy > 12 wks ago   . S/P chemotherapy, time since greater than 12 weeks   . 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  Hypoalbuminemia   . Anemia in chronic kidney disease   . GI (gastrointestinal bleed)   . Heparin allergy   . Stage 4 lung cancer 05/2013    likely lymphangitic spread of tumor on CTA chest  . Non-STEMI (non-ST elevated myocardial infarction)   . Malnutrition     Past Surgical History  Procedure Laterality Date  . Colostomy  2008  . Bowel resection  2008    partial colectomy for ischemic colitis after emergency repair of ruptured AAA  . Abdominal aortic aneurysm repair  02/2007    [facility]:  emergency repair for ruptured AAA  . Arteriovenous graft placement Left 2008    "arm"  . Pilonidal cyst / sinus excision    . Cataract extraction w/ intraocular lens implant Right   . Nephrectomy      following repair of ruptured AAA     Molli Barrows, RD, LDN, [place] Pager [phone] After Hours Pager [phone]

## 2013-08-25 NOTE — Progress Notes (Signed)
Up and ambulating in the room.  Client remains NPO until GI consult is completed.  Received am meds with a sip of water.  Family members at bedside.

## 2013-08-25 NOTE — Progress Notes (Signed)
SUBJECTIVE: no complaints except for being hungry   OBJECTIVE:   Vitals:   Filed Vitals:   08/24/13 2033 08/25/13 0523 08/25/13 0527 08/25/13 0841  BP:  119/71  105/60  Pulse:  100  86  Temp:  98.2 F (36.8 C)  98.3 F (36.8 C)  TempSrc:  Oral  Oral  Resp:  18  17  Height:      Weight:   125 lb 9.6 oz (56.972 kg)   SpO2: 95% 99%  100%   I&O's:   Intake/Output Summary (Last 24 hours) at 08/25/13 0949 Last data filed at 08/24/13 2100  Gross per 24 hour  Intake    240 ml  Output      0 ml  Net    240 ml   TELEMETRY: Reviewed telemetry pt in :NSR     PHYSICAL EXAM General: Well developed, well nourished, in no acute distress Head: Eyes PERRLA, No xanthomas.   Normal cephalic and atramatic  Lungs:   Clear bilaterally to auscultation and percussion. Heart:   HRRR S1 S2 Pulses are 2+ & equal. Loud harsh 2/6 late peaking SM at RUSB to LLSB and apex Abdomen: Bowel sounds are positive, abdomen soft and non-tender without masses  Extremities:   No clubbing, cyanosis or edema.  DP +1 Neuro: Alert and oriented X 3. Psych:  Good affect, responds appropriately   LABS: Basic Metabolic Panel:  Recent Labs  08/25/13 0305  NA 141  K 4.6  CL 100  CO2 29  GLUCOSE 72  BUN 37*  CREATININE 4.82*  CALCIUM 9.2   Liver Function Tests:  Recent Labs  08/25/13 0305  AST 12  ALT 9  ALKPHOS 74  BILITOT 0.4  PROT 6.1  ALBUMIN 2.9*   No results found for this basename: LIPASE, AMYLASE,  in the last 72 hours CBC:  Recent Labs  08/25/13 0305  WBC 7.5  HGB 9.4*  HCT 27.7*  MCV 101.8*  PLT 229   Cardiac Enzymes: No results found for this basename: CKTOTAL, CKMB, CKMBINDEX, TROPONINI,  in the last 72 hours BNP: No components found with this basename: POCBNP,  D-Dimer: No results found for this basename: DDIMER,  in the last 72 hours Hemoglobin A1C: No results found for this basename: HGBA1C,  in the last 72 hours Fasting Lipid Panel:  Recent Labs  08/25/13 0319    CHOL 98  HDL 54  LDLCALC 30  TRIG 71  CHOLHDL 1.8   Thyroid Function Tests: No results found for this basename: TSH, T4TOTAL, FREET3, T3FREE, THYROIDAB,  in the last 72 hours Anemia Panel: No results found for this basename: VITAMINB12, FOLATE, FERRITIN, TIBC, IRON, RETICCTPCT,  in the last 72 hours Coag Panel:   Lab Results  Component Value Date   INR 0.95 08/25/2013    RADIOLOGY: Dg Chest 2 View  08/25/2013   CLINICAL DATA:  Shortness of Breath  EXAM: CHEST  2 VIEW  COMPARISON:  August 21, 2013  FINDINGS: There is persistent volume loss in the right upper lobe with a cavitary lesion which appear stable. There is mild scarring in the bases. There is calcification along both hemidiaphragms as well as along several pleural surfaces consistent with previous asbestos exposure. There is some localized pleural thickening in the left mid hemi thorax, stable. There is no new opacity. There is no edema or pulmonary venous hypertension. No cardiomegaly. There is a stent in the innominate vein regions.  IMPRESSION: No appreciable change from the most recent prior  study. Cavitary lesion with a cicatrization in the right upper lobe, stable. Evidence of previous asbestos exposure. No frank edema or consolidation. No overt congestive heart failure.   Electronically Signed   By: Lowella Grip M.D.   On: 08/25/2013 08:05      ASSESSMENT:  1.  NSTEMI in the setting of dark stools with anemia 2.  Severe AS 3.  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  Stage IIIB lung CA felt previously to be in remission 5.  ESRD 6.  History of AAA rupture s/p repair 7.  Heparin allergy 8.  Melena with anemia 9.  RBBB/LAFB and LVH 10.  Mild LV dysfunction by echo EF 45%  PLAN:   1.  Dr. Roxy Manns with CVTS has done a preliminary consult and will review cath films and echo as well as recent PET and CT scans regarding lung CA before determining if he is a  candidate for CABG/AVR 2.  GI consult for melena and anemia 3.  Renal consult for ESRD on HD 4.  Continue ASA/beta blocker/statin 5.  Continue Protonix and Carafate 6.  Continue beta blocker and amlodipine for HTN  Sueanne Margarita, MD  08/25/2013  9:49 AM

## 2013-08-25 NOTE — Care Management Note (Unsigned)
    Page 1 of 1   08/25/2013     4:54:10 PM   CARE MANAGEMENT NOTE 08/25/2013  Patient:  Gerald Cooke, Gerald Cooke   Account Number:  [id number]  Date Initiated:  08/25/2013  Documentation initiated by:  GRAVES-BIGELOW,Reise Hietala  Subjective/Objective Assessment:   Pt admitted from [facility]. Hx ESRD. 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 Plan for CVTS consult for possible CABG/AVR.     Action/Plan:   CM will continue to monitor for disposition needs.   Anticipated DC Date:  08/28/2013   Anticipated DC Plan:  [place]  CM consult      Choice offered to / List presented to:             Status of service:  In process, will continue to follow Medicare Important Message given?   (If response is "NO", the following Medicare IM given date fields will be blank) Date Medicare IM given:   Date Additional Medicare IM given:    Discharge Disposition:    Per UR Regulation:  Reviewed for med. necessity/level of care/duration of stay  If discussed at [place] of Stay Meetings, dates discussed:    Comments:

## 2013-08-26 ENCOUNTER — Encounter (HOSPITAL_COMMUNITY): Payer: Medicare Other

## 2013-08-26 ENCOUNTER — Encounter (HOSPITAL_COMMUNITY): Payer: Self-pay | Admitting: Thoracic Surgery (Cardiothoracic Vascular Surgery)

## 2013-08-26 ENCOUNTER — Other Ambulatory Visit (HOSPITAL_COMMUNITY): Payer: Self-pay | Admitting: Thoracic Surgery (Cardiothoracic Vascular Surgery)

## 2013-08-26 DIAGNOSIS — N186 End stage renal disease: Secondary | ICD-10-CM | POA: Diagnosis present

## 2013-08-26 DIAGNOSIS — J449 Chronic obstructive pulmonary disease, unspecified: Secondary | ICD-10-CM

## 2013-08-26 DIAGNOSIS — D631 Anemia in chronic kidney disease: Secondary | ICD-10-CM

## 2013-08-26 DIAGNOSIS — I350 Nonrheumatic aortic (valve) stenosis: Secondary | ICD-10-CM

## 2013-08-26 DIAGNOSIS — I5032 Chronic diastolic (congestive) heart failure: Secondary | ICD-10-CM

## 2013-08-26 DIAGNOSIS — N189 Chronic kidney disease, unspecified: Secondary | ICD-10-CM

## 2013-08-26 DIAGNOSIS — N039 Chronic nephritic syndrome with unspecified morphologic changes: Secondary | ICD-10-CM

## 2013-08-26 DIAGNOSIS — Z992 Dependence on renal dialysis: Secondary | ICD-10-CM

## 2013-08-26 DIAGNOSIS — I509 Heart failure, unspecified: Secondary | ICD-10-CM

## 2013-08-26 LAB — PULMONARY FUNCTION TEST
FEF 25-75 PRE: 2.48 L/s
FEF 25-75 Post: 0.79 L/sec
FEF2575-%CHANGE-POST: -68 %
FEF2575-%Pred-Post: 37 %
FEF2575-%Pred-Pre: 116 %
FEV1-%Change-Post: -21 %
FEV1-%PRED-POST: 50 %
FEV1-%Pred-Pre: 64 %
FEV1-Post: 1.44 L
FEV1-Pre: 1.83 L
FEV1FVC-%Change-Post: -16 %
FEV1FVC-%Pred-Pre: 118 %
FEV6-%Change-Post: 1 %
FEV6-%PRED-PRE: 54 %
FEV6-%Pred-Post: 55 %
FEV6-PRE: 1.98 L
FEV6-Post: 2 L
FEV6FVC-%PRED-POST: 106 %
FEV6FVC-%PRED-PRE: 106 %
FVC-%Change-Post: -5 %
FVC-%PRED-PRE: 54 %
FVC-%Pred-Post: 51 %
FVC-PRE: 2.11 L
FVC-Post: 2 L
PRE FEV6/FVC RATIO: 100 %
Post FEV1/FVC ratio: 72 %
Post FEV6/FVC ratio: 100 %
Pre FEV1/FVC ratio: 86 %

## 2013-08-26 LAB — BASIC METABOLIC PANEL
BUN: 50 mg/dL — AB (ref 6–23)
CALCIUM: 9.7 mg/dL (ref 8.4–10.5)
CO2: 25 mEq/L (ref 19–32)
Chloride: 101 mEq/L (ref 96–112)
Creatinine, Ser: 6.5 mg/dL — ABNORMAL HIGH (ref 0.50–1.35)
GFR calc Af Amer: 9 mL/min — ABNORMAL LOW (ref >90)
GFR, EST NON AFRICAN AMERICAN: 8 mL/min — AB (ref >90)
Glucose, Bld: 91 mg/dL (ref 70–99)
Potassium: 5.4 mEq/L — ABNORMAL HIGH (ref 3.7–5.3)
SODIUM: 141 meq/L (ref 137–147)

## 2013-08-26 LAB — URINALYSIS, ROUTINE W REFLEX MICROSCOPIC
BILIRUBIN URINE: NEGATIVE
GLUCOSE, UA: 100 mg/dL — AB
Hgb urine dipstick: NEGATIVE
KETONES UR: NEGATIVE mg/dL
Leukocytes, UA: NEGATIVE
Nitrite: NEGATIVE
Protein, ur: 100 mg/dL — AB
Specific Gravity, Urine: 1.019 (ref 1.005–1.030)
Urobilinogen, UA: 0.2 mg/dL (ref 0.0–1.0)
pH: 8.5 — ABNORMAL HIGH (ref 5.0–8.0)

## 2013-08-26 LAB — CBC
HCT: 29.7 % — ABNORMAL LOW (ref 39.0–52.0)
Hemoglobin: 9.7 g/dL — ABNORMAL LOW (ref 13.0–17.0)
MCH: 33.8 pg (ref 26.0–34.0)
MCHC: 32.7 g/dL (ref 30.0–36.0)
MCV: 103.5 fL — ABNORMAL HIGH (ref 78.0–100.0)
PLATELETS: 241 10*3/uL (ref 150–400)
RBC: 2.87 MIL/uL — AB (ref 4.22–5.81)
RDW: 15.9 % — ABNORMAL HIGH (ref 11.5–15.5)
WBC: 7.1 10*3/uL (ref 4.0–10.5)

## 2013-08-26 LAB — URINE MICROSCOPIC-ADD ON

## 2013-08-26 LAB — HEPATITIS B SURFACE ANTIGEN: Hepatitis B Surface Ag: NEGATIVE

## 2013-08-26 MED ORDER — IPRATROPIUM-ALBUTEROL 0.5-2.5 (3) MG/3ML IN SOLN: 3.0000 mL | RESPIRATORY_TRACT | Status: DC | PRN

## 2013-08-26 NOTE — Progress Notes (Signed)
Patient seen and examined.  Appreciate CVTS input.  Chest CT yesterday shows stable density in the RUL with resolution of prior patchy densities seen on prior scan.  GI will see today to address his anemia and recent history of melanotic stools.  He is high risk for AVR/CABG due to significant comorbidities.  Agree with Dr. Roxy Manns that better option may be IVUS of LM and if not significant then TAVR.  Await further recs from CVTS.

## 2013-08-26 NOTE — Procedures (Signed)
I was present at this dialysis session. I have reviewed the session itself and made appropriate changes.   Plan developig for cardiac issues. EGD tomorrow. Below EDW today, low ASx bps.  No UF.   [name]  MD 08/26/2013, 1:34 PM

## 2013-08-26 NOTE — Progress Notes (Signed)
TCTS BRIEF PROGRESS NOTE   Clinically stable.  Plans for EGD noted.  Will continue to follow.  Rexene Alberts 08/26/2013 5:33 PM

## 2013-08-26 NOTE — Consult Note (Signed)
[facility] Consultation Note  Referring Provider: Dr. Darylene Price (CVTS) Primary Care Physician:  Rica Mast, MD  Reason for Consultation:  Melena, anemia  HPI: Gerald Cooke is a 73 y.o. male admitted for evaluation of severe and symptomatic aortic stenosis.  We were asked to see for evaluation of melena.  One week ago, patient had a few days' worth of dark black stools, since resolved.  Some nausea, vomiting.  No abdominal pain.  No hematemesis.  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  At present, no stool or blood in colostomy bag.  Denies NSAIDs other than ASA.  Denies blood thinners.  Denies prior endoscopy.   Past Medical History  Diagnosis Date  . Colostomy in place   . Abdominal aortic aneurysm 02/2007    Ruptured, hospitalized at [facility] for 52 days, c/b loss of colon and renal function. Followed by Dr Ronalee Belts  . Hyperlipidemia   . Atherosclerosis   . Esophageal reflux   . Hypertension   . PSVT (paroxysmal supraventricular tachycardia)   . Aortic stenosis   . Lung cancer 2009    Initial clinical stage IIIB - treated with radiation + chemotherapy  . Heart murmur   . ESRD (end stage renal disease) on dialysis     "[facility]; [place]; [place], Wed & Fri,  Dr Holley Raring" (08/24/2013)  . Exertional shortness of breath   . Anemia   . History of blood transfusion 2008    "related to ruptured AAA" (08/24/2013)  . Arthritis     "fingers"   . GERD (gastroesophageal reflux disease)   . Chronic diastolic congestive heart failure   . S/P radiation therapy > 12 wks ago   . S/P chemotherapy, time since greater than 12 weeks   . 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 Hypoalbuminemia   . Anemia in chronic kidney disease   . GI (gastrointestinal bleed)   . Heparin allergy   . Non-STEMI (non-ST elevated myocardial infarction)   . Malnutrition     Past Surgical History  Procedure Laterality Date  . Colostomy  2008   . Bowel resection  2008    partial colectomy for ischemic colitis after emergency repair of ruptured AAA  . Abdominal aortic aneurysm repair  02/2007    [facility]:  emergency repair for ruptured AAA  . Arteriovenous graft placement Left 2008    "arm"  . Pilonidal cyst / sinus excision    . Cataract extraction w/ intraocular lens implant Right   . Nephrectomy      following repair of ruptured AAA    Prior to Admission medications   Medication Sig Start Date End Date Taking? Authorizing Provider  aspirin 81 MG tablet Take 81 mg by mouth daily.   Yes Historical Provider, MD  atorvastatin (LIPITOR) 40 MG tablet Take 40 mg by mouth every evening.  06/04/13  Yes Historical Provider, MD  cetirizine (ZYRTEC) 10 MG tablet Take 10 mg by mouth daily as needed.    Yes Historical Provider, MD  diltiazem (TIAZAC) 120 MG 24 hr capsule Take 120 mg by mouth daily.   Yes Historical Provider, MD  guaiFENesin-codeine (ROBITUSSIN AC) 100-10 MG/5ML syrup Take 5 mLs by mouth 2 (two) times daily as needed for cough. 07/28/13  Yes Jackolyn Confer, MD  Ipratropium-Albuterol (COMBIVENT RESPIMAT) 20-100 MCG/ACT AERS respimat Inhale 1 puff into the lungs as needed for wheezing. Every 6 hours as needed   Yes Historical Provider, MD  loperamide (IMODIUM) 2 MG capsule Take 2 mg by mouth as needed for diarrhea or loose stools.   Yes Historical Provider, MD  metoprolol tartrate (LOPRESSOR) 25 MG tablet Take 25 mg by mouth daily. 09/23/12  Yes Wellington Hampshire, MD  ondansetron (ZOFRAN) 8 MG tablet Take 1 tablet (8 mg total) by mouth every 6 (six) hours as needed. 11/10/12  Yes Jackolyn Confer, MD  ranitidine (ZANTAC) 150 MG tablet Take 150 mg by mouth 2 (two) times daily.     Yes Historical Provider, MD  sevelamer (RENVELA) 800 MG tablet Take 800 mg by mouth 3 (three) times daily with meals.     Yes Historical Provider, MD    Current Facility-Administered Medications  Medication Dose Route Frequency Provider Last Rate Last  Dose  . 0.9 %  sodium chloride infusion  250 mL Intravenous PRN Roger A Arguello, PA-C      . acetaminophen (TYLENOL) tablet 650 mg  650 mg Oral Q4H PRN Roger A Arguello, PA-C      . ALPRAZolam Duanne Moron) tablet 0.25 mg  0.25 mg Oral BID PRN Roger A Arguello, PA-C      . amLODipine (NORVASC) tablet 10 mg  10 mg Oral Daily Roger A Arguello, PA-C   10 mg at 08/25/13 1014  . aspirin EC tablet 81 mg  81 mg Oral Daily Roger A Arguello, PA-C   81 mg at 08/26/13 1032  . atorvastatin (LIPITOR) tablet 20 mg  20 mg Oral q1800 Roger A Arguello, PA-C   20 mg at 08/25/13 1710  . ipratropium-albuterol (DUONEB) 0.5-2.5 (3) MG/3ML nebulizer solution 3 mL  3 mL Nebulization Q6H Pixie Casino, MD   3 mL at 08/26/13 0941  . loratadine (CLARITIN) tablet 10 mg  10 mg Oral Daily Rhonda G Barrett, PA-C   10 mg at 08/26/13 1032  . metoprolol tartrate (LOPRESSOR) tablet 25 mg  25 mg Oral BID Roger A Arguello, PA-C   25 mg at 08/25/13 2133  . nitroGLYCERIN (NITROSTAT) SL tablet 0.4 mg  0.4 mg Sublingual Q5 Min x 3 PRN Roger A Arguello, PA-C      . ondansetron (ZOFRAN) injection 4 mg  4 mg Intravenous Q6H PRN Roger A Arguello, PA-C      . pantoprazole (PROTONIX) injection 40 mg  40 mg Intravenous Q12H Roger A Arguello, PA-C   40 mg at 08/26/13 1032  . sevelamer carbonate (RENVELA) tablet 2,400 mg  2,400 mg Oral TID WC Pixie Casino, MD   2,400 mg at 08/26/13 0756  . sodium chloride 0.9 % injection 3 mL  3 mL Intravenous Q12H Roger A Arguello, PA-C   3 mL at 08/25/13 2134  . sodium chloride 0.9 % injection 3 mL  3 mL Intravenous PRN Roger A Arguello, PA-C   3 mL at 08/26/13 1032  . sucralfate (CARAFATE) 1 GM/10ML suspension 1 g  1 g Oral Q6H Roger A Arguello, PA-C   1 g at 08/26/13 5053  . zolpidem (AMBIEN) tablet 5 mg  5 mg Oral QHS PRN Meriel Pica, PA-C   5 mg at 08/25/13 2141    Allergies as of 08/24/2013 - Review Complete 08/24/2013  Allergen Reaction Noted  . Heparin  06/15/2011  . Tetanus toxoids   06/15/2011    History reviewed. No pertinent family history.  History   Social History  . Marital Status: Married    Spouse Name: N/A    Number of Children: 2  . Years of Education: N/A  Occupational History  .     Social History Main Topics  . Smoking status: Former Smoker -- 2.00 packs/day for 25 years    Types: Cigarettes  . Smokeless tobacco: Never Used     Comment: Quit in 1990's  . Alcohol Use: Yes     Comment: 08/24/2013 "might have a drink couple times/yr"  . Drug Use: No  . Sexual Activity: No   Other Topics Concern  . Not on file   Social History Narrative  . No narrative on file    Review of Systems: ROS Dr. Guy Sandifer note 08/25/13 reviewed and I agree  Physical Exam: Vital signs in last 24 hours: Temp:  [98 F (36.7 C)-98.3 F (36.8 C)] 98.2 F (36.8 C) (02/04 0400) Pulse Rate:  [72-96] 82 (02/04 0400) Resp:  [16-18] 16 (02/04 0231) BP: (103-131)/(48-58) 119/58 mmHg (02/04 0400) SpO2:  [96 %-100 %] 96 % (02/04 0941) Weight:  [56.065 kg (123 lb 9.6 oz)] 56.065 kg (123 lb 9.6 oz) (02/04 0400) Last BM Date: 08/26/13 General:   Alert,  Chronically ill-appearing, somewhat cachectic-appearing, is in no acute distress Head:  Normocephalic and atraumatic. Eyes:  Sclera clear, no icterus.   Conjunctiva slight pale Ears:  Normal auditory acuity. Nose:  No deformity, discharge,  or lesions. Mouth:  No deformity or lesions.  Oropharynx pink but somewhat dry Neck:  Supple; no masses or thyromegaly. Lungs:  Clear throughout to auscultation.   No wheezes, crackles, or rhonchi. No acute distress. Heart:  Harsh IV/VI SEM LUSB radiating to apex.  Regular rate and rhythm. Abdomen:  Soft, nontender and nondistended. No masses, hepatosplenomegaly or hernias noted. Normal bowel sounds, without guarding, and without rebound.   Colostomy right mid-abdomen.  No stool in ostomy bag at present  Msk:  Diffuse muscular atrophy and wasting, especially lower extremities Pulses:   Normal pulses noted. Extremities:  Without clubbing or edema. Neurologic:  Alert and  oriented x4;  grossly normal neurologically. Skin:  Scattered ecchymoses, otherwise intact without significant lesions or rashes. Psych:  Alert and cooperative. Normal mood and affect.   Lab Results:  Recent Labs  08/25/13 0305 08/26/13 0405  WBC 7.5 7.1  HGB 9.4* 9.7*  HCT 27.7* 29.7*  PLT 229 241   BMET  Recent Labs  08/25/13 0305 08/26/13 0405  NA 141 141  K 4.6 5.4*  CL 100 101  CO2 29 25  GLUCOSE 72 91  BUN 37* 50*  CREATININE 4.82* 6.50*  CALCIUM 9.2 9.7   LFT  Recent Labs  08/25/13 0305  PROT 6.1  ALBUMIN 2.9*  AST 12  ALT 9  ALKPHOS 74  BILITOT 0.4   PT/INR  Recent Labs  08/25/13 0305  LABPROT 12.5  INR 0.95    Studies/Results: Dg Chest 2 View  08/25/2013   CLINICAL DATA:  Shortness of Breath  EXAM: CHEST  2 VIEW  COMPARISON:  August 21, 2013  FINDINGS: There is persistent volume loss in the right upper lobe with a cavitary lesion which appear stable. There is mild scarring in the bases. There is calcification along both hemidiaphragms as well as along several pleural surfaces consistent with previous asbestos exposure. There is some localized pleural thickening in the left mid hemi thorax, stable. There is no new opacity. There is no edema or pulmonary venous hypertension. No cardiomegaly. There is a stent in the innominate vein regions.  IMPRESSION: No appreciable change from the most recent prior study. Cavitary lesion with a cicatrization in the right  upper lobe, stable. Evidence of previous asbestos exposure. No frank edema or consolidation. No overt congestive heart failure.   Electronically Signed   By: Lowella Grip M.D.   On: 08/25/2013 08:05   Ct Angio Chest Pe W/cm &/or Wo Cm  08/25/2013   CLINICAL DATA:  Rule out pulmonary embolism, follow-up lung malignancy  EXAM: CT ANGIOGRAPHY CHEST WITH CONTRAST  TECHNIQUE: Multidetector CT imaging of the chest  was performed using the standard protocol during bolus administration of intravenous contrast. Multiplanar CT image reconstructions including MIPs were obtained to evaluate the vascular anatomy.  CONTRAST:  153mL OMNIPAQUE IOHEXOL 350 MG/ML SOLN  COMPARISON:  DG CHEST 2 VIEW dated 08/25/2013; CT ANGIO CHEST dated 05/29/2013  FINDINGS: Contrast within the pulmonary arterial tree is normal in appearance. There are no filling defects to suggest an acute pulmonary embolism. The caliber of the thoracic aorta is normal. No false lumen is demonstrated. The cardiac chambers are mildly enlarged and stable. There is no pericardial or pleural effusion. Coronary artery calcifications are demonstrated. Calcifications of the aortic and mitral valve are stable. No bulky mediastinal or hilar lymph nodes are demonstrated. The caliber of the thoracic esophagus appears normal.  There is abnormal parenchymal consolidation in the right upper lobe consistent with a thick-walled cavitary lesion. Allowing for differences in positioning this has not significantly changed since the previous study. Density previously demonstrated in the posterior superior aspect of the right lower lobe has improved and increased interstitial density posteriorly in the left lower lobe is less prominent today.  Within the upper abdomen the observed portions of the liver and spleen appear normal. The right kidney is atrophic where visualized. Calcification of the visualized portions of the left adrenal gland is demonstrated. Calcification of the pleura over the hemidiaphragms is stable.  The observed portions of the ribs exhibit no acute abnormalities. The thoracic vertebral bodies are preserved in height with the exception of T5 which exhibits mild anterior wedging which is stable. There is prominent thoracic kyphosis.  Review of the MIP images confirms the above findings.  IMPRESSION: 1. There is no evidence of an acute pulmonary embolism or acute thoracic aortic  pathology. 2. The small bilateral pleural effusions seen on the study of November 2014 have resolved. 3. There is stable increased density in the right upper lobe. Patchy interstitial density in the lower lobes bilaterally has resolved. 4. Extensive pleural calcifications are present bilaterally.   Electronically Signed   By: David  Martinique   On: 08/25/2013 15:22    Impression:  1.  Melena, resolved. 2.  Anemia, unclear baseline. 3.  Ischemic colitis with colostomy, ? Any colon left. 4.  Severe aortic stenosis, among multiple other comorbidities.  Plan:  1.  Clear liquids today, NPO after midnight. 2.  PPI. 3.  Endoscopy tomorrow, 08/27/13, at request of cardiology and CVTS. 4.  Risks (bleeding, infection, bowel perforation that could require surgery, sedation-related changes in cardiopulmonary systems), benefits (identification and possible treatment of source of symptoms, exclusion of certain causes of symptoms), and alternatives (watchful waiting, radiographic imaging studies, empiric medical treatment) of upper endoscopy (EGD) were explained to patient/family in detail and patient wishes to proceed.   LOS: 2 days   Adlyn Fife M  08/26/2013, 11:03 AM

## 2013-08-26 NOTE — Progress Notes (Signed)
Subjective: Increasing SOB this AM   Objective: Vital signs in last 24 hours: Temp:  [98 F (36.7 C)-98.3 F (36.8 C)] 98.2 F (36.8 C) (02/04 0400) Pulse Rate:  [72-96] 82 (02/04 0400) Resp:  [16-18] 16 (02/04 0231) BP: (103-131)/(48-60) 119/58 mmHg (02/04 0400) SpO2:  [96 %-100 %] 98 % (02/04 0400) Weight:  [123 lb 9.6 oz (56.065 kg)] 123 lb 9.6 oz (56.065 kg) (02/04 0400) Weight change: -1 lb 4.8 oz (-0.59 kg) Last BM Date: 08/26/13 Intake/Output from previous day:  I &O +713 02/03 0701 - 02/04 0700 In: 713 [P.O.:713] Out: 80 [Urine:80] Intake/Output this shift:    PE: General:Pleasant affect, NAD Skin:Warm and dry, brisk capillary refill HEENT:normocephalic, sclera clear, mucus membranes moist Heart:S1S2 RRR with 3/6 aortic outflow murmur, no gallup, rub or click Lungs:diminished without rales, rhonchi, or wheezes ZHG:DJME, non tender, + BS, do not palpate liver spleen or masses Ext:no lower ext edema, 2+ pedal pulses, 2+ radial pulses Neuro:alert and oriented, MAE, follows commands, + facial symmetry   Lab Results:  Recent Labs  08/25/13 0305 08/26/13 0405  WBC 7.5 7.1  HGB 9.4* 9.7*  HCT 27.7* 29.7*  PLT 229 241   BMET  Recent Labs  08/25/13 0305 08/26/13 0405  NA 141 141  K 4.6 5.4*  CL 100 101  CO2 29 25  GLUCOSE 72 91  BUN 37* 50*  CREATININE 4.82* 6.50*  CALCIUM 9.2 9.7   No results found for this basename: TROPONINI, CK, MB,  in the last 72 hours  Lab Results  Component Value Date   CHOL 98 08/25/2013   HDL 54 08/25/2013   LDLCALC 30 08/25/2013   TRIG 71 08/25/2013   CHOLHDL 1.8 08/25/2013   No results found for this basename: HGBA1C     Lab Results  Component Value Date   TSH 2.501 08/25/2013    Hepatic Function Panel  Recent Labs  08/25/13 0305  PROT 6.1  ALBUMIN 2.9*  AST 12  ALT 9  ALKPHOS 74  BILITOT 0.4    Recent Labs  08/25/13 0319  CHOL 98   No results found for this basename: PROTIME,  in the last 72  hours     Studies/Results: Dg Chest 2 View  08/25/2013   CLINICAL DATA:  Shortness of Breath  EXAM: CHEST  2 VIEW  COMPARISON:  August 21, 2013  FINDINGS: There is persistent volume loss in the right upper lobe with a cavitary lesion which appear stable. There is mild scarring in the bases. There is calcification along both hemidiaphragms as well as along several pleural surfaces consistent with previous asbestos exposure. There is some localized pleural thickening in the left mid hemi thorax, stable. There is no new opacity. There is no edema or pulmonary venous hypertension. No cardiomegaly. There is a stent in the innominate vein regions.  IMPRESSION: No appreciable change from the most recent prior study. Cavitary lesion with a cicatrization in the right upper lobe, stable. Evidence of previous asbestos exposure. No frank edema or consolidation. No overt congestive heart failure.   Electronically Signed   By: Lowella Grip M.D.   On: 08/25/2013 08:05   Ct Angio Chest Pe W/cm &/or Wo Cm  08/25/2013   CLINICAL DATA:  Rule out pulmonary embolism, follow-up lung malignancy  EXAM: CT ANGIOGRAPHY CHEST WITH CONTRAST  TECHNIQUE: Multidetector CT imaging of the chest was performed using the standard protocol during bolus administration of intravenous contrast. Multiplanar  CT image reconstructions including MIPs were obtained to evaluate the vascular anatomy.  CONTRAST:  12mL OMNIPAQUE IOHEXOL 350 MG/ML SOLN  COMPARISON:  DG CHEST 2 VIEW dated 08/25/2013; CT ANGIO CHEST dated 05/29/2013  FINDINGS: Contrast within the pulmonary arterial tree is normal in appearance. There are no filling defects to suggest an acute pulmonary embolism. The caliber of the thoracic aorta is normal. No false lumen is demonstrated. The cardiac chambers are mildly enlarged and stable. There is no pericardial or pleural effusion. Coronary artery calcifications are demonstrated. Calcifications of the aortic and mitral valve are stable.  No bulky mediastinal or hilar lymph nodes are demonstrated. The caliber of the thoracic esophagus appears normal.  There is abnormal parenchymal consolidation in the right upper lobe consistent with a thick-walled cavitary lesion. Allowing for differences in positioning this has not significantly changed since the previous study. Density previously demonstrated in the posterior superior aspect of the right lower lobe has improved and increased interstitial density posteriorly in the left lower lobe is less prominent today.  Within the upper abdomen the observed portions of the liver and spleen appear normal. The right kidney is atrophic where visualized. Calcification of the visualized portions of the left adrenal gland is demonstrated. Calcification of the pleura over the hemidiaphragms is stable.  The observed portions of the ribs exhibit no acute abnormalities. The thoracic vertebral bodies are preserved in height with the exception of T5 which exhibits mild anterior wedging which is stable. There is prominent thoracic kyphosis.  Review of the MIP images confirms the above findings.  IMPRESSION: 1. There is no evidence of an acute pulmonary embolism or acute thoracic aortic pathology. 2. The small bilateral pleural effusions seen on the study of November 2014 have resolved. 3. There is stable increased density in the right upper lobe. Patchy interstitial density in the lower lobes bilaterally has resolved. 4. Extensive pleural calcifications are present bilaterally.   Electronically Signed   By: David  Martinique   On: 08/25/2013 15:22    Medications: I have reviewed the patient's current medications. Scheduled Meds: . amLODipine  10 mg Oral Daily  . aspirin EC  81 mg Oral Daily  . atorvastatin  20 mg Oral q1800  . ipratropium-albuterol  3 mL Nebulization Q6H  . loratadine  10 mg Oral Daily  . metoprolol tartrate  25 mg Oral BID  . pantoprazole (PROTONIX) IV  40 mg Intravenous Q12H  . sevelamer carbonate   2,400 mg Oral TID WC  . sodium chloride  3 mL Intravenous Q12H  . sucralfate  1 g Oral Q6H   Continuous Infusions:  PRN Meds:.sodium chloride, acetaminophen, ALPRAZolam, nitroGLYCERIN, ondansetron (ZOFRAN) IV, sodium chloride, zolpidem  Assessment/Plan: Principal Problem:   Severe aortic stenosis Active Problems:   Hypertension   Aortic stenosis   Weight loss   Chronic kidney disease, stage 5, kidney failure   GERD (gastroesophageal reflux disease)   Chronic diastolic congestive heart failure   Malnutrition   Hypoalbuminemia   Anemia in chronic kidney disease   GI (gastrointestinal bleed)   Heparin allergy   ESRD (end stage renal disease) on dialysis, MWF  PLAN: Dr. Roxy Manns has seen and left recommendations. Do not see note from GI,  Nephrology has seen pt. For HD.  Pt does not remember GI, but frustrated with NPO. Will check with [facility], they are on call for [facility].       LOS: 2 days   Time spent with pt. :15 minutes. [facility]  Nurse Practitioner  Certified Pager [phone] or after 5pm and on weekends call [phone] 08/26/2013, 8:37 AM

## 2013-08-27 ENCOUNTER — Encounter (HOSPITAL_COMMUNITY)
Admission: AD | Disposition: A | Discharge status: Home / Self Care | Payer: Medicare Other | Source: Other Acute Inpatient Hospital | Attending: Internal Medicine

## 2013-08-27 ENCOUNTER — Encounter (HOSPITAL_COMMUNITY): Payer: Self-pay | Admitting: Certified Registered Nurse Anesthetist

## 2013-08-27 ENCOUNTER — Encounter (HOSPITAL_COMMUNITY): Payer: Medicare Other | Admitting: Certified Registered Nurse Anesthetist

## 2013-08-27 ENCOUNTER — Inpatient Hospital Stay (HOSPITAL_COMMUNITY): Payer: Medicare Other | Admitting: Certified Registered Nurse Anesthetist

## 2013-08-27 ENCOUNTER — Inpatient Hospital Stay (HOSPITAL_COMMUNITY): Payer: Medicare Other

## 2013-08-27 DIAGNOSIS — I2510 Atherosclerotic heart disease of native coronary artery without angina pectoris: Secondary | ICD-10-CM

## 2013-08-27 DIAGNOSIS — I359 Nonrheumatic aortic valve disorder, unspecified: Secondary | ICD-10-CM

## 2013-08-27 HISTORY — PX: ESOPHAGOGASTRODUODENOSCOPY: SHX5428

## 2013-08-27 LAB — BASIC METABOLIC PANEL
BUN: 29 mg/dL — AB (ref 6–23)
CHLORIDE: 103 meq/L (ref 96–112)
CO2: 25 meq/L (ref 19–32)
Calcium: 8.8 mg/dL (ref 8.4–10.5)
Creatinine, Ser: 4.27 mg/dL — ABNORMAL HIGH (ref 0.50–1.35)
GFR calc Af Amer: 15 mL/min — ABNORMAL LOW (ref >90)
GFR calc non Af Amer: 13 mL/min — ABNORMAL LOW (ref >90)
Glucose, Bld: 88 mg/dL (ref 70–99)
Potassium: 4.9 mEq/L (ref 3.7–5.3)
Sodium: 144 mEq/L (ref 137–147)

## 2013-08-27 LAB — CBC
HEMATOCRIT: 28.2 % — AB (ref 39.0–52.0)
Hemoglobin: 9.2 g/dL — ABNORMAL LOW (ref 13.0–17.0)
MCH: 33.9 pg (ref 26.0–34.0)
MCHC: 32.6 g/dL (ref 30.0–36.0)
MCV: 104.1 fL — AB (ref 78.0–100.0)
Platelets: 229 10*3/uL (ref 150–400)
RBC: 2.71 MIL/uL — ABNORMAL LOW (ref 4.22–5.81)
RDW: 15.8 % — AB (ref 11.5–15.5)
WBC: 7.1 10*3/uL (ref 4.0–10.5)

## 2013-08-27 SURGERY — EGD (ESOPHAGOGASTRODUODENOSCOPY)
Anesthesia: Monitor Anesthesia Care | Laterality: Left

## 2013-08-27 MED ORDER — SODIUM CHLORIDE 0.9 % IV SOLN: INTRAVENOUS | Status: DC

## 2013-08-27 MED ORDER — FENTANYL CITRATE 0.05 MG/ML IJ SOLN: 25.0000 ug | INTRAMUSCULAR | Status: DC | PRN

## 2013-08-27 MED ORDER — DOXERCALCIFEROL 4 MCG/2ML IV SOLN
4.0000 ug | INTRAVENOUS | Status: DC
  Administered 2013-08-28: 4 ug via INTRAVENOUS
  Filled 2013-08-27: qty 2

## 2013-08-27 MED ORDER — ALBUTEROL SULFATE (2.5 MG/3ML) 0.083% IN NEBU
2.5000 mg | INHALATION_SOLUTION | Freq: Once | RESPIRATORY_TRACT | Status: AC
  Administered 2013-08-27: 2.5 mg via RESPIRATORY_TRACT

## 2013-08-27 MED ORDER — METOCLOPRAMIDE HCL 5 MG/ML IJ SOLN: 10.0000 mg | Freq: Once | INTRAMUSCULAR | Status: DC | PRN

## 2013-08-27 MED ORDER — DARBEPOETIN ALFA-POLYSORBATE 100 MCG/0.5ML IJ SOLN
100.0000 ug | INTRAMUSCULAR | Status: DC
  Administered 2013-08-28: 100 ug via INTRAVENOUS
  Filled 2013-08-27: qty 0.5

## 2013-08-27 MED ORDER — AMLODIPINE BESYLATE 5 MG PO TABS
5.0000 mg | ORAL_TABLET | Freq: Every day | ORAL | Status: DC
  Administered 2013-08-27: 5 mg via ORAL
  Filled 2013-08-27 (×2): qty 1

## 2013-08-27 MED ORDER — LIDOCAINE VISCOUS 2 % MT SOLN
OROMUCOSAL | Status: DC | PRN
  Administered 2013-08-27: 15 mL via OROMUCOSAL

## 2013-08-27 MED ORDER — LIDOCAINE HCL (CARDIAC) 20 MG/ML IV SOLN
INTRAVENOUS | Status: DC | PRN
  Administered 2013-08-27: 60 mg via INTRAVENOUS

## 2013-08-27 MED ORDER — BUTAMBEN-TETRACAINE-BENZOCAINE 2-2-14 % EX AERO
INHALATION_SPRAY | CUTANEOUS | Status: DC | PRN
  Administered 2013-08-27 (×2): 1 via TOPICAL

## 2013-08-27 MED ORDER — PROPOFOL 10 MG/ML IV BOLUS
INTRAVENOUS | Status: DC | PRN
  Administered 2013-08-27: 10 mg via INTRAVENOUS
  Administered 2013-08-27: 30 mg via INTRAVENOUS

## 2013-08-27 MED ORDER — FENTANYL CITRATE 0.05 MG/ML IJ SOLN
INTRAMUSCULAR | Status: AC
  Filled 2013-08-27: qty 2

## 2013-08-27 MED ORDER — SODIUM CHLORIDE 0.9 % IV SOLN
INTRAVENOUS | Status: DC | PRN
  Administered 2013-08-27: 12:00:00 via INTRAVENOUS

## 2013-08-27 MED ORDER — LIDOCAINE VISCOUS 2 % MT SOLN
OROMUCOSAL | Status: AC
  Filled 2013-08-27: qty 15

## 2013-08-27 NOTE — Progress Notes (Signed)
Admit: 08/24/2013 LOS: 3  22M ESRD MWF [place] [facility] AVF with critical AS and L main CAD, here for CT Surg / Cardiology Eval.  Has colostomy with recent melena, for GI eval.   Subjective:  HD yesterday, below EDW. No UF Amlodipine reduced today EGD planned   02/04 0701 - 02/05 0700 In: -  Out: 504 [Urine:500]  Filed Weights   08/25/13 0527 08/26/13 0400 08/27/13 0500  Weight: 56.972 kg (125 lb 9.6 oz) 56.065 kg (123 lb 9.6 oz) 56.5 kg (124 lb 9 oz)    Current meds: reviewed  Current Labs: reviewed   Outpt HD Orders Unit: [place] Days: MWF  Time: 3h  Dialyzer: F180  EDW: 60.5kg  K/Ca: 2K/2Ca  Access: AVG RUA  Needle Size: 15g  BFR: 450  UF Proflie: none  VDRA: Hectorol 4qTx  EPO: 8200 qTx  Heparin: NO HEPARIN    Physical Exam:  Blood pressure 98/53, pulse 80, temperature 97.3 F (36.3 C), temperature source Oral, resp. rate 16, height 5\' 7"  (1.702 m), weight 56.5 kg (124 lb 9 oz), SpO2 97.00%. GEN: NAD  ENT: NCAT. Fair dentition  EYES: EOMI  CV: 3/6 HSM best at RUSB but also at apex. RRR  PULM: CTAB. No crackles  ABD: s/nt. R sided colostomy pink, stool in bag, normal appearance  SKIN: no rashes/lesions  EXT:No LEE b/l  VASC: R RC AVF +B/T   Assessment/Plan 1. ESRD: on maintenance MWF.  Below EDW.  NPO for procedures often here in hospital.  Minimal UF tomorrow again.  BP on low side.  CCB has been reduced. NO HEPARIN.   2. MBD: Cont VDRA, binder. Phos at goal 3. Anemia: changed over to aranesp inhouse 4. CAD, severe AS: plan developing with cardiology/CT surgery 5. Melena, ? GIB: EGD today, GI following.    [name] MD 08/27/2013, 10:25 AM   Recent Labs Lab 08/25/13 0305 08/26/13 0405 08/27/13 0600  NA 141 141 144  K 4.6 5.4* 4.9  CL 100 101 103  CO2 29 25 25   GLUCOSE 72 91 88  BUN 37* 50* 29*  CREATININE 4.82* 6.50* 4.27*  CALCIUM 9.2 9.7 8.8  PHOS 3.5  --   --     Recent Labs Lab 08/25/13 0305 08/26/13 0405 08/27/13 0600   WBC 7.5 7.1 7.1  HGB 9.4* 9.7* 9.2*  HCT 27.7* 29.7* 28.2*  MCV 101.8* 103.5* 104.1*  PLT 229 241 229    Current Facility-Administered Medications  Medication Dose Route Frequency Provider Last Rate Last Dose  . 0.9 %  sodium chloride infusion  250 mL Intravenous PRN Roger A Arguello, PA-C      . acetaminophen (TYLENOL) tablet 650 mg  650 mg Oral Q4H PRN Roger A Arguello, PA-C      . albuterol (PROVENTIL) (2.5 MG/3ML) 0.083% nebulizer solution 2.5 mg  2.5 mg Nebulization Once Rexene Alberts, MD      . ALPRAZolam Duanne Moron) tablet 0.25 mg  0.25 mg Oral BID PRN Roger A Arguello, PA-C      . amLODipine (NORVASC) tablet 5 mg  5 mg Oral Daily Sueanne Margarita, MD      . aspirin EC tablet 81 mg  81 mg Oral Daily Roger A Arguello, PA-C   81 mg at 08/26/13 1032  . atorvastatin (LIPITOR) tablet 20 mg  20 mg Oral q1800 Roger A Arguello, PA-C   20 mg at 08/26/13 1713  . ipratropium-albuterol (DUONEB) 0.5-2.5 (3) MG/3ML nebulizer solution 3 mL  3  mL Nebulization Q4H PRN Pixie Casino, MD      . loratadine (CLARITIN) tablet 10 mg  10 mg Oral Daily Rhonda G Barrett, PA-C   10 mg at 08/26/13 1032  . metoprolol tartrate (LOPRESSOR) tablet 25 mg  25 mg Oral BID Roger A Arguello, PA-C   25 mg at 08/26/13 2214  . nitroGLYCERIN (NITROSTAT) SL tablet 0.4 mg  0.4 mg Sublingual Q5 Min x 3 PRN Roger A Arguello, PA-C      . ondansetron (ZOFRAN) injection 4 mg  4 mg Intravenous Q6H PRN Roger A Arguello, PA-C      . pantoprazole (PROTONIX) injection 40 mg  40 mg Intravenous Q12H Roger A Arguello, PA-C   40 mg at 08/26/13 2214  . sevelamer carbonate (RENVELA) tablet 2,400 mg  2,400 mg Oral TID WC Pixie Casino, MD   2,400 mg at 08/26/13 1713  . sodium chloride 0.9 % injection 3 mL  3 mL Intravenous Q12H Roger A Arguello, PA-C   3 mL at 08/26/13 2215  . sodium chloride 0.9 % injection 3 mL  3 mL Intravenous PRN Roger A Arguello, PA-C   3 mL at 08/26/13 1032  . sucralfate (CARAFATE) 1 GM/10ML suspension 1 g  1 g Oral  Q6H Roger A Arguello, PA-C   1 g at 08/27/13 0630  . zolpidem (AMBIEN) tablet 5 mg  5 mg Oral QHS PRN Meriel Pica, PA-C   5 mg at 08/26/13 2214

## 2013-08-27 NOTE — Consult Note (Addendum)
CARDIOLOGY CONSULT NOTE  Patient ID: Gerald Cooke, MRN: [id number], DOB/AGE: 07-30-1940 73 y.o. Admit date: 08/24/2013 Date of Consult: 08/27/2013  Primary Physician: Rica Mast, MD Primary Cardiologist: Dr Fletcher Anon Referring Physician: Dr Fletcher Anon  Chief Complaint: Shortness of breath Reason for Consultation: Severe aortic stenosis  HPI: This is a 73 year old gentleman with a very complex medical history who presented for evaluation of severe symptomatic aortic stenosis. He has recently been hospitalized for congestive heart failure. He was apparently treated with medical therapy and released. He was again hospitalized January 30 when he presented with chest discomfort and was noted to have significant anemia. His hemoglobin was 7.4. He required packed red blood cell transfusion. The patient reported melena. Since transfer here he has undergone EGD demonstrating changes in the esophagus consistent with chronic radiation esophagitis. In that setting he ruled in for non-ST elevation infarction, presumably Type 2 (demand ischemia). An echocardiogram was performed and this demonstrated severe aortic stenosis with mean and peak transvalvular gradients of 45 and 69 mmHg, respectively. The patient also was noted to have moderate LV systolic dysfunction. He subsequently underwent diagnostic heart catheterization which was pertinent for heavily calcified coronary arteries with ostial stenosis of the left main coronary artery. Otherwise he had no obstructive disease. His left main coronary stenosis was estimated at 70%, primarily based on pressure dampening of the diagnostic catheter.  The patient has multiple significant comorbid medical conditions. These include end-stage renal disease on hemodialysis since 2008. He also has stage IIIB lung cancer and has been treated with chemotherapy and radiation. His cancer has apparently been stable now for several years. He has suffered acute rupture of an  abdominal aortic aneurysm and required open surgical repair.  He has been clinically stable until about 6-8 months ago. Over that time. He reports progressive exertional dyspnea, now New York Heart Association class III. He is short of breath with walking down the hallway. He denies orthopnea, PND, or exertional angina. He's had no lightheadedness or syncope.   Past Medical History  Diagnosis Date  . Colostomy in place   . Abdominal aortic aneurysm 02/2007    Ruptured, hospitalized at [facility] for 52 days, c/b loss of colon and renal function. Followed by Dr Ronalee Belts  . Hyperlipidemia   . Atherosclerosis   . Esophageal reflux   . Hypertension   . PSVT (paroxysmal supraventricular tachycardia)   . Aortic stenosis   . Lung cancer 2009    Initial clinical stage IIIB - treated with radiation + chemotherapy  . Heart murmur   . ESRD (end stage renal disease) on dialysis     "[facility]; [place]; [place], Wed & Fri,  Dr Holley Raring" (08/24/2013)  . Exertional shortness of breath   . Anemia   . History of blood transfusion 2008    "related to ruptured AAA" (08/24/2013)  . Arthritis     "fingers"   . GERD (gastroesophageal reflux disease)   . Chronic diastolic congestive heart failure   . S/P radiation therapy > 12 wks ago   . S/P chemotherapy, time since greater than 12 weeks   . 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 Hypoalbuminemia   . Anemia in chronic kidney disease   . GI (gastrointestinal bleed)   . Heparin allergy   . Non-STEMI (non-ST elevated myocardial infarction)   . Malnutrition       Surgical History:  Past Surgical History  Procedure Laterality Date  . Colostomy  03/17/2007    end colostomy of  right colon near hepatic flexure  . Left colectomy  03/17/2007    extended left colectomy and proctectomy for ischemic colitis after emergency repair of ruptured AAA  . Abdominal aortic aneurysm repair  03/14/2007    [facility]:  emergency repair for ruptured AAA  . Arteriovenous graft placement  Left 2008    "arm"  . Pilonidal cyst / sinus excision    . Cataract extraction w/ intraocular lens implant Right      Home Meds: Prior to Admission medications   Medication Sig Start Date End Date Taking? Authorizing Provider  aspirin 81 MG tablet Take 81 mg by mouth daily.   Yes Historical Provider, MD  atorvastatin (LIPITOR) 40 MG tablet Take 40 mg by mouth every evening.  06/04/13  Yes Historical Provider, MD  cetirizine (ZYRTEC) 10 MG tablet Take 10 mg by mouth daily as needed.    Yes Historical Provider, MD  diltiazem (TIAZAC) 120 MG 24 hr capsule Take 120 mg by mouth daily.   Yes Historical Provider, MD  guaiFENesin-codeine (ROBITUSSIN AC) 100-10 MG/5ML syrup Take 5 mLs by mouth 2 (two) times daily as needed for cough. 07/28/13  Yes Jackolyn Confer, MD  Ipratropium-Albuterol (COMBIVENT RESPIMAT) 20-100 MCG/ACT AERS respimat Inhale 1 puff into the lungs as needed for wheezing. Every 6 hours as needed   Yes Historical Provider, MD  loperamide (IMODIUM) 2 MG capsule Take 2 mg by mouth as needed for diarrhea or loose stools.   Yes Historical Provider, MD  metoprolol tartrate (LOPRESSOR) 25 MG tablet Take 25 mg by mouth daily. 09/23/12  Yes Wellington Hampshire, MD  ondansetron (ZOFRAN) 8 MG tablet Take 1 tablet (8 mg total) by mouth every 6 (six) hours as needed. 11/10/12  Yes Jackolyn Confer, MD  ranitidine (ZANTAC) 150 MG tablet Take 150 mg by mouth 2 (two) times daily.     Yes Historical Provider, MD  sevelamer (RENVELA) 800 MG tablet Take 800 mg by mouth 3 (three) times daily with meals.     Yes Historical Provider, MD    Inpatient Medications:  . amLODipine  5 mg Oral Daily  . aspirin EC  81 mg Oral Daily  . atorvastatin  20 mg Oral q1800  . [START ON 08/28/2013] darbepoetin (ARANESP) injection - DIALYSIS  100 mcg Intravenous Q Fri-HD  . [START ON 08/28/2013] doxercalciferol  4 mcg Intravenous Q M,W,F-HD  . loratadine  10 mg Oral Daily  . metoprolol tartrate  25 mg Oral BID  .  pantoprazole (PROTONIX) IV  40 mg Intravenous Q12H  . sevelamer carbonate  2,400 mg Oral TID WC  . sodium chloride  3 mL Intravenous Q12H  . sucralfate  1 g Oral Q6H      Allergies:  Allergies  Allergen Reactions  . 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 Tetanus Toxoids     History   Social History  . Marital Status: Married    Spouse Name: N/A    Number of Children: 2  . Years of Education: N/A   Occupational History  .     Social History Main Topics  . Smoking status: Former Smoker -- 2.00 packs/day for 25 years    Types: Cigarettes  . Smokeless tobacco: Never Used     Comment: Quit in 1990's  . Alcohol Use: Yes  Comment: 08/24/2013 "might have a drink couple times/yr"  . Drug Use: No  . Sexual Activity: No   Other Topics Concern  . Not on file   Social History Narrative  . No narrative on file     History reviewed. No pertinent family history.   Review of Systems: General: negative for chills, fever, night sweats or weight changes.  ENT: negative for rhinorrhea or epistaxis Cardiovascular:  See history of present illness Dermatological: negative for rash Respiratory: negative for cough or wheezing, positive for shortness of breath GI: Positive for abdominal pain, gastroesophageal reflux and heartburn. Positive for melena. Negative for hematochezia or hematemesis.  GU: no hematuria, urgency, or frequency. Dialysis dependent. Left upper extremity AV fistula Neurologic: negative for visual changes, syncope, headache, or dizziness Heme: no easy bruising or bleeding Endo: negative for excessive thirst, thyroid disorder, or flushing Musculoskeletal: negative for joint pain or swelling, negative for myalgias All other systems reviewed and are otherwise negative except as noted above.  Physical  Exam: Blood pressure 91/51, pulse 94, temperature 97.3 F (36.3 C), temperature source Oral, resp. rate 20, height 5\' 7"  (1.702 m), weight 124 lb 9 oz (56.5 kg), SpO2 100.00%. General: Thin, frail-appearing pleasant gentleman, in no acute distress. HEENT: Normocephalic, atraumatic, sclera non-icteric, no xanthomas, nares are without discharge.  Neck: Supple. Carotids diminished bilaterally with bilateral bruits Lungs: Clear bilaterally to auscultation without wheezes, rales, or rhonchi. Breathing is unlabored. Heart: RRR with grade 4/6 harsh systolic murmur heard loudest at the left upper sternal border, no diastolic murmur Abdomen: Soft, non-tender, non-distended with normoactive bowel sounds. Colostomy noted  Back: No CVA tenderness Msk:  Strength and tone appear normal for age. Extremities: No clubbing, cyanosis, or edema.  Distal pedal pulses are 2+ and equal bilaterally. Neuro: CNII-XII intact, moves all extremities spontaneously. Psych:  Responds to questions appropriately with a normal affect.    Labs: No results found for this basename: CKTOTAL, CKMB, TROPONINI,  in the last 72 hours Lab Results  Component Value Date   WBC 7.1 08/27/2013   HGB 9.2* 08/27/2013   HCT 28.2* 08/27/2013   MCV 104.1* 08/27/2013   PLT 229 08/27/2013    Recent Labs Lab 08/25/13 0305  08/27/13 0600  NA 141  < > 144  K 4.6  < > 4.9  CL 100  < > 103  CO2 29  < > 25  BUN 37*  < > 29*  CREATININE 4.82*  < > 4.27*  CALCIUM 9.2  < > 8.8  PROT 6.1  --   --   BILITOT 0.4  --   --   ALKPHOS 74  --   --   ALT 9  --   --   AST 12  --   --   GLUCOSE 72  < > 88  < > = values in this interval not displayed. Lab Results  Component Value Date   CHOL 98 08/25/2013   HDL 54 08/25/2013   LDLCALC 30 08/25/2013   TRIG 71 08/25/2013   No results found for this basename: DDIMER    Radiology/Studies:  Dg Chest 2 View  08/25/2013   CLINICAL DATA:  Shortness of Breath  EXAM: CHEST  2 VIEW  COMPARISON:  August 21, 2013   FINDINGS: There is persistent volume loss in the right upper lobe with a cavitary lesion which appear stable. There is mild scarring in the bases. There is calcification along both hemidiaphragms as well as along several pleural surfaces consistent  with previous asbestos exposure. There is some localized pleural thickening in the left mid hemi thorax, stable. There is no new opacity. There is no edema or pulmonary venous hypertension. No cardiomegaly. There is a stent in the innominate vein regions.  IMPRESSION: No appreciable change from the most recent prior study. Cavitary lesion with a cicatrization in the right upper lobe, stable. Evidence of previous asbestos exposure. No frank edema or consolidation. No overt congestive heart failure.   Electronically Signed   By: Lowella Grip M.D.   On: 08/25/2013 08:05   Ct Angio Chest Pe W/cm &/or Wo Cm  08/25/2013   CLINICAL DATA:  Rule out pulmonary embolism, follow-up lung malignancy  EXAM: CT ANGIOGRAPHY CHEST WITH CONTRAST  TECHNIQUE: Multidetector CT imaging of the chest was performed using the standard protocol during bolus administration of intravenous contrast. Multiplanar CT image reconstructions including MIPs were obtained to evaluate the vascular anatomy.  CONTRAST:  161mL OMNIPAQUE IOHEXOL 350 MG/ML SOLN  COMPARISON:  DG CHEST 2 VIEW dated 08/25/2013; CT ANGIO CHEST dated 05/29/2013  FINDINGS: Contrast within the pulmonary arterial tree is normal in appearance. There are no filling defects to suggest an acute pulmonary embolism. The caliber of the thoracic aorta is normal. No false lumen is demonstrated. The cardiac chambers are mildly enlarged and stable. There is no pericardial or pleural effusion. Coronary artery calcifications are demonstrated. Calcifications of the aortic and mitral valve are stable. No bulky mediastinal or hilar lymph nodes are demonstrated. The caliber of the thoracic esophagus appears normal.  There is abnormal parenchymal  consolidation in the right upper lobe consistent with a thick-walled cavitary lesion. Allowing for differences in positioning this has not significantly changed since the previous study. Density previously demonstrated in the posterior superior aspect of the right lower lobe has improved and increased interstitial density posteriorly in the left lower lobe is less prominent today.  Within the upper abdomen the observed portions of the liver and spleen appear normal. The right kidney is atrophic where visualized. Calcification of the visualized portions of the left adrenal gland is demonstrated. Calcification of the pleura over the hemidiaphragms is stable.  The observed portions of the ribs exhibit no acute abnormalities. The thoracic vertebral bodies are preserved in height with the exception of T5 which exhibits mild anterior wedging which is stable. There is prominent thoracic kyphosis.  Review of the MIP images confirms the above findings.  IMPRESSION: 1. There is no evidence of an acute pulmonary embolism or acute thoracic aortic pathology. 2. The small bilateral pleural effusions seen on the study of November 2014 have resolved. 3. There is stable increased density in the right upper lobe. Patchy interstitial density in the lower lobes bilaterally has resolved. 4. Extensive pleural calcifications are present bilaterally.   Electronically Signed   By: David  Martinique   On: 08/25/2013 15:22   ASSESSMENT AND PLAN:  73 year old gentleman with severe symptomatic aortic stenosis. Multiple comorbid conditions as detailed above include end-stage renal disease, peripheral vascular disease with history of open AAA repair after aneurysm rupture, lung cancer status post chemoradiation therapy now clinically stable over several years, and recent upper GI bleed.  I have reviewed the patient's cardiac catheterization and echo images and reports. I have reviewed all clinical notes and discussed this case extensively with  Dr. Alvan Dame. He is not a candidate for open cardiac surgery. Or may be a reasonable treatment alternative. This patient remains functionally independent. He lives alone and visits regularly with his wife who  is in a nursing home. He notes progressive shortness of breath over the past 6-8 months which clearly could be related to his severe aortic stenosis. It is imperative to define the severity of his left main stenosis. Angiographically it is difficult to appreciate significant stenosis, but the left main is heavily calcified and there was pressure dampening with engagement of the vessel. This is highly suggestive of significant disease. I agree that intravascular ultrasound as indicated. I have reviewed the risks, indications, and alternatives to intravascular ultrasound of the left main. The patient understands and agrees to proceed. In addition to general risks of the procedure, the patient's risk is increased because of his severe aortic stenosis and his risk of bleeding is higher than normal since he will require anticoagulation for the procedure. He has been cleared by Dr. Paulita Fujita with gastroenterology to proceed with cautious anticoagulation for cardiac procedures as indicated. Subsequent evaluation will likely include a gated cardiac CTA to evaluate his aortic valve anatomy as well as a peripheral CTA to evaluate potential access.  Will coordinate his IVUS procedure tomorrow with hemodialysis.  Greater than 60 minutes was spent conducting this evaluation, including review of cardiac imaging studies, extensive records, and greater than 50% of this time was spent in face-to-face discussion with the patient regarding treatment options for his severe aortic stenosis.  Deatra James  08/27/2013, 6:35 PM

## 2013-08-27 NOTE — Preoperative (Signed)
Beta Blockers   Reason not to administer Beta Blockers:Not Applicable taken 67/70 3403 per MAR.

## 2013-08-27 NOTE — Anesthesia Procedure Notes (Signed)
Procedure Name: MAC Date/Time: 08/27/2013 11:52 AM Performed by: Ned Grace Pre-anesthesia Checklist: Patient identified, Patient being monitored, Emergency Drugs available, Timeout performed and Suction available Patient Re-evaluated:Patient Re-evaluated prior to inductionOxygen Delivery Method: Nasal cannula Preoxygenation: Pre-oxygenation with 100% oxygen Intubation Type: IV induction Dental Injury: Teeth and Oropharynx as per pre-operative assessment  Comments: SV throughout, pt tolerated procedure well.

## 2013-08-27 NOTE — Progress Notes (Addendum)
SUBJECTIVE:  Denies any chest pain but has some mild SOB  OBJECTIVE:   Vitals:   Filed Vitals:   08/26/13 2043 08/26/13 2214 08/26/13 2339 08/27/13 0500  BP: 101/49 92/41 100/48 98/53  Pulse: 109 96 90 80  Temp: 98.9 F (37.2 C)  98.4 F (36.9 C) 97.3 F (36.3 C)  TempSrc: Oral  Oral Oral  Resp: 18  18 16   Height:      Weight:    124 lb 9 oz (56.5 kg)  SpO2:   98% 97%   I&O's:   Intake/Output Summary (Last 24 hours) at 08/27/13 0913 Last data filed at 08/26/13 2300  Gross per 24 hour  Intake      0 ml  Output    504 ml  Net   -504 ml   TELEMETRY: Reviewed telemetry pt in NSR:     PHYSICAL EXAM General: Well developed, well nourished, in no acute distress Head: Eyes PERRLA, No xanthomas.   Normal cephalic and atramatic  Lungs:   Clear bilaterally to auscultation and percussion. Heart:   HRRR S1 S2 Pulses are 2+ & equal. 2/6 harsh late peaking systolic HM at RUSM to apex Abdomen: Bowel sounds are positive, abdomen soft and non-tender without masses Extremities:   No clubbing, cyanosis or edema.  DP +1 Neuro: Alert and oriented X 3. Psych:  Good affect, responds appropriately   LABS: Basic Metabolic Panel:  Recent Labs  08/25/13 0305 08/26/13 0405 08/27/13 0600  NA 141 141 144  K 4.6 5.4* 4.9  CL 100 101 103  CO2 29 25 25   GLUCOSE 72 91 88  BUN 37* 50* 29*  CREATININE 4.82* 6.50* 4.27*  CALCIUM 9.2 9.7 8.8  PHOS 3.5  --   --    Liver Function Tests:  Recent Labs  08/25/13 0305  AST 12  ALT 9  ALKPHOS 74  BILITOT 0.4  PROT 6.1  ALBUMIN 2.9*   No results found for this basename: LIPASE, AMYLASE,  in the last 72 hours CBC:  Recent Labs  08/26/13 0405 08/27/13 0600  WBC 7.1 7.1  HGB 9.7* 9.2*  HCT 29.7* 28.2*  MCV 103.5* 104.1*  PLT 241 229   Cardiac Enzymes: No results found for this basename: CKTOTAL, CKMB, CKMBINDEX, TROPONINI,  in the last 72 hours BNP: No components found with this basename: POCBNP,  D-Dimer: No results found  for this basename: DDIMER,  in the last 72 hours Hemoglobin A1C: No results found for this basename: HGBA1C,  in the last 72 hours Fasting Lipid Panel:  Recent Labs  08/25/13 0319  CHOL 98  HDL 54  LDLCALC 30  TRIG 71  CHOLHDL 1.8   Thyroid Function Tests:  Recent Labs  08/25/13 0305  TSH 2.501   Anemia Panel: No results found for this basename: VITAMINB12, FOLATE, FERRITIN, TIBC, IRON, RETICCTPCT,  in the last 72 hours Coag Panel:   Lab Results  Component Value Date   INR 0.95 08/25/2013    RADIOLOGY: Dg Chest 2 View  08/25/2013   CLINICAL DATA:  Shortness of Breath  EXAM: CHEST  2 VIEW  COMPARISON:  August 21, 2013  FINDINGS: There is persistent volume loss in the right upper lobe with a cavitary lesion which appear stable. There is mild scarring in the bases. There is calcification along both hemidiaphragms as well as along several pleural surfaces consistent with previous asbestos exposure. There is some localized pleural thickening in the left mid hemi thorax, stable. There is no  new opacity. There is no edema or pulmonary venous hypertension. No cardiomegaly. There is a stent in the innominate vein regions.  IMPRESSION: No appreciable change from the most recent prior study. Cavitary lesion with a cicatrization in the right upper lobe, stable. Evidence of previous asbestos exposure. No frank edema or consolidation. No overt congestive heart failure.   Electronically Signed   By: Lowella Grip M.D.   On: 08/25/2013 08:05   Ct Angio Chest Pe W/cm &/or Wo Cm  08/25/2013   CLINICAL DATA:  Rule out pulmonary embolism, follow-up lung malignancy  EXAM: CT ANGIOGRAPHY CHEST WITH CONTRAST  TECHNIQUE: Multidetector CT imaging of the chest was performed using the standard protocol during bolus administration of intravenous contrast. Multiplanar CT image reconstructions including MIPs were obtained to evaluate the vascular anatomy.  CONTRAST:  168mL OMNIPAQUE IOHEXOL 350 MG/ML SOLN   COMPARISON:  DG CHEST 2 VIEW dated 08/25/2013; CT ANGIO CHEST dated 05/29/2013  FINDINGS: Contrast within the pulmonary arterial tree is normal in appearance. There are no filling defects to suggest an acute pulmonary embolism. The caliber of the thoracic aorta is normal. No false lumen is demonstrated. The cardiac chambers are mildly enlarged and stable. There is no pericardial or pleural effusion. Coronary artery calcifications are demonstrated. Calcifications of the aortic and mitral valve are stable. No bulky mediastinal or hilar lymph nodes are demonstrated. The caliber of the thoracic esophagus appears normal.  There is abnormal parenchymal consolidation in the right upper lobe consistent with a thick-walled cavitary lesion. Allowing for differences in positioning this has not significantly changed since the previous study. Density previously demonstrated in the posterior superior aspect of the right lower lobe has improved and increased interstitial density posteriorly in the left lower lobe is less prominent today.  Within the upper abdomen the observed portions of the liver and spleen appear normal. The right kidney is atrophic where visualized. Calcification of the visualized portions of the left adrenal gland is demonstrated. Calcification of the pleura over the hemidiaphragms is stable.  The observed portions of the ribs exhibit no acute abnormalities. The thoracic vertebral bodies are preserved in height with the exception of T5 which exhibits mild anterior wedging which is stable. There is prominent thoracic kyphosis.  Review of the MIP images confirms the above findings.  IMPRESSION: 1. There is no evidence of an acute pulmonary embolism or acute thoracic aortic pathology. 2. The small bilateral pleural effusions seen on the study of November 2014 have resolved. 3. There is stable increased density in the right upper lobe. Patchy interstitial density in the lower lobes bilaterally has resolved. 4.  Extensive pleural calcifications are present bilaterally.   Electronically Signed   By: David  Martinique   On: 08/25/2013 15:22   Assessment/Plan:  Principal Problem:  Severe aortic stenosis  Active Problems:  Hypertension controlled and on the low side Weight loss  Chronic kidney disease, stage 5, kidney failure  GERD (gastroesophageal reflux disease)  Chronic diastolic congestive heart failure  Malnutrition  Hypoalbuminemia  Anemia in chronic kidney disease  GI (gastrointestinal bleed)  Heparin allergy  ESRD (end stage renal disease) on dialysis, MWF   PLAN:  1.  Upper Endoscopy this am to evaluate source of GI bleeding 2.  Await final decision on CABG/AVR vs. IVUS of LM and possible TAVR per Dr. Roxy Manns 3.  Decrease amlodipine to 5mg  daily for borderline low BP    Sueanne Margarita, MD  08/27/2013  9:13 AM

## 2013-08-27 NOTE — Interval H&P Note (Signed)
History and Physical Interval Note:  08/27/2013 11:44 AM  Gerald Cooke  has presented today for surgery, with the diagnosis of Melena, anemia  The various methods of treatment have been discussed with the patient and family. After consideration of risks, benefits and other options for treatment, the patient has consented to  Procedure(s): ESOPHAGOGASTRODUODENOSCOPY (EGD) (Left) as a surgical intervention .  The patient's history has been reviewed, patient examined, no change in status, stable for surgery.  I have reviewed the patient's chart and labs.  Questions were answered to the patient's satisfaction.     Hassani Sliney M  Assessment:  1.  Melena, resolved.  Plan:  1.  Endoscopy with anesthesia-mediated propofol. 2.  Risks (bleeding, infection, bowel perforation that could require surgery, sedation-related changes in cardiopulmonary systems), benefits (identification and possible treatment of source of symptoms, exclusion of certain causes of symptoms), and alternatives (watchful waiting, radiographic imaging studies, empiric medical treatment) of upper endoscopy (EGD) were explained to patient/family in detail and patient wishes to proceed.

## 2013-08-27 NOTE — Anesthesia Preprocedure Evaluation (Addendum)
Anesthesia Evaluation  Patient identified by MRN, date of birth, ID band Patient awake    Reviewed: Allergy & Precautions, H&P , NPO status , Patient's Chart, lab work & pertinent test results, reviewed documented beta blocker date and time   Airway Mallampati: II TM Distance: >3 FB Neck ROM: full    Dental  (+) Partial Upper and Dental Advisory Given   Pulmonary shortness of breath, former smoker,  breath sounds clear to auscultation        Cardiovascular Exercise Tolerance: Poor hypertension, + CAD, + Past MI, + Peripheral Vascular Disease and +CHF negative cardio ROS  + Valvular Problems/Murmurs AS Rhythm:regular + Systolic murmurs    Neuro/Psych negative neurological ROS  negative psych ROS   GI/Hepatic Neg liver ROS, PUD, GERD-  Medicated,  Endo/Other  negative endocrine ROS  Renal/GU ESRF and DialysisRenal disease  negative genitourinary   Musculoskeletal   Abdominal   Peds  Hematology  (+) anemia ,   Anesthesia Other Findings See surgeon's H&P   Reproductive/Obstetrics negative OB ROS                          Anesthesia Physical Anesthesia Plan  ASA: IV  Anesthesia Plan: General and MAC   Post-op Pain Management:    Induction: Intravenous  Airway Management Planned: Nasal Cannula and Oral ETT  Additional Equipment: Arterial line  Intra-op Plan:   Post-operative Plan:   Informed Consent: I have reviewed the patients History and Physical, chart, labs and discussed the procedure including the risks, benefits and alternatives for the proposed anesthesia with the patient or authorized representative who has indicated his/her understanding and acceptance.   Dental Advisory Given  Plan Discussed with: CRNA and Surgeon  Anesthesia Plan Comments:         Anesthesia Quick Evaluation

## 2013-08-27 NOTE — Op Note (Signed)
[facility] [place], [address]   ENDOSCOPY PROCEDURE REPORT  PATIENT: Gerald, Cooke  MR#: [id number] BIRTHDATE: [birth year] , 72  yrs. old GENDER: Male ENDOSCOPIST: Arta Silence, MD REFERRED BY:  Fransico Him, M.D. PROCEDURE DATE:  08/27/2013 PROCEDURE:  EGD, diagnostic ASA CLASS:     Class IV INDICATIONS:  melena, anemia. MEDICATIONS: MAC sedation, administered by CRNA TOPICAL ANESTHETIC: Cetacaine Spray Lidocaine Spray  DESCRIPTION OF PROCEDURE: After the risks benefits and alternatives of the procedure were thoroughly explained, informed consent was obtained.  The Pentax Gastroscope [id number] endoscope was introduced through the mouth and advanced to the second portion of the duodenum. Without limitations.  The instrument was slowly withdrawn as the mucosa was fully examined.     Findings:  Focal segment, about 10cm in length, in proximal esophagus with fibrosis and some tissue friability and ulceration, likely sequelae from prior radiation treatments for his lung cancer.  Remainder of esophagus normal; no Mallory-Weiss tear or esophageal varices were seen.  In the cardia, upon retroflexion, there was some circumferential "plump" and nodular tissue, with some interspersed areas of mild ulceration; suspect this is benign hyperplastic tissue, but atypical gastric varices or some sort of vascular anomaly were not completely excluded, thus I did not obtain biopsies.  Remainder of stomach and pylorus normal. Normal duodenum to the second portion.  No old or fresh blood seen to the extent of our examination.          The scope was then withdrawn from the patient and the procedure completed.  ENDOSCOPIC IMPRESSION:     As above.  Esophageal findings are likely chronic, in setting of prior radiation treatments, perhaps exacerbated by component of acid reflux.  Findings in cardia are likely benign, but atypical varices not excluded,  biopsies not obtained.  Of note, no upper abdominal varices or vascular anomaly was seen on patient's recent CT chest with contrast.  The findings in both esophagus and stomach could potentially lead to melena, but there is no evidence of recent bleeding.  RECOMMENDATIONS:     1.  Watch for potential complications of procedure. 2.  While the chance for bleeding from the areas seen on today's endoscopy with anticoagulation post-operatively would not be zero, I feel it would be fairly low.  Accordingly, would proceed with whatever heart valve testing or intervention is felt appropriate, from a GI perspective. 3.  Clear liquid diet, advance as tolerated. 4.  PPI. 5.  [facility] will follow.  Could consider elective evaluation of patient's endoscopy findings, as outpatient, upon optimization of his valvular heart disease.  eSigned:  Arta Silence, MD 08/27/2013 12:18 PM   CC:

## 2013-08-27 NOTE — Anesthesia Postprocedure Evaluation (Signed)
Anesthesia Post Note  Patient: Gerald Cooke  Procedure(s) Performed: Procedure(s) (LRB): ESOPHAGOGASTRODUODENOSCOPY (EGD) (Left)  Anesthesia type: MAC  Patient location: PACU  Post pain: Pain level controlled  Post assessment: Patient's Cardiovascular Status Stable  Last Vitals:  Filed Vitals:   08/27/13 1215  BP:   Pulse: 98  Temp:   Resp: 17    Post vital signs: Reviewed and stable  Level of consciousness: alert  Complications: No apparent anesthesia complications

## 2013-08-27 NOTE — Transfer of Care (Signed)
Immediate Anesthesia Transfer of Care Note  Patient: Gerald Cooke  Procedure(s) Performed: Procedure(s): ESOPHAGOGASTRODUODENOSCOPY (EGD) (Left)  Patient Location: Endoscopy Unit  Anesthesia Type:MAC  Level of Consciousness: awake, alert , oriented and patient cooperative  Airway & Oxygen Therapy: Patient Spontanous Breathing  Post-op Assessment: Report given to PACU RN, Post -op Vital signs reviewed and stable and Patient moving all extremities X 4  Post vital signs: Reviewed and stable  Complications: No apparent anesthesia complications

## 2013-08-27 NOTE — OR Nursing (Signed)
Vital signs documented during procedure by CRNA

## 2013-08-27 NOTE — Progress Notes (Addendum)
[address]       [place] [address]             [phone]     CARDIOTHORACIC SURGERY PROGRESS NOTE  Day of Surgery  S/P Procedure(s) (LRB): ESOPHAGOGASTRODUODENOSCOPY (EGD) (Left)  Subjective: No complaints.  Did well with EGD.  Results noted.  Objective: Vital signs in last 24 hours: Temp:  [97.3 F (36.3 C)-98.9 F (37.2 C)] 97.3 F (36.3 C) (02/05 1440) Pulse Rate:  [80-109] 94 (02/05 1440) Cardiac Rhythm:  [-]  Resp:  [16-24] 20 (02/05 1440) BP: (91-128)/(31-64) 91/51 mmHg (02/05 1440) SpO2:  [97 %-100 %] 100 % (02/05 1440) Weight:  [56.5 kg (124 lb 9 oz)] 56.5 kg (124 lb 9 oz) (02/05 0500)  Physical Exam:  Rhythm:   sinus  Breath sounds: Fairly clear  Heart sounds:  RRR w/ systolic murmur  Incisions:  n/a  Abdomen:  soft  Extremities:  warm   Intake/Output from previous day: 02/04 0701 - 02/05 0700 In: -  Out: 504 [Urine:500] Intake/Output this shift: Total I/O In: 200 [I.V.:200] Out: -   Lab Results:  Recent Labs  08/26/13 0405 08/27/13 0600  WBC 7.1 7.1  HGB 9.7* 9.2*  HCT 29.7* 28.2*  PLT 241 229   BMET:  Recent Labs  08/26/13 0405 08/27/13 0600  NA 141 144  K 5.4* 4.9  CL 101 103  CO2 25 25  GLUCOSE 91 88  BUN 50* 29*  CREATININE 6.50* 4.27*  CALCIUM 9.7 8.8    CBG (last 3)  No results found for this basename: GLUCAP,  in the last 72 hours PT/INR:   Recent Labs  08/25/13 0305  LABPROT 12.5  INR 0.95    CXR:  N/A    Pulmonary Function Tests  Baseline      Post-bronchodilator  FVC  2.11 L  (54% predicted) FVC  2.00 L  (51% predicted) FEV1  1.83 L  (64% predicted) FEV1  1.44 L  (50% predicted) FEF25-75 spurious number reported FEF25-75 0.79 L  (37% predicted)  RV  Not done  DLCO  Not done    STS Risk Calculator  Procedure    AVR + CABG  Risk of Mortality   17.7% Morbidity or Mortality  48.0% Prolonged LOS   33.7% Short LOS    7.3% Permanent Stroke   3.3% Prolonged Vent  Support  38.6% DSW Infection    0.8% Renal Failure    n/a Reoperation    20.6%    Assessment/Plan: S/P Procedure(s) (LRB): ESOPHAGOGASTRODUODENOSCOPY (EGD) (Left)  Results of EGD and PFT's noted.    Records from hospitalization in 2008 at [facility] reviewed due to reported heparin "allergy".  D/C summary states that patient had prolonged thrombocytopenia after emergency repair of ruptured AAA that was complicated by exsanguinating hemorrhage immediately post-op, requiring take-back to OR for revision.  Prolonged shock led to ARF and ischemic colitis.  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  At this point will ask Dr Burt Knack to see patient and review cath films.  Should we repeat cath with IVUS to evaluate significance of left main stenosis?  If left main stenosis is not severe should we consider TAVR?  If left main is significant would TAVR + stent of left main using CPB be an option worth consideration?  At this point I can state with confidence that I do not think Mr Abboud should be considered  a candidate for conventional surgical AVR +/- CABG.  STS risk model estimates noted above do not take into account the presence of lung cancer or previous radiation therapy to the chest and mediastinum, which are clearly relevant in this case.   Cayley Pester H 08/27/2013 4:28 PM

## 2013-08-27 NOTE — H&P (View-Only) (Signed)
Admit: 08/24/2013 LOS: 3  10M ESRD MWF [facility] AVF with critical AS and L main CAD, here for CT Surg / Cardiology Eval.  Has colostomy with recent melena, for GI eval.   Subjective:  HD yesterday, below EDW. No UF Amlodipine reduced today EGD planned   02/04 0701 - 02/05 0700 In: -  Out: 504 [Urine:500]  Filed Weights   08/25/13 0527 08/26/13 0400 08/27/13 0500  Weight: 56.972 kg (125 lb 9.6 oz) 56.065 kg (123 lb 9.6 oz) 56.5 kg (124 lb 9 oz)    Current meds: reviewed  Current Labs: reviewed   Outpt HD Orders Unit: [place] Days: MWF  Time: 3h  Dialyzer: F180  EDW: 60.5kg  K/Ca: 2K/2Ca  Access: AVG RUA  Needle Size: 15g  BFR: 450  UF Proflie: none  VDRA: Hectorol 4qTx  EPO: 8200 qTx  Heparin: NO HEPARIN    Physical Exam:  Blood pressure 98/53, pulse 80, temperature 97.3 F (36.3 C), temperature source Oral, resp. rate 16, height 5\' 7"  (1.702 m), weight 56.5 kg (124 lb 9 oz), SpO2 97.00%. GEN: NAD  ENT: NCAT. Fair dentition  EYES: EOMI  CV: 3/6 HSM best at RUSB but also at apex. RRR  PULM: CTAB. No crackles  ABD: s/nt. R sided colostomy pink, stool in bag, normal appearance  SKIN: no rashes/lesions  EXT:No LEE b/l  VASC: R RC AVF +B/T   Assessment/Plan 1. ESRD: on maintenance MWF.  Below EDW.  NPO for procedures often here in hospital.  Minimal UF tomorrow again.  BP on low side.  CCB has been reduced. NO HEPARIN.   2. MBD: Cont VDRA, binder. Phos at goal 3. Anemia: changed over to aranesp inhouse 4. CAD, severe AS: plan developing with cardiology/CT surgery 5. Melena, ? GIB: EGD today, GI following.    [name] MD 08/27/2013, 10:25 AM   Recent Labs Lab 08/25/13 0305 08/26/13 0405 08/27/13 0600  NA 141 141 144  K 4.6 5.4* 4.9  CL 100 101 103  CO2 29 25 25   GLUCOSE 72 91 88  BUN 37* 50* 29*  CREATININE 4.82* 6.50* 4.27*  CALCIUM 9.2 9.7 8.8  PHOS 3.5  --   --     Recent Labs Lab 08/25/13 0305 08/26/13 0405 08/27/13 0600   WBC 7.5 7.1 7.1  HGB 9.4* 9.7* 9.2*  HCT 27.7* 29.7* 28.2*  MCV 101.8* 103.5* 104.1*  PLT 229 241 229    Current Facility-Administered Medications  Medication Dose Route Frequency Provider Last Rate Last Dose  . 0.9 %  sodium chloride infusion  250 mL Intravenous PRN Roger A Arguello, PA-C      . acetaminophen (TYLENOL) tablet 650 mg  650 mg Oral Q4H PRN Roger A Arguello, PA-C      . albuterol (PROVENTIL) (2.5 MG/3ML) 0.083% nebulizer solution 2.5 mg  2.5 mg Nebulization Once Rexene Alberts, MD      . ALPRAZolam Duanne Moron) tablet 0.25 mg  0.25 mg Oral BID PRN Roger A Arguello, PA-C      . amLODipine (NORVASC) tablet 5 mg  5 mg Oral Daily Sueanne Margarita, MD      . aspirin EC tablet 81 mg  81 mg Oral Daily Roger A Arguello, PA-C   81 mg at 08/26/13 1032  . atorvastatin (LIPITOR) tablet 20 mg  20 mg Oral q1800 Roger A Arguello, PA-C   20 mg at 08/26/13 1713  . ipratropium-albuterol (DUONEB) 0.5-2.5 (3) MG/3ML nebulizer solution 3 mL  3  mL Nebulization Q4H PRN Pixie Casino, MD      . loratadine (CLARITIN) tablet 10 mg  10 mg Oral Daily Rhonda G Barrett, PA-C   10 mg at 08/26/13 1032  . metoprolol tartrate (LOPRESSOR) tablet 25 mg  25 mg Oral BID Roger A Arguello, PA-C   25 mg at 08/26/13 2214  . nitroGLYCERIN (NITROSTAT) SL tablet 0.4 mg  0.4 mg Sublingual Q5 Min x 3 PRN Roger A Arguello, PA-C      . ondansetron (ZOFRAN) injection 4 mg  4 mg Intravenous Q6H PRN Roger A Arguello, PA-C      . pantoprazole (PROTONIX) injection 40 mg  40 mg Intravenous Q12H Roger A Arguello, PA-C   40 mg at 08/26/13 2214  . sevelamer carbonate (RENVELA) tablet 2,400 mg  2,400 mg Oral TID WC Pixie Casino, MD   2,400 mg at 08/26/13 1713  . sodium chloride 0.9 % injection 3 mL  3 mL Intravenous Q12H Roger A Arguello, PA-C   3 mL at 08/26/13 2215  . sodium chloride 0.9 % injection 3 mL  3 mL Intravenous PRN Roger A Arguello, PA-C   3 mL at 08/26/13 1032  . sucralfate (CARAFATE) 1 GM/10ML suspension 1 g  1 g Oral  Q6H Roger A Arguello, PA-C   1 g at 08/27/13 0630  . zolpidem (AMBIEN) tablet 5 mg  5 mg Oral QHS PRN Meriel Pica, PA-C   5 mg at 08/26/13 2214

## 2013-08-27 NOTE — Addendum Note (Signed)
Addendum created 08/27/13 1317 by Ned Grace, CRNA   Modules edited: Anesthesia Flowsheet, Anesthesia Medication Administration

## 2013-08-28 ENCOUNTER — Encounter (HOSPITAL_COMMUNITY): Payer: Self-pay | Admitting: Gastroenterology

## 2013-08-28 ENCOUNTER — Encounter (HOSPITAL_COMMUNITY)
Admission: AD | Disposition: A | Discharge status: Home / Self Care | Payer: Medicare Other | Source: Other Acute Inpatient Hospital | Attending: Internal Medicine

## 2013-08-28 DIAGNOSIS — I701 Atherosclerosis of renal artery: Secondary | ICD-10-CM

## 2013-08-28 DIAGNOSIS — I2510 Atherosclerotic heart disease of native coronary artery without angina pectoris: Secondary | ICD-10-CM

## 2013-08-28 DIAGNOSIS — I359 Nonrheumatic aortic valve disorder, unspecified: Secondary | ICD-10-CM

## 2013-08-28 HISTORY — PX: INTRAVASCULAR ULTRASOUND: SHX5452

## 2013-08-28 LAB — BASIC METABOLIC PANEL
BUN: 44 mg/dL — AB (ref 6–23)
CO2: 25 mEq/L (ref 19–32)
Calcium: 9.2 mg/dL (ref 8.4–10.5)
Chloride: 102 mEq/L (ref 96–112)
Creatinine, Ser: 5.96 mg/dL — ABNORMAL HIGH (ref 0.50–1.35)
GFR calc Af Amer: 10 mL/min — ABNORMAL LOW (ref >90)
GFR, EST NON AFRICAN AMERICAN: 9 mL/min — AB (ref >90)
GLUCOSE: 96 mg/dL (ref 70–99)
POTASSIUM: 5.2 meq/L (ref 3.7–5.3)
SODIUM: 141 meq/L (ref 137–147)

## 2013-08-28 LAB — HEPARIN INDUCED THROMBOCYTOPENIA PNL
HEPARIN INDUCED PLT AB: NEGATIVE
Patient O.D.: 0.094
UFH High Dose UFH H: 0 % Release
UFH Low Dose 0.1 IU/mL: 0 % Release
UFH Low Dose 0.5 IU/mL: 0 % Release
UFH SRA Result: NEGATIVE

## 2013-08-28 LAB — CBC
HEMATOCRIT: 29.1 % — AB (ref 39.0–52.0)
HEMOGLOBIN: 9.5 g/dL — AB (ref 13.0–17.0)
MCH: 34.1 pg — AB (ref 26.0–34.0)
MCHC: 32.6 g/dL (ref 30.0–36.0)
MCV: 104.3 fL — ABNORMAL HIGH (ref 78.0–100.0)
Platelets: 247 10*3/uL (ref 150–400)
RBC: 2.79 MIL/uL — AB (ref 4.22–5.81)
RDW: 15.7 % — ABNORMAL HIGH (ref 11.5–15.5)
WBC: 6.9 10*3/uL (ref 4.0–10.5)

## 2013-08-28 LAB — POCT ACTIVATED CLOTTING TIME
ACTIVATED CLOTTING TIME: 310 s
ACTIVATED CLOTTING TIME: 155 s

## 2013-08-28 SURGERY — INTRAVASCULAR ULTRASOUND
Anesthesia: LOCAL

## 2013-08-28 MED ORDER — BIVALIRUDIN 250 MG IV SOLR
INTRAVENOUS | Status: AC
  Filled 2013-08-28: qty 250

## 2013-08-28 MED ORDER — SODIUM CHLORIDE 0.9 % IV SOLN: INTRAVENOUS | Status: DC

## 2013-08-28 MED ORDER — SODIUM CHLORIDE 0.9 % IV SOLN: 250.0000 mL | INTRAVENOUS | Status: DC | PRN

## 2013-08-28 MED ORDER — OXYCODONE-ACETAMINOPHEN 5-325 MG PO TABS
1.0000 | ORAL_TABLET | ORAL | Status: DC | PRN
Start: 2013-08-28 — End: 2013-08-29

## 2013-08-28 MED ORDER — SODIUM CHLORIDE 0.9 % IJ SOLN: 3.0000 mL | Freq: Two times a day (BID) | INTRAMUSCULAR | Status: DC

## 2013-08-28 MED ORDER — MIDAZOLAM HCL 2 MG/2ML IJ SOLN
INTRAMUSCULAR | Status: AC
  Filled 2013-08-28: qty 2

## 2013-08-28 MED ORDER — LIDOCAINE HCL (PF) 1 % IJ SOLN
INTRAMUSCULAR | Status: AC
  Filled 2013-08-28: qty 30

## 2013-08-28 MED ORDER — NITROGLYCERIN 0.2 MG/ML ON CALL CATH LAB
INTRAVENOUS | Status: AC
  Filled 2013-08-28: qty 1

## 2013-08-28 MED ORDER — SODIUM CHLORIDE 0.9 % IJ SOLN
3.0000 mL | INTRAMUSCULAR | Status: DC | PRN
Start: 2013-08-28 — End: 2013-08-28

## 2013-08-28 MED ORDER — DARBEPOETIN ALFA-POLYSORBATE 100 MCG/0.5ML IJ SOLN
INTRAMUSCULAR | Status: AC
  Filled 2013-08-28: qty 0.5

## 2013-08-28 MED ORDER — DIAZEPAM 5 MG PO TABS
5.0000 mg | ORAL_TABLET | ORAL | Status: AC
  Administered 2013-08-28: 5 mg via ORAL
  Filled 2013-08-28: qty 1

## 2013-08-28 MED ORDER — ANTICOAGULANT SODIUM CITRATE 4% (200MG/5ML) IV SOLN: 5.0000 mL | Status: DC | PRN

## 2013-08-28 MED ORDER — AMLODIPINE BESYLATE 2.5 MG PO TABS
2.5000 mg | ORAL_TABLET | Freq: Every day | ORAL | Status: DC
  Administered 2013-08-28 – 2013-08-29 (×2): 2.5 mg via ORAL
  Filled 2013-08-28 (×2): qty 1

## 2013-08-28 MED ORDER — LIDOCAINE-PRILOCAINE 2.5-2.5 % EX CREA
1.0000 "application " | TOPICAL_CREAM | CUTANEOUS | Status: DC | PRN
  Filled 2013-08-28: qty 5

## 2013-08-28 MED ORDER — HEPARIN (PORCINE) IN NACL 2-0.9 UNIT/ML-% IJ SOLN
INTRAMUSCULAR | Status: AC
  Filled 2013-08-28: qty 1000

## 2013-08-28 MED ORDER — LIDOCAINE HCL (PF) 1 % IJ SOLN: 5.0000 mL | INTRAMUSCULAR | Status: DC | PRN

## 2013-08-28 MED ORDER — FENTANYL CITRATE 0.05 MG/ML IJ SOLN
INTRAMUSCULAR | Status: AC
  Filled 2013-08-28: qty 2

## 2013-08-28 MED ORDER — SODIUM CHLORIDE 0.9 % IV SOLN
100.0000 mL | INTRAVENOUS | Status: DC | PRN
Start: 2013-08-28 — End: 2013-08-29

## 2013-08-28 MED ORDER — NEPRO/CARBSTEADY PO LIQD
237.0000 mL | ORAL | Status: DC | PRN
  Filled 2013-08-28: qty 237

## 2013-08-28 MED ORDER — SODIUM CHLORIDE 0.9 % IV SOLN: 100.0000 mL | INTRAVENOUS | Status: DC | PRN

## 2013-08-28 MED ORDER — DOXERCALCIFEROL 4 MCG/2ML IV SOLN
INTRAVENOUS | Status: AC
  Filled 2013-08-28: qty 2

## 2013-08-28 MED ORDER — PENTAFLUOROPROP-TETRAFLUOROETH EX AERO
1.0000 "application " | INHALATION_SPRAY | CUTANEOUS | Status: DC | PRN
  Filled 2013-08-28: qty 103.5

## 2013-08-28 NOTE — Progress Notes (Signed)
SUBJECTIVE:  Denies any chest pain or SOB  OBJECTIVE:   Vitals:   Filed Vitals:   08/28/13 0815 08/28/13 0830 08/28/13 0900 08/28/13 0930  BP: 90/50 97/47 98/53  108/58  Pulse: 104 110 102   Temp:      TempSrc:      Resp:      Height:      Weight:      SpO2:       I&O's:   Intake/Output Summary (Last 24 hours) at 08/28/13 0953 Last data filed at 08/27/13 1200  Gross per 24 hour  Intake    200 ml  Output      0 ml  Net    200 ml   TELEMETRY: Reviewed telemetry pt in :NSR     PHYSICAL EXAM General: Well developed, well nourished, in no acute distress Head: Eyes PERRLA, No xanthomas.   Normal cephalic and atramatic  Lungs:   Clear bilaterally to auscultation and percussion. Heart:   HRRR S1 S2 Pulses are 2+ & equal.2/6 harsh late peaking SM at RUSB to LLSB Abdomen: Bowel sounds are positive, abdomen soft and non-tender without masses Extremities:   No clubbing, cyanosis or edema.  DP +1 Neuro: Alert and oriented X 3. Psych:  Good affect, responds appropriately   LABS: Basic Metabolic Panel:  Recent Labs  08/27/13 0600 08/28/13 0445  NA 144 141  K 4.9 5.2  CL 103 102  CO2 25 25  GLUCOSE 88 96  BUN 29* 44*  CREATININE 4.27* 5.96*  CALCIUM 8.8 9.2   Liver Function Tests: No results found for this basename: AST, ALT, ALKPHOS, BILITOT, PROT, ALBUMIN,  in the last 72 hours No results found for this basename: LIPASE, AMYLASE,  in the last 72 hours CBC:  Recent Labs  08/27/13 0600 08/28/13 0445  WBC 7.1 6.9  HGB 9.2* 9.5*  HCT 28.2* 29.1*  MCV 104.1* 104.3*  PLT 229 247   Cardiac Enzymes: No results found for this basename: CKTOTAL, CKMB, CKMBINDEX, TROPONINI,  in the last 72 hours BNP: No components found with this basename: POCBNP,  D-Dimer: No results found for this basename: DDIMER,  in the last 72 hours Hemoglobin A1C: No results found for this basename: HGBA1C,  in the last 72 hours Fasting Lipid Panel: No results found for this basename:  CHOL, HDL, LDLCALC, TRIG, CHOLHDL, LDLDIRECT,  in the last 72 hours Thyroid Function Tests: No results found for this basename: TSH, T4TOTAL, FREET3, T3FREE, THYROIDAB,  in the last 72 hours Anemia Panel: No results found for this basename: VITAMINB12, FOLATE, FERRITIN, TIBC, IRON, RETICCTPCT,  in the last 72 hours Coag Panel:   Lab Results  Component Value Date   INR 0.95 08/25/2013    RADIOLOGY: Dg Chest 2 View  08/25/2013   CLINICAL DATA:  Shortness of Breath  EXAM: CHEST  2 VIEW  COMPARISON:  August 21, 2013  FINDINGS: There is persistent volume loss in the right upper lobe with a cavitary lesion which appear stable. There is mild scarring in the bases. There is calcification along both hemidiaphragms as well as along several pleural surfaces consistent with previous asbestos exposure. There is some localized pleural thickening in the left mid hemi thorax, stable. There is no new opacity. There is no edema or pulmonary venous hypertension. No cardiomegaly. There is a stent in the innominate vein regions.  IMPRESSION: No appreciable change from the most recent prior study. Cavitary lesion with a cicatrization in the right upper lobe, stable. Evidence  of previous asbestos exposure. No frank edema or consolidation. No overt congestive heart failure.   Electronically Signed   By: Lowella Grip M.D.   On: 08/25/2013 08:05   Ct Angio Chest Pe W/cm &/or Wo Cm  08/25/2013   CLINICAL DATA:  Rule out pulmonary embolism, follow-up lung malignancy  EXAM: CT ANGIOGRAPHY CHEST WITH CONTRAST  TECHNIQUE: Multidetector CT imaging of the chest was performed using the standard protocol during bolus administration of intravenous contrast. Multiplanar CT image reconstructions including MIPs were obtained to evaluate the vascular anatomy.  CONTRAST:  149mL OMNIPAQUE IOHEXOL 350 MG/ML SOLN  COMPARISON:  DG CHEST 2 VIEW dated 08/25/2013; CT ANGIO CHEST dated 05/29/2013  FINDINGS: Contrast within the pulmonary arterial tree  is normal in appearance. There are no filling defects to suggest an acute pulmonary embolism. The caliber of the thoracic aorta is normal. No false lumen is demonstrated. The cardiac chambers are mildly enlarged and stable. There is no pericardial or pleural effusion. Coronary artery calcifications are demonstrated. Calcifications of the aortic and mitral valve are stable. No bulky mediastinal or hilar lymph nodes are demonstrated. The caliber of the thoracic esophagus appears normal.  There is abnormal parenchymal consolidation in the right upper lobe consistent with a thick-walled cavitary lesion. Allowing for differences in positioning this has not significantly changed since the previous study. Density previously demonstrated in the posterior superior aspect of the right lower lobe has improved and increased interstitial density posteriorly in the left lower lobe is less prominent today.  Within the upper abdomen the observed portions of the liver and spleen appear normal. The right kidney is atrophic where visualized. Calcification of the visualized portions of the left adrenal gland is demonstrated. Calcification of the pleura over the hemidiaphragms is stable.  The observed portions of the ribs exhibit no acute abnormalities. The thoracic vertebral bodies are preserved in height with the exception of T5 which exhibits mild anterior wedging which is stable. There is prominent thoracic kyphosis.  Review of the MIP images confirms the above findings.  IMPRESSION: 1. There is no evidence of an acute pulmonary embolism or acute thoracic aortic pathology. 2. The small bilateral pleural effusions seen on the study of November 2014 have resolved. 3. There is stable increased density in the right upper lobe. Patchy interstitial density in the lower lobes bilaterally has resolved. 4. Extensive pleural calcifications are present bilaterally.   Electronically Signed   By: David  Martinique   On: 08/25/2013 15:22    Assessment/Plan:  Principal Problem:  Severe aortic stenosis  Active Problems:  Hypertension controlled and on the low side  Weight loss  Chronic kidney disease, stage 5, kidney failure  GERD (gastroesophageal reflux disease)  Chronic diastolic congestive heart failure  Malnutrition  Hypoalbuminemia  Anemia in chronic kidney disease  GI (gastrointestinal bleed)  Heparin allergy  ESRD (end stage renal disease) on dialysis, MWF  PLAN:  1. Upper Endoscopy this am to evaluate source of GI bleeding  2.  IVUS of LM by Dr. Burt Knack later today 2. Await final decision on CABG/AVR vs. possible TAVR pending outcome of LM IVUS 3. Continue Amlodipine/ASA/beta blocker/statin 4. Decrease amlodipine to 2.5mg  daily for borderline low BP       Sueanne Margarita, MD  08/28/2013  9:53 AM

## 2013-08-28 NOTE — Procedures (Signed)
I was present at this dialysis session. I have reviewed the session itself and made appropriate changes.   1L UF, still under outpt EDW.  Lying flat, no edema.  BP soft. RS  [name]  MD 08/28/2013, 8:42 AM

## 2013-08-28 NOTE — Interval H&P Note (Signed)
History and Physical Interval Note:  08/28/2013 2:30 PM  Gerald Cooke  has presented today for surgery, with the diagnosis of CAD  The various methods of treatment have been discussed with the patient and family. After consideration of risks, benefits and other options for treatment, the patient has consented to  Procedure(s): INTRAVASCULAR ULTRASOUND (N/A) as a surgical intervention .  The patient's history has been reviewed, patient examined, no change in status, stable for surgery.  I have reviewed the patient's chart and labs.  Questions were answered to the patient's satisfaction.     Sherren Mocha

## 2013-08-28 NOTE — H&P (View-Only) (Signed)
SUBJECTIVE:  Denies any chest pain or SOB  OBJECTIVE:   Vitals:   Filed Vitals:   08/28/13 0815 08/28/13 0830 08/28/13 0900 08/28/13 0930  BP: 90/50 97/47 98/53  108/58  Pulse: 104 110 102   Temp:      TempSrc:      Resp:      Height:      Weight:      SpO2:       I&O's:   Intake/Output Summary (Last 24 hours) at 08/28/13 0953 Last data filed at 08/27/13 1200  Gross per 24 hour  Intake    200 ml  Output      0 ml  Net    200 ml   TELEMETRY: Reviewed telemetry pt in :NSR     PHYSICAL EXAM General: Well developed, well nourished, in no acute distress Head: Eyes PERRLA, No xanthomas.   Normal cephalic and atramatic  Lungs:   Clear bilaterally to auscultation and percussion. Heart:   HRRR S1 S2 Pulses are 2+ & equal.2/6 harsh late peaking SM at RUSB to LLSB Abdomen: Bowel sounds are positive, abdomen soft and non-tender without masses Extremities:   No clubbing, cyanosis or edema.  DP +1 Neuro: Alert and oriented X 3. Psych:  Good affect, responds appropriately   LABS: Basic Metabolic Panel:  Recent Labs  08/27/13 0600 08/28/13 0445  NA 144 141  K 4.9 5.2  CL 103 102  CO2 25 25  GLUCOSE 88 96  BUN 29* 44*  CREATININE 4.27* 5.96*  CALCIUM 8.8 9.2   Liver Function Tests: No results found for this basename: AST, ALT, ALKPHOS, BILITOT, PROT, ALBUMIN,  in the last 72 hours No results found for this basename: LIPASE, AMYLASE,  in the last 72 hours CBC:  Recent Labs  08/27/13 0600 08/28/13 0445  WBC 7.1 6.9  HGB 9.2* 9.5*  HCT 28.2* 29.1*  MCV 104.1* 104.3*  PLT 229 247   Cardiac Enzymes: No results found for this basename: CKTOTAL, CKMB, CKMBINDEX, TROPONINI,  in the last 72 hours BNP: No components found with this basename: POCBNP,  D-Dimer: No results found for this basename: DDIMER,  in the last 72 hours Hemoglobin A1C: No results found for this basename: HGBA1C,  in the last 72 hours Fasting Lipid Panel: No results found for this basename:  CHOL, HDL, LDLCALC, TRIG, CHOLHDL, LDLDIRECT,  in the last 72 hours Thyroid Function Tests: No results found for this basename: TSH, T4TOTAL, FREET3, T3FREE, THYROIDAB,  in the last 72 hours Anemia Panel: No results found for this basename: VITAMINB12, FOLATE, FERRITIN, TIBC, IRON, RETICCTPCT,  in the last 72 hours Coag Panel:   Lab Results  Component Value Date   INR 0.95 08/25/2013    RADIOLOGY: Dg Chest 2 View  08/25/2013   CLINICAL DATA:  Shortness of Breath  EXAM: CHEST  2 VIEW  COMPARISON:  August 21, 2013  FINDINGS: There is persistent volume loss in the right upper lobe with a cavitary lesion which appear stable. There is mild scarring in the bases. There is calcification along both hemidiaphragms as well as along several pleural surfaces consistent with previous asbestos exposure. There is some localized pleural thickening in the left mid hemi thorax, stable. There is no new opacity. There is no edema or pulmonary venous hypertension. No cardiomegaly. There is a stent in the innominate vein regions.  IMPRESSION: No appreciable change from the most recent prior study. Cavitary lesion with a cicatrization in the right upper lobe, stable. Evidence  of previous asbestos exposure. No frank edema or consolidation. No overt congestive heart failure.   Electronically Signed   By: Lowella Grip M.D.   On: 08/25/2013 08:05   Ct Angio Chest Pe W/cm &/or Wo Cm  08/25/2013   CLINICAL DATA:  Rule out pulmonary embolism, follow-up lung malignancy  EXAM: CT ANGIOGRAPHY CHEST WITH CONTRAST  TECHNIQUE: Multidetector CT imaging of the chest was performed using the standard protocol during bolus administration of intravenous contrast. Multiplanar CT image reconstructions including MIPs were obtained to evaluate the vascular anatomy.  CONTRAST:  133mL OMNIPAQUE IOHEXOL 350 MG/ML SOLN  COMPARISON:  DG CHEST 2 VIEW dated 08/25/2013; CT ANGIO CHEST dated 05/29/2013  FINDINGS: Contrast within the pulmonary arterial tree  is normal in appearance. There are no filling defects to suggest an acute pulmonary embolism. The caliber of the thoracic aorta is normal. No false lumen is demonstrated. The cardiac chambers are mildly enlarged and stable. There is no pericardial or pleural effusion. Coronary artery calcifications are demonstrated. Calcifications of the aortic and mitral valve are stable. No bulky mediastinal or hilar lymph nodes are demonstrated. The caliber of the thoracic esophagus appears normal.  There is abnormal parenchymal consolidation in the right upper lobe consistent with a thick-walled cavitary lesion. Allowing for differences in positioning this has not significantly changed since the previous study. Density previously demonstrated in the posterior superior aspect of the right lower lobe has improved and increased interstitial density posteriorly in the left lower lobe is less prominent today.  Within the upper abdomen the observed portions of the liver and spleen appear normal. The right kidney is atrophic where visualized. Calcification of the visualized portions of the left adrenal gland is demonstrated. Calcification of the pleura over the hemidiaphragms is stable.  The observed portions of the ribs exhibit no acute abnormalities. The thoracic vertebral bodies are preserved in height with the exception of T5 which exhibits mild anterior wedging which is stable. There is prominent thoracic kyphosis.  Review of the MIP images confirms the above findings.  IMPRESSION: 1. There is no evidence of an acute pulmonary embolism or acute thoracic aortic pathology. 2. The small bilateral pleural effusions seen on the study of November 2014 have resolved. 3. There is stable increased density in the right upper lobe. Patchy interstitial density in the lower lobes bilaterally has resolved. 4. Extensive pleural calcifications are present bilaterally.   Electronically Signed   By: David  Martinique   On: 08/25/2013 15:22    Assessment/Plan:  Principal Problem:  Severe aortic stenosis  Active Problems:  Hypertension controlled and on the low side  Weight loss  Chronic kidney disease, stage 5, kidney failure  GERD (gastroesophageal reflux disease)  Chronic diastolic congestive heart failure  Malnutrition  Hypoalbuminemia  Anemia in chronic kidney disease  GI (gastrointestinal bleed)  Heparin allergy  ESRD (end stage renal disease) on dialysis, MWF  PLAN:  1. Upper Endoscopy this am to evaluate source of GI bleeding  2.  IVUS of LM by Dr. Burt Knack later today 2. Await final decision on CABG/AVR vs. possible TAVR pending outcome of LM IVUS 3. Continue Amlodipine/ASA/beta blocker/statin 4. Decrease amlodipine to 2.5mg  daily for borderline low BP       Sueanne Margarita, MD  08/28/2013  9:53 AM

## 2013-08-28 NOTE — Progress Notes (Addendum)
Site area: right groin  Site Prior to Removal:  Level 0  Pressure Applied For 40 MINUTES    Minutes Beginning at 1720  Manual:   yes  Patient Status During Pull:  stable  Post Pull Groin Site:  Level 0  Post Pull Instructions Given:  yes  Post Pull Pulses Present:  yes  Dressing Applied:  yes  Comments:  Pressure held for 20 minutes, small hematoma noted but no bleeding at 1740, 1743 oozing noted from site and pressure held an additional 17 minutes until 1800. Area soft with slight bruising noted. Gauze pressure dressing applied.  1900 Site checked q 15 minutes with no further bleeding noted.

## 2013-08-28 NOTE — Progress Notes (Signed)
Subjective: No abdominal pain. No further melena.  Objective: Vital signs in last 24 hours: Temp:  [97.3 F (36.3 C)-98.5 F (36.9 C)] 98.2 F (36.8 C) (02/06 0955) Pulse Rate:  [84-110] 98 (02/06 0955) Resp:  [17-24] 18 (02/06 0955) BP: (90-132)/(31-71) 118/59 mmHg (02/06 0955) SpO2:  [98 %-100 %] 99 % (02/06 0955) Weight:  [56.382 kg (124 lb 4.8 oz)-58 kg (127 lb 13.9 oz)] 57.5 kg (126 lb 12.2 oz) (02/06 0955) Weight change: -0.118 kg (-4.2 oz) Last BM Date: 08/27/13  PE: GEN:  NAD  Lab Results: CBC    Component Value Date/Time   WBC 6.9 08/28/2013 0445   RBC 2.79* 08/28/2013 0445   HGB 9.5* 08/28/2013 0445   HCT 29.1* 08/28/2013 0445   PLT 247 08/28/2013 0445   MCV 104.3* 08/28/2013 0445   MCH 34.1* 08/28/2013 0445   MCHC 32.6 08/28/2013 0445   RDW 15.7* 08/28/2013 0445   LYMPHSABS 0.6* 04/29/2012 1111   MONOABS 1.1* 04/29/2012 1111   EOSABS 0.2 04/29/2012 1111   BASOSABS 0.0 04/29/2012 1111   Assessment:  1.  Melena, resolved. 2.  Anemia, stable. 3.  Severe aortic stenosis.  Plan:  1.  PPI indefinitely. 2.  No further GI intervention planned, unless patient has recurrent bleeding. 3.  OK to proceed with whatever cardiac testing is felt appropriate. 4.  Will sign-off; thank you for the consult; please call with questions.   Landry Dyke 08/28/2013, 11:09 AM

## 2013-08-28 NOTE — Progress Notes (Signed)
Reviewed IVUS findings with patient. Reviewed notes of Dr Paulita Fujita and appreciate his care. Plan cardiac CTA tomorrow and CTA chest/abdomen/pelvis to evaluate anatomy for TAVR. OK to discharge home after that and will arrange follow-up. Change protonix to PO. Please call if any questions. thx  Sherren Mocha 08/28/2013 4:37 PM

## 2013-08-28 NOTE — CV Procedure (Signed)
   Cardiac Catheterization Procedure Note  Name: Gerald Cooke MRN: [id number] DOB: 05-May-1941  Procedure: Left Heart Cath, Selective Coronary Angiography, LV angiography  Indication: Borderline left main lesion  Procedural details: The right groin was prepped, draped, and anesthetized with 1% lidocaine. Using modified Seldinger technique, a 6 French sheath was introduced into the right femoral artery. Angiomax was used for anticoagulation. A therapeutic ACT was achieved. A 6 French JL 3.5 cm guide catheter with sideholes was utilized. A cougar guidewire was advanced into the distal LAD. Intravascular ultrasound was performed of the left main using a mechanical pullback technique. The procedure was uncomplicated.  A pigtail catheter was then advanced into the aortic root and a aortic root angiogram was performed. The catheter was pulled back into the abdominal aorta and an abdominal aortic angiogram was performed. The procedure was uncomplicated and the patient tolerated it well. There was a small, soft hematoma present around the sheath the completion of the procedure. Manual pressure will be used for hemostasis.  Procedural Findings:    Coronary angiography: Coronary dominance: right  The left main stem is heavily calcified. All the coronaries are very severely calcified on plain fluoroscopy. The aortic valve is severely calcified and restricted on plain fluoroscopy.  Intravascular ultrasound findings: There is heavy calcification of 180 of the left main. This is primarily in the mid and distal left main. The proximal left main is ovoid in shape. There is no significant stenosis noted throughout the left main. The ostium is patent without significant narrowing.  Aortic root angiogram: The aortic valve appears trileaflet. There is heavy leaflet calcification. Annular calcification is also present. There is no significant aortic insufficiency noted.  Abdominal aortic angiogram: AAA repair  site is intact. There is there is a bifurcated graft present in the aortoiliac region that is widely patent. The right external iliac is diffusely diseased without high-grade stenosis. There is heavy femoral artery calcification on the right. On the left, the external iliac is patent throughout. There is some dilatation present. The internal iliac is patent. The common femoral is patent.  Final Conclusions:   1. Heavily calcified but nonobstructive left main plaque 2. Known severe aortic stenosis 3. Intact AAA repair with patent graft and calcified but nonobstructive right external iliac disease and patent left iliofemoral vessels  Recommendations: Continue workup for TAVR  Sherren Mocha 08/28/2013, 3:26 PM

## 2013-08-29 ENCOUNTER — Encounter (HOSPITAL_COMMUNITY): Payer: Self-pay | Admitting: *Deleted

## 2013-08-29 ENCOUNTER — Inpatient Hospital Stay (HOSPITAL_COMMUNITY): Payer: Medicare Other

## 2013-08-29 LAB — CBC
HCT: 29.7 % — ABNORMAL LOW (ref 39.0–52.0)
HEMOGLOBIN: 9.5 g/dL — AB (ref 13.0–17.0)
MCH: 33.7 pg (ref 26.0–34.0)
MCHC: 32 g/dL (ref 30.0–36.0)
MCV: 105.3 fL — ABNORMAL HIGH (ref 78.0–100.0)
Platelets: 229 10*3/uL (ref 150–400)
RBC: 2.82 MIL/uL — ABNORMAL LOW (ref 4.22–5.81)
RDW: 15.5 % (ref 11.5–15.5)
WBC: 6.6 10*3/uL (ref 4.0–10.5)

## 2013-08-29 LAB — BASIC METABOLIC PANEL
BUN: 28 mg/dL — ABNORMAL HIGH (ref 6–23)
CO2: 26 mEq/L (ref 19–32)
Calcium: 8.8 mg/dL (ref 8.4–10.5)
Chloride: 103 mEq/L (ref 96–112)
Creatinine, Ser: 4.42 mg/dL — ABNORMAL HIGH (ref 0.50–1.35)
GFR, EST AFRICAN AMERICAN: 14 mL/min — AB (ref >90)
GFR, EST NON AFRICAN AMERICAN: 12 mL/min — AB (ref >90)
Glucose, Bld: 128 mg/dL — ABNORMAL HIGH (ref 70–99)
POTASSIUM: 4 meq/L (ref 3.7–5.3)
SODIUM: 141 meq/L (ref 137–147)

## 2013-08-29 MED ORDER — AMLODIPINE BESYLATE 2.5 MG PO TABS: 2.5000 mg | ORAL_TABLET | Freq: Every day | ORAL | Status: AC

## 2013-08-29 MED ORDER — IOHEXOL 350 MG/ML SOLN
70.0000 mL | Freq: Once | INTRAVENOUS | Status: DC | PRN
Start: 2013-08-29 — End: 2013-08-29

## 2013-08-29 MED ORDER — METOPROLOL TARTRATE 50 MG PO TABS
50.0000 mg | ORAL_TABLET | Freq: Once | ORAL | Status: AC
  Administered 2013-08-29: 50 mg via ORAL
  Filled 2013-08-29: qty 1

## 2013-08-29 MED ORDER — NEPRO/CARBSTEADY PO LIQD: 237.0000 mL | ORAL | Status: DC | PRN

## 2013-08-29 MED ORDER — PANTOPRAZOLE SODIUM 40 MG PO TBEC: 40.0000 mg | DELAYED_RELEASE_TABLET | Freq: Two times a day (BID) | ORAL | Status: DC

## 2013-08-29 MED ORDER — METOPROLOL TARTRATE 1 MG/ML IV SOLN
INTRAVENOUS | Status: AC
  Administered 2013-08-29: 11:00:00 5 mg
  Filled 2013-08-29: qty 5

## 2013-08-29 MED ORDER — IOHEXOL 350 MG/ML SOLN
80.0000 mL | Freq: Once | INTRAVENOUS | Status: AC | PRN
Start: 2013-08-29 — End: 2013-08-29
  Administered 2013-08-29: 80 mL via INTRAVENOUS

## 2013-08-29 MED ORDER — SUCRALFATE 1 GM/10ML PO SUSP: 1.0000 g | Freq: Four times a day (QID) | ORAL | Status: DC

## 2013-08-29 MED ORDER — OXYCODONE-ACETAMINOPHEN 5-325 MG PO TABS: 1.0000 | ORAL_TABLET | ORAL | Status: DC | PRN

## 2013-08-29 NOTE — Discharge Instructions (Signed)
Call [facility] [phone] if any bleeding, swelling or drainage at cath site.  May shower, no tub baths for 48 hours for groin sticks.   No lifting over 5 pounds. No driving for 5 days.  Heart Healthy renal diet  Follow up with dialysis as before.

## 2013-08-29 NOTE — Discharge Summary (Signed)
Gerald Cooke, Gerald Cooke 08/29/2013

## 2013-08-29 NOTE — Progress Notes (Signed)
TCTS BRIEF PROGRESS NOTE   Results of cath noted. I agree w/ plans outlined by Dr Burt Knack. Would like to wait at least 1 month to give esophagitis time to resolve before proceeding with TAVR. Will f/u in multidisciplinary heart valve clinic. Will arrange for f/u PET/CT scan to make certain lung cancer remains stable.  Gerald Cooke H 08/29/2013 11:42 AM

## 2013-08-29 NOTE — Discharge Summary (Signed)
Physician Discharge Summary       Patient ID: Gerald Cooke MRN: [id number] DOB/AGE: 04-05-1941 73 y.o.  Admit date: 08/24/2013 Discharge date: 08/29/2013  Discharge Diagnoses:  Principal Problem:   Severe aortic stenosis, plan for TAVR, no need for CABG, by cath/IVUS Active Problems:   Hypertension   Aortic stenosis   Weight loss   Chronic kidney disease, stage 5, kidney failure   GERD (gastroesophageal reflux disease)   Chronic diastolic congestive heart failure   Malnutrition   Hypoalbuminemia   Anemia in chronic kidney disease   GI (gastrointestinal bleed)   Heparin allergy   ESRD (end stage renal disease) on dialysis, MWF   Discharged Condition: stable  Procedures: 08/27/13 Upper Endoscopy by Dr. Paulita Fujita with esophageal fibrous tissue, friable tissue and ulceration likely sequale from prior radiation therapy.     Hospital Course: 73 year old WM transferred from [facility] for consideration for AVR for severe Stenosis. Cath had been done at [facility].    He has a complicated past medical history including recently discovered severe aortic stenosis, coronary artery disease, hypertension, end-stage renal disease on dialysis, stage IIIB lung cancer, anemia, and recent GI bleeding. 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 He was hospitalized at [facility] and had a prolonged convalescence. He developed acute renal failure and has remained dialysis dependent ever sinse. 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 He gradually recovered. During his time in the hospital he was noted to have a lung mass in the right upper lobe. Ultimately he was diagnosed with non-small cell lung cancer, clinical stage IIIB. He was treated with full dose radiation therapy and chemotherapy in 2009. He has  been followed by Dr. Oliva Bustard and has been felt to remain in clinical remission. In November 2014 followup chest CT scan was interpreted as demonstrating stable radiographic appearance of chronic cavitary mass in the right upper lobe consistent with the patient's previous primary lung cancer. However, there was also question possible lymphangitic spread of tumor. Subsequent PET/CT scan revealed increased uptake within the right upper lobe mass consistent with persistent or recurrent lung cancer. However, there were no other areas of increased uptake at that time to suggest distant metastatic disease. The patient was followed clinically.   The patient states that for the past several months he has developed gradual progression of symptoms of exertional shortness of breath. Within the last few weeks he has had episodes resting shortness of breath, and he was hospitalized briefly at [facility] 2 weeks ago with acute exacerbation of congestive heart failure and possible pneumonia. Symptoms resolved and the patient was discharged home. Details are not currently available. On 08/21/2013 the patient presented again to [facility] with burning epigastric chest pain that was prolonged. Patient states he has had several intermittent episodes of less severe prior to admission, but on the day of admission the symptoms persisted. He was given a GI cocktail in the emergency department and his pain resolved. He denied any associated symptoms of shortness of breath. Similar burning epigastric chest pain has not recurred over the last few days. He was noted to have melanotic stool and iron deficient anemia. His hemoglobin gradually decreased to 7.4 at which point he was transfused 1 unit of packed red blood cells. During this period of time the patient was noted to have  mildly positive cardiac enzymes and non-specific ECG changes suggestive of possible subendocardial myocardial infarction. Transthoracic echocardiogram was performed demonstrating  severe aortic stenosis with peak velocity across the valve measured 4.1 m/s corresponding to peak and mean transvalvular gradients of 69 and 45 mm mercury respectively. There was moderate global left ventricular systolic dysfunction with ejection fraction estimated 40-45%. The patient underwent diagnostic cardiac catheterization demonstrating diffuse nonobstructive coronary artery disease with exception of possible significant ostial stenosis of the left main coronary artery. Pulmonary artery pressures were normal. The patient was transferred to [facility] for further management. Cardiothoracic surgical consultation was requested.   The patient lives with his wife in [place]. He dialyzes on a Monday Wednesday Friday schedule and has done generally well with long-term dialysis therapy. He does note that recently there have been problems with low blood pressure during dialysis. He remains relatively active and functionally independent. He describes a long history of exertional shortness of breath which has progressed in recent months. He denies any history of PND, orthopnea, or lower extremity edema. He has not had significant dizzy spells nor syncope. He has never had any exertional chest pain or chest tightness.   Dr. Roxy Manns was concerned with reported allergy to heparin.  He needed further eval of CAD with IVUS to eval Lt main stenosis.  With recent GI Bleed pt also needed GI work up.  Records from hospitalization in 2008 at [facility] reviewed due to reported heparin "allergy". D/C summary states that patient had prolonged thrombocytopenia after emergency repair of ruptured AAA that was complicated by exsanguinating hemorrhage immediately post-op, requiring take-back to OR for revision. Prolonged shock led to ARF and ischemic colitis. 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 Paulita Fujita, with friable and ulceration  of esophagus but he felt this was due to radiation treatment.  He did clear for anticoagulation with surgery.  He is on protonix BID now po and should remain on indefinitely.     CT of the chest for non small cell carcinoma of the lung treated with radiation and chemo in 2009 and pt remains in remission, reveled stable lungs.  LM disease, heavily calcified, Pt underwent IVUS of the LM  By Dr. Burt Knack and found   Final Conclusions:  1. Heavily calcified but nonobstructive left main plaque  2. Known severe aortic stenosis  3. Intact AAA repair with patent graft and calcified but nonobstructive right external iliac disease and patent left iliofemoral vessels   Pt then underwent CTA  chest/abdomen/pelvis 08/29/13 to eval for anatomy for TAVR.  08/29/13 pt seen and evaluated by Dr. Meda Coffee and found stable for discharge.  Continue ASA/BB/statin.  Amlodipine was decreased due to lower borderline low BP.   Dr. Burt Knack will contact pt for follow up.  Pt will continue dialysis as before.  Consults: cardiology, GI, nephrology and vascular surgery  Significant Diagnostic Studies:  BMET    Component Value Date/Time   NA 141 08/29/2013 0317   K 4.0 08/29/2013 0317   CL 103 08/29/2013 0317   CO2 26 08/29/2013 0317   GLUCOSE 128* 08/29/2013 0317   BUN 28* 08/29/2013 0317   CREATININE 4.42* 08/29/2013 0317   CALCIUM 8.8 08/29/2013 0317   GFRNONAA 12* 08/29/2013 0317   GFRAA 14* 08/29/2013 0317    CBC    Component Value Date/Time   WBC 6.6 08/29/2013 0317   RBC 2.82* 08/29/2013 0317   HGB 9.5*  08/29/2013 0317   HCT 29.7* 08/29/2013 0317   PLT 229 08/29/2013 0317   MCV 105.3* 08/29/2013 0317   MCH 33.7 08/29/2013 0317   MCHC 32.0 08/29/2013 0317   RDW 15.5 08/29/2013 0317   LYMPHSABS 0.6* 04/29/2012 1111   MONOABS 1.1* 04/29/2012 1111   EOSABS 0.2 04/29/2012 1111   BASOSABS 0.0 04/29/2012 1111     Hepatitis B surface antigen neg.  PFTs see results in computer  CTA of Chest: 08/26/13 IMPRESSION: 1. There is no evidence of an  acute pulmonary embolism or acute thoracic aortic pathology. 2. The small bilateral pleural effusions seen on the study of November 2014 have resolved. 3. There is stable increased density in the right upper lobe. Patchy interstitial density in the lower lobes bilaterally has resolved. 4. Extensive pleural calcifications are present bilaterally.  FBP:ZWCHENIDPO: No appreciable change from the most recent prior study. Cavitary lesion with a cicatrization in the right upper lobe, stable. Evidence of previous asbestos exposure. No frank edema or consolidation. No overt congestive heart failure.  Further CTA pending at discharge.   Discharge Exam: Blood pressure 129/65, pulse 84, temperature 98.1 F (36.7 C), temperature source Oral, resp. rate 16, height 5\' 7"  (1.702 m), weight 124 lb 9 oz (56.501 kg), SpO2 100.00%.    Disposition: Home    Medication List    STOP taking these medications       diltiazem 120 MG 24 hr capsule  Commonly known as:  TIAZAC     ranitidine 150 MG tablet  Commonly known as:  ZANTAC      TAKE these medications       amLODipine 2.5 MG tablet  Commonly known as:  NORVASC  Take 1 tablet (2.5 mg total) by mouth daily.     aspirin 81 MG tablet  Take 81 mg by mouth daily.     atorvastatin 40 MG tablet  Commonly known as:  LIPITOR  Take 40 mg by mouth every evening.     cetirizine 10 MG tablet  Commonly known as:  ZYRTEC  Take 10 mg by mouth daily as needed.     COMBIVENT RESPIMAT 20-100 MCG/ACT Aers respimat  Generic drug:  Ipratropium-Albuterol  Inhale 1 puff into the lungs as needed for wheezing. Every 6 hours as needed     feeding supplement (NEPRO CARB STEADY) Liqd  Take 237 mLs by mouth as needed (missed meal during dialysis.).     guaiFENesin-codeine 100-10 MG/5ML syrup  Commonly known as:  ROBITUSSIN AC  Take 5 mLs by mouth 2 (two) times daily as needed for cough.     loperamide 2 MG capsule  Commonly known as:  IMODIUM  Take 2  mg by mouth as needed for diarrhea or loose stools.     metoprolol tartrate 25 MG tablet  Commonly known as:  LOPRESSOR  Take 25 mg by mouth daily.     ondansetron 8 MG tablet  Commonly known as:  ZOFRAN  Take 1 tablet (8 mg total) by mouth every 6 (six) hours as needed.     oxyCODONE-acetaminophen 5-325 MG per tablet  Commonly known as:  PERCOCET/ROXICET  Take 1-2 tablets by mouth every 4 (four) hours as needed for moderate pain.     pantoprazole 40 MG tablet  Commonly known as:  PROTONIX  Take 1 tablet (40 mg total) by mouth 2 (two) times daily.     sevelamer carbonate 800 MG tablet  Commonly known as:  RENVELA  Take 800 mg by  mouth 3 (three) times daily with meals.     sucralfate 1 GM/10ML suspension  Commonly known as:  CARAFATE  Take 10 mLs (1 g total) by mouth every 6 (six) hours.       Follow-up Information   Follow up with Sherren Mocha, MD. (His office will call you for procedure and follow up)    Specialty:  Cardiology   Contact information:   [address] [place] [address] [phone]        Discharge Instructions: Call [facility] [phone] if any bleeding, swelling or drainage at cath site.  May shower, no tub baths for 48 hours for groin sticks.   No lifting over 5 pounds. No driving for 5 days.  Heart Healthy renal diet  Follow up with dialysis as before.   Signed: Isaiah Serge Nurse Practitioner-Certified [facility]: [facility] 08/29/2013, 11:33 AM  Time spent on discharge >40 minutes.

## 2013-08-29 NOTE — Progress Notes (Signed)
SUBJECTIVE:  Denies any chest pain or SOB this morning.  OBJECTIVE:   Vitals:   Filed Vitals:   08/29/13 0000 08/29/13 0400 08/29/13 0500 08/29/13 0753  BP: 100/47 114/48  129/65  Pulse: 84 83  84  Temp: 97.6 F (36.4 C) 97.8 F (36.6 C)  98.1 F (36.7 C)  TempSrc: Oral Oral  Oral  Resp: 16 16  16   Height:      Weight:   124 lb 9 oz (56.5 kg)   SpO2: 98% 100%  100%   I&O's:    Intake/Output Summary (Last 24 hours) at 08/29/13 0856 Last data filed at 08/28/13 2143  Gross per 24 hour  Intake    660 ml  Output    892 ml  Net   -232 ml   TELEMETRY: Reviewed telemetry pt in :NSR  PHYSICAL EXAM General: Well developed, well nourished, in no acute distress Head: Eyes PERRLA, No xanthomas.   Normal cephalic and atramatic  Lungs:   Clear bilaterally to auscultation and percussion. Heart:   HRRR S1 S2 Pulses are 2+ & equal.2/6 harsh late peaking SM at RUSB to LLSB Abdomen: Bowel sounds are positive, abdomen soft and non-tender without masses Extremities:   No clubbing, cyanosis or edema.  DP +1 Neuro: Alert and oriented X 3. Psych:  Good affect, responds appropriately  LABS: Basic Metabolic Panel:  Recent Labs  08/28/13 0445 08/29/13 0317  NA 141 141  K 5.2 4.0  CL 102 103  CO2 25 26  GLUCOSE 96 128*  BUN 44* 28*  CREATININE 5.96* 4.42*  CALCIUM 9.2 8.8   Liver Function Tests: No results found for this basename: AST, ALT, ALKPHOS, BILITOT, PROT, ALBUMIN,  in the last 72 hours No results found for this basename: LIPASE, AMYLASE,  in the last 72 hours CBC:  Recent Labs  08/28/13 0445 08/29/13 0317  WBC 6.9 6.6  HGB 9.5* 9.5*  HCT 29.1* 29.7*  MCV 104.3* 105.3*  PLT 247 229   Coag Panel:   Lab Results  Component Value Date   INR 0.95 08/25/2013   RADIOLOGY: Dg Chest 2 View  08/25/2013   CLINICAL DATA:  Shortness of Breath  EXAM: CHEST  2 VIEW  COMPARISON:  August 21, 2013  FINDINGS: There is persistent volume loss in the right upper lobe with a  cavitary lesion which appear stable. There is mild scarring in the bases. There is calcification along both hemidiaphragms as well as along several pleural surfaces consistent with previous asbestos exposure. There is some localized pleural thickening in the left mid hemi thorax, stable. There is no new opacity. There is no edema or pulmonary venous hypertension. No cardiomegaly. There is a stent in the innominate vein regions.  IMPRESSION: No appreciable change from the most recent prior study. Cavitary lesion with a cicatrization in the right upper lobe, stable. Evidence of previous asbestos exposure. No frank edema or consolidation. No overt congestive heart failure.   Electronically Signed   By: Lowella Grip M.D.   On: 08/25/2013 08:05   Ct Angio Chest Pe W/cm &/or Wo Cm  08/25/2013   CLINICAL DATA:  Rule out pulmonary embolism, follow-up lung malignancy   IMPRESSION: 1. There is no evidence of an acute pulmonary embolism or acute thoracic aortic pathology. 2. The small bilateral pleural effusions seen on the study of November 2014 have resolved. 3. There is stable increased density in the right upper lobe. Patchy interstitial density in the lower lobes bilaterally  has resolved. 4. Extensive pleural calcifications are present bilaterally.   Electronically Signed   By: David  Martinique   On: 08/25/2013 15:22   Cardiac cath 08/28/2013 Coronary angiography:  Coronary dominance: right  The left main stem is heavily calcified. All the coronaries are very severely calcified on plain fluoroscopy. The aortic valve is severely calcified and restricted on plain fluoroscopy.  Intravascular ultrasound findings: There is heavy calcification of 180 of the left main. This is primarily in the mid and distal left main. The proximal left main is ovoid in shape. There is no significant stenosis noted throughout the left main. The ostium is patent without significant narrowing.  Aortic root angiogram: The aortic valve  appears trileaflet. There is heavy leaflet calcification. Annular calcification is also present. There is no significant aortic insufficiency noted.  Abdominal aortic angiogram: AAA repair site is intact. There is there is a bifurcated graft present in the aortoiliac region that is widely patent. The right external iliac is diffusely diseased without high-grade stenosis. There is heavy femoral artery calcification on the right. On the left, the external iliac is patent throughout. There is some dilatation present. The internal iliac is patent. The common femoral is patent.  Final Conclusions:  1. Heavily calcified but nonobstructive left main plaque  2. Known severe aortic stenosis  3. Intact AAA repair with patent graft and calcified but nonobstructive right external iliac disease and patent left iliofemoral vessels  Recommendations: Continue workup for TAVR   Assessment/Plan:   Principal Problem:  Severe aortic stenosis  Active Problems:  CAD: no obstructive CAD Hypertension controlled and on the low side  Weight loss  Chronic kidney disease, stage 5, kidney failure  GERD (gastroesophageal reflux disease)  Chronic diastolic congestive heart failure  Malnutrition  Hypoalbuminemia  Anemia in chronic kidney disease  GI (gastrointestinal bleed)  Heparin allergy  ESRD (end stage renal disease) on dialysis, MWF   1. Aortic stenosis : TAVR CT today, then discharge, continue ASA/beta blocker/statin. We will give extra PO Metoprolol this am to optimize HR during CT acquisition  2. CAD: severe calcifications, non-obstructive on cath yesterday (IVUS to the LM)  3. HTN: controlled, amlodipine was decreased due to borderline low BP to 2.5 mg po daily   Ena Dawley, Lemmie Evens, MD  08/29/2013  8:56 AM

## 2013-08-31 ENCOUNTER — Other Ambulatory Visit: Payer: Self-pay | Admitting: *Deleted

## 2013-08-31 DIAGNOSIS — C3490 Malignant neoplasm of unspecified part of unspecified bronchus or lung: Secondary | ICD-10-CM

## 2013-08-31 MED FILL — Sodium Chloride IV Soln 0.9%: INTRAVENOUS | Qty: 50 | Status: AC

## 2013-09-02 DIAGNOSIS — I359 Nonrheumatic aortic valve disorder, unspecified: Secondary | ICD-10-CM

## 2013-09-10 ENCOUNTER — Encounter (HOSPITAL_COMMUNITY): Payer: Self-pay | Admitting: Emergency Medicine

## 2013-09-10 DIAGNOSIS — K219 Gastro-esophageal reflux disease without esophagitis: Secondary | ICD-10-CM | POA: Diagnosis present

## 2013-09-10 DIAGNOSIS — D631 Anemia in chronic kidney disease: Secondary | ICD-10-CM | POA: Diagnosis present

## 2013-09-10 DIAGNOSIS — K208 Other esophagitis without bleeding: Principal | ICD-10-CM | POA: Diagnosis present

## 2013-09-10 DIAGNOSIS — N039 Chronic nephritic syndrome with unspecified morphologic changes: Secondary | ICD-10-CM

## 2013-09-10 DIAGNOSIS — E875 Hyperkalemia: Secondary | ICD-10-CM | POA: Diagnosis present

## 2013-09-10 DIAGNOSIS — Z9221 Personal history of antineoplastic chemotherapy: Secondary | ICD-10-CM

## 2013-09-10 DIAGNOSIS — Z933 Colostomy status: Secondary | ICD-10-CM

## 2013-09-10 DIAGNOSIS — Z887 Allergy status to serum and vaccine status: Secondary | ICD-10-CM

## 2013-09-10 DIAGNOSIS — K921 Melena: Secondary | ICD-10-CM | POA: Diagnosis present

## 2013-09-10 DIAGNOSIS — I359 Nonrheumatic aortic valve disorder, unspecified: Secondary | ICD-10-CM | POA: Diagnosis present

## 2013-09-10 DIAGNOSIS — I120 Hypertensive chronic kidney disease with stage 5 chronic kidney disease or end stage renal disease: Secondary | ICD-10-CM | POA: Diagnosis present

## 2013-09-10 DIAGNOSIS — Z79899 Other long term (current) drug therapy: Secondary | ICD-10-CM

## 2013-09-10 DIAGNOSIS — I252 Old myocardial infarction: Secondary | ICD-10-CM

## 2013-09-10 DIAGNOSIS — Z91013 Allergy to seafood: Secondary | ICD-10-CM

## 2013-09-10 DIAGNOSIS — Z85118 Personal history of other malignant neoplasm of bronchus and lung: Secondary | ICD-10-CM

## 2013-09-10 DIAGNOSIS — N2581 Secondary hyperparathyroidism of renal origin: Secondary | ICD-10-CM | POA: Diagnosis present

## 2013-09-10 DIAGNOSIS — E785 Hyperlipidemia, unspecified: Secondary | ICD-10-CM | POA: Diagnosis present

## 2013-09-10 DIAGNOSIS — I509 Heart failure, unspecified: Secondary | ICD-10-CM | POA: Diagnosis present

## 2013-09-10 DIAGNOSIS — Z7982 Long term (current) use of aspirin: Secondary | ICD-10-CM

## 2013-09-10 DIAGNOSIS — Y842 Radiological procedure and radiotherapy as the cause of abnormal reaction of the patient, or of later complication, without mention of misadventure at the time of the procedure: Secondary | ICD-10-CM | POA: Diagnosis present

## 2013-09-10 DIAGNOSIS — Z992 Dependence on renal dialysis: Secondary | ICD-10-CM

## 2013-09-10 DIAGNOSIS — I5032 Chronic diastolic (congestive) heart failure: Secondary | ICD-10-CM | POA: Diagnosis present

## 2013-09-10 DIAGNOSIS — T66XXXS Radiation sickness, unspecified, sequela: Secondary | ICD-10-CM

## 2013-09-10 DIAGNOSIS — N186 End stage renal disease: Secondary | ICD-10-CM | POA: Diagnosis present

## 2013-09-10 DIAGNOSIS — Z87891 Personal history of nicotine dependence: Secondary | ICD-10-CM

## 2013-09-10 LAB — CBC WITH DIFFERENTIAL/PLATELET
BASOS PCT: 0 % (ref 0–1)
Basophils Absolute: 0 10*3/uL (ref 0.0–0.1)
Eosinophils Absolute: 0.3 10*3/uL (ref 0.0–0.7)
Eosinophils Relative: 3 % (ref 0–5)
HCT: 32.2 % — ABNORMAL LOW (ref 39.0–52.0)
HEMOGLOBIN: 10.3 g/dL — AB (ref 13.0–17.0)
LYMPHS ABS: 1 10*3/uL (ref 0.7–4.0)
Lymphocytes Relative: 12 % (ref 12–46)
MCH: 34.1 pg — ABNORMAL HIGH (ref 26.0–34.0)
MCHC: 32 g/dL (ref 30.0–36.0)
MCV: 106.6 fL — ABNORMAL HIGH (ref 78.0–100.0)
Monocytes Absolute: 0.5 10*3/uL (ref 0.1–1.0)
Monocytes Relative: 6 % (ref 3–12)
NEUTROS ABS: 6.3 10*3/uL (ref 1.7–7.7)
NEUTROS PCT: 79 % — AB (ref 43–77)
Platelets: 219 10*3/uL (ref 150–400)
RBC: 3.02 MIL/uL — AB (ref 4.22–5.81)
RDW: 15.7 % — ABNORMAL HIGH (ref 11.5–15.5)
WBC: 8 10*3/uL (ref 4.0–10.5)

## 2013-09-10 LAB — COMPREHENSIVE METABOLIC PANEL
ALBUMIN: 3.4 g/dL — AB (ref 3.5–5.2)
ALK PHOS: 80 U/L (ref 39–117)
ALT: 9 U/L (ref 0–53)
AST: 11 U/L (ref 0–37)
BUN: 54 mg/dL — ABNORMAL HIGH (ref 6–23)
CO2: 23 mEq/L (ref 19–32)
Calcium: 10.1 mg/dL (ref 8.4–10.5)
Chloride: 105 mEq/L (ref 96–112)
Creatinine, Ser: 5.28 mg/dL — ABNORMAL HIGH (ref 0.50–1.35)
GFR calc Af Amer: 11 mL/min — ABNORMAL LOW (ref >90)
GFR calc non Af Amer: 10 mL/min — ABNORMAL LOW (ref >90)
Glucose, Bld: 96 mg/dL (ref 70–99)
POTASSIUM: 6.7 meq/L — AB (ref 3.7–5.3)
SODIUM: 143 meq/L (ref 137–147)
Total Bilirubin: 0.3 mg/dL (ref 0.3–1.2)
Total Protein: 7.3 g/dL (ref 6.0–8.3)

## 2013-09-10 LAB — TYPE AND SCREEN
ABO/RH(D): B POS
Antibody Screen: NEGATIVE

## 2013-09-10 LAB — ABO/RH: ABO/RH(D): B POS

## 2013-09-10 LAB — URINALYSIS, ROUTINE W REFLEX MICROSCOPIC
BILIRUBIN URINE: NEGATIVE
GLUCOSE, UA: 250 mg/dL — AB
Hgb urine dipstick: NEGATIVE
KETONES UR: NEGATIVE mg/dL
Leukocytes, UA: NEGATIVE
Nitrite: NEGATIVE
PROTEIN: 100 mg/dL — AB
Specific Gravity, Urine: 1.012 (ref 1.005–1.030)
Urobilinogen, UA: 0.2 mg/dL (ref 0.0–1.0)
pH: 8.5 — ABNORMAL HIGH (ref 5.0–8.0)

## 2013-09-10 LAB — URINE MICROSCOPIC-ADD ON

## 2013-09-10 MED ORDER — ONDANSETRON 4 MG PO TBDP
8.0000 mg | ORAL_TABLET | Freq: Once | ORAL | Status: AC
  Administered 2013-09-10: 8 mg via ORAL
  Filled 2013-09-10: qty 2

## 2013-09-10 NOTE — ED Notes (Signed)
Pt states he has noticed black stools in his colostomy bag this evening.  Pt states he had some cauderized on the 6th.  Pt states some nausea but no vomiting.

## 2013-09-11 ENCOUNTER — Inpatient Hospital Stay (HOSPITAL_COMMUNITY)
Admission: EM | Admit: 2013-09-11 | Discharge: 2013-09-13 | DRG: 391 | Disposition: A | Discharge status: Home / Self Care | Payer: Medicare Other | Attending: Family Medicine | Admitting: Family Medicine

## 2013-09-11 DIAGNOSIS — E785 Hyperlipidemia, unspecified: Secondary | ICD-10-CM

## 2013-09-11 DIAGNOSIS — K219 Gastro-esophageal reflux disease without esophagitis: Secondary | ICD-10-CM

## 2013-09-11 DIAGNOSIS — I10 Essential (primary) hypertension: Secondary | ICD-10-CM

## 2013-09-11 DIAGNOSIS — N186 End stage renal disease: Secondary | ICD-10-CM

## 2013-09-11 DIAGNOSIS — R634 Abnormal weight loss: Secondary | ICD-10-CM

## 2013-09-11 DIAGNOSIS — I471 Supraventricular tachycardia: Secondary | ICD-10-CM

## 2013-09-11 DIAGNOSIS — N189 Chronic kidney disease, unspecified: Secondary | ICD-10-CM

## 2013-09-11 DIAGNOSIS — E875 Hyperkalemia: Secondary | ICD-10-CM

## 2013-09-11 DIAGNOSIS — Z9221 Personal history of antineoplastic chemotherapy: Secondary | ICD-10-CM

## 2013-09-11 DIAGNOSIS — E46 Unspecified protein-calorie malnutrition: Secondary | ICD-10-CM

## 2013-09-11 DIAGNOSIS — I350 Nonrheumatic aortic (valve) stenosis: Secondary | ICD-10-CM

## 2013-09-11 DIAGNOSIS — Z992 Dependence on renal dialysis: Secondary | ICD-10-CM

## 2013-09-11 DIAGNOSIS — I5032 Chronic diastolic (congestive) heart failure: Secondary | ICD-10-CM

## 2013-09-11 DIAGNOSIS — D631 Anemia in chronic kidney disease: Secondary | ICD-10-CM

## 2013-09-11 DIAGNOSIS — K922 Gastrointestinal hemorrhage, unspecified: Secondary | ICD-10-CM | POA: Diagnosis present

## 2013-09-11 DIAGNOSIS — Z888 Allergy status to other drugs, medicaments and biological substances status: Secondary | ICD-10-CM

## 2013-09-11 DIAGNOSIS — Z923 Personal history of irradiation: Secondary | ICD-10-CM

## 2013-09-11 DIAGNOSIS — E8809 Other disorders of plasma-protein metabolism, not elsewhere classified: Secondary | ICD-10-CM

## 2013-09-11 DIAGNOSIS — N185 Chronic kidney disease, stage 5: Secondary | ICD-10-CM

## 2013-09-11 LAB — BASIC METABOLIC PANEL
BUN: 57 mg/dL — AB (ref 6–23)
BUN: 55 mg/dL — ABNORMAL HIGH (ref 6–23)
CHLORIDE: 109 meq/L (ref 96–112)
CHLORIDE: 108 meq/L (ref 96–112)
CO2: 21 meq/L (ref 19–32)
CO2: 19 meq/L (ref 19–32)
CREATININE: 5.56 mg/dL — AB (ref 0.50–1.35)
CREATININE: 5.66 mg/dL — AB (ref 0.50–1.35)
Calcium: 9.8 mg/dL (ref 8.4–10.5)
Calcium: 9.5 mg/dL (ref 8.4–10.5)
GFR calc Af Amer: 11 mL/min — ABNORMAL LOW (ref >90)
GFR calc Af Amer: 10 mL/min — ABNORMAL LOW (ref >90)
GFR calc non Af Amer: 9 mL/min — ABNORMAL LOW (ref >90)
GFR calc non Af Amer: 9 mL/min — ABNORMAL LOW (ref >90)
GLUCOSE: 83 mg/dL (ref 70–99)
Glucose, Bld: 85 mg/dL (ref 70–99)
Potassium: 5.4 mEq/L — ABNORMAL HIGH (ref 3.7–5.3)
Potassium: 5.6 mEq/L — ABNORMAL HIGH (ref 3.7–5.3)
Sodium: 143 mEq/L (ref 137–147)
Sodium: 141 mEq/L (ref 137–147)

## 2013-09-11 LAB — CBC
HCT: 27.3 % — ABNORMAL LOW (ref 39.0–52.0)
HEMOGLOBIN: 8.6 g/dL — AB (ref 13.0–17.0)
MCH: 33.5 pg (ref 26.0–34.0)
MCHC: 31.5 g/dL (ref 30.0–36.0)
MCV: 106.2 fL — AB (ref 78.0–100.0)
Platelets: 192 10*3/uL (ref 150–400)
RBC: 2.57 MIL/uL — AB (ref 4.22–5.81)
RDW: 15.6 % — ABNORMAL HIGH (ref 11.5–15.5)
WBC: 5.8 10*3/uL (ref 4.0–10.5)

## 2013-09-11 LAB — HEMOGLOBIN: Hemoglobin: 9.5 g/dL — ABNORMAL LOW (ref 13.0–17.0)

## 2013-09-11 LAB — POC OCCULT BLOOD, ED: Fecal Occult Bld: POSITIVE — AB

## 2013-09-11 MED ORDER — ATORVASTATIN CALCIUM 40 MG PO TABS
40.0000 mg | ORAL_TABLET | Freq: Every day | ORAL | Status: DC
  Administered 2013-09-11 – 2013-09-13 (×3): 40 mg via ORAL
  Filled 2013-09-11 (×3): qty 1

## 2013-09-11 MED ORDER — SODIUM CHLORIDE 0.9 % IV SOLN: 100.0000 mL | INTRAVENOUS | Status: DC | PRN

## 2013-09-11 MED ORDER — LIDOCAINE HCL (PF) 1 % IJ SOLN: 5.0000 mL | INTRAMUSCULAR | Status: DC | PRN

## 2013-09-11 MED ORDER — PANTOPRAZOLE SODIUM 40 MG PO TBEC
40.0000 mg | DELAYED_RELEASE_TABLET | Freq: Two times a day (BID) | ORAL | Status: DC
  Administered 2013-09-11 – 2013-09-13 (×5): 40 mg via ORAL
  Filled 2013-09-11 (×5): qty 1

## 2013-09-11 MED ORDER — SODIUM CHLORIDE 0.9 % IV SOLN
INTRAVENOUS | Status: DC
  Administered 2013-09-11: 03:00:00 via INTRAVENOUS

## 2013-09-11 MED ORDER — SODIUM CHLORIDE 0.9 % IV SOLN
80.0000 mg | Freq: Once | INTRAVENOUS | Status: AC
  Administered 2013-09-11: 80 mg via INTRAVENOUS
  Filled 2013-09-11: qty 80

## 2013-09-11 MED ORDER — PENTAFLUOROPROP-TETRAFLUOROETH EX AERO: 1.0000 "application " | INHALATION_SPRAY | CUTANEOUS | Status: DC | PRN

## 2013-09-11 MED ORDER — METOPROLOL TARTRATE 25 MG PO TABS
25.0000 mg | ORAL_TABLET | Freq: Every day | ORAL | Status: DC
  Administered 2013-09-11 – 2013-09-13 (×3): 25 mg via ORAL
  Filled 2013-09-11 (×3): qty 1

## 2013-09-11 MED ORDER — SUCRALFATE 1 GM/10ML PO SUSP
1.0000 g | Freq: Every evening | ORAL | Status: DC | PRN
  Filled 2013-09-11: qty 10

## 2013-09-11 MED ORDER — INSULIN ASPART 100 UNIT/ML IV SOLN: 7.0000 [IU] | Freq: Once | INTRAVENOUS | Status: DC

## 2013-09-11 MED ORDER — SUCRALFATE 1 GM/10ML PO SUSP
1.0000 g | Freq: Two times a day (BID) | ORAL | Status: DC
  Administered 2013-09-11 – 2013-09-12 (×2): 1 g via ORAL
  Filled 2013-09-11 (×5): qty 10

## 2013-09-11 MED ORDER — TETRAHYDROZOLINE HCL 0.05 % OP SOLN
1.0000 [drp] | Freq: Two times a day (BID) | OPHTHALMIC | Status: DC
  Administered 2013-09-11 – 2013-09-13 (×4): 1 [drp] via OPHTHALMIC
  Filled 2013-09-11: qty 15

## 2013-09-11 MED ORDER — ALTEPLASE 2 MG IJ SOLR
2.0000 mg | Freq: Once | INTRAMUSCULAR | Status: DC | PRN
  Filled 2013-09-11: qty 2

## 2013-09-11 MED ORDER — SODIUM CHLORIDE 0.9 % IV SOLN
1.0000 g | Freq: Once | INTRAVENOUS | Status: AC
  Administered 2013-09-11: 1 g via INTRAVENOUS
  Filled 2013-09-11: qty 10

## 2013-09-11 MED ORDER — NEPRO/CARBSTEADY PO LIQD
237.0000 mL | ORAL | Status: DC | PRN
  Filled 2013-09-11: qty 237

## 2013-09-11 MED ORDER — LIDOCAINE-PRILOCAINE 2.5-2.5 % EX CREA
1.0000 "application " | TOPICAL_CREAM | CUTANEOUS | Status: DC | PRN
  Filled 2013-09-11: qty 5

## 2013-09-11 MED ORDER — SEVELAMER CARBONATE 800 MG PO TABS
2400.0000 mg | ORAL_TABLET | Freq: Three times a day (TID) | ORAL | Status: DC
  Administered 2013-09-11 – 2013-09-13 (×6): 2400 mg via ORAL
  Filled 2013-09-11 (×10): qty 3

## 2013-09-11 MED ORDER — SODIUM CHLORIDE 0.9 % IJ SOLN
3.0000 mL | Freq: Two times a day (BID) | INTRAMUSCULAR | Status: DC
  Administered 2013-09-11 – 2013-09-13 (×4): 3 mL via INTRAVENOUS

## 2013-09-11 MED ORDER — DEXTROSE-NACL 5-0.9 % IV SOLN: Freq: Once | INTRAVENOUS | Status: DC

## 2013-09-11 MED ORDER — ONDANSETRON HCL 4 MG PO TABS: 8.0000 mg | ORAL_TABLET | Freq: Two times a day (BID) | ORAL | Status: DC | PRN

## 2013-09-11 MED ORDER — METOPROLOL TARTRATE 25 MG PO TABS: 25.0000 mg | ORAL_TABLET | ORAL | Status: DC

## 2013-09-11 MED ORDER — DEXTROSE 50 % IV SOLN
50.0000 mL | Freq: Once | INTRAVENOUS | Status: AC
  Administered 2013-09-11: 50 mL via INTRAVENOUS
  Filled 2013-09-11: qty 50

## 2013-09-11 MED ORDER — IPRATROPIUM-ALBUTEROL 0.5-2.5 (3) MG/3ML IN SOLN: 3.0000 mL | Freq: Two times a day (BID) | RESPIRATORY_TRACT | Status: DC | PRN

## 2013-09-11 MED ORDER — SEVELAMER CARBONATE 800 MG PO TABS
800.0000 mg | ORAL_TABLET | ORAL | Status: DC | PRN
  Filled 2013-09-11: qty 2

## 2013-09-11 MED ORDER — AMLODIPINE BESYLATE 2.5 MG PO TABS
2.5000 mg | ORAL_TABLET | Freq: Every day | ORAL | Status: DC
  Administered 2013-09-11 – 2013-09-13 (×3): 2.5 mg via ORAL
  Filled 2013-09-11 (×3): qty 1

## 2013-09-11 MED ORDER — SODIUM POLYSTYRENE SULFONATE 15 GM/60ML PO SUSP
30.0000 g | Freq: Once | ORAL | Status: AC
  Administered 2013-09-11: 30 g via ORAL
  Filled 2013-09-11: qty 120

## 2013-09-11 MED ORDER — INSULIN ASPART 100 UNIT/ML ~~LOC~~ SOLN
7.0000 [IU] | Freq: Once | SUBCUTANEOUS | Status: AC
  Administered 2013-09-11: 7 [IU] via INTRAVENOUS
  Filled 2013-09-11: qty 1

## 2013-09-11 NOTE — Care Management Note (Unsigned)
    Page 1 of 1   09/11/2013     3:57:14 PM   CARE MANAGEMENT NOTE 09/11/2013  Patient:  Gerald Cooke, Gerald Cooke   Account Number:  [id number]  Date Initiated:  09/11/2013  Documentation initiated by:  Jamelia Varano  Subjective/Objective Assessment:   PT ADM ON 2/19 WITH GIB, HYPERKALEMIA.  PTA, PT INDEPENDENT OF ADLS.     Action/Plan:   WILL FOLLOW FOR DISCHARGE NEEDS AS PT PROGRESSES.   Anticipated DC Date:  09/13/2013   Anticipated DC Plan:  [place]  CM consult      Choice offered to / List presented to:             Status of service:  In process, will continue to follow Medicare Important Message given?   (If response is "NO", the following Medicare IM given date fields will be blank) Date Medicare IM given:   Date Additional Medicare IM given:    Discharge Disposition:    Per UR Regulation:  Reviewed for med. necessity/level of care/duration of stay  If discussed at [place] of Stay Meetings, dates discussed:    Comments:

## 2013-09-11 NOTE — ED Provider Notes (Signed)
CSN: [id number]     Arrival date & time 09/10/13  2150 History   First MD Initiated Contact with Patient 09/11/13 0013     Chief Complaint  Patient presents with  . Melena     (Consider location/radiation/quality/duration/timing/severity/associated sxs/prior Treatment) HPI Patient is a very pleasant 73 yo man with a history of recent GIB secondary to gastric ulcer. He presents with approx 12 hr of melanotic stools in colostomy bag. He has emptied his bag of melanotic stool 3-4 times today. He denies abdominal pain, nausea and vomiting. The patient is not anticoagulated. He has felt a little more weak than normal but denies feeling of near syncope. Denies sob and chest pain.   Patient is a hemodialysis patient and received H/D on on M/W/F. He was last dialyzed 2d ago on time.    Past Medical History  Diagnosis Date  . Colostomy in place   . Abdominal aortic aneurysm 02/2007    Ruptured, hospitalized at [facility] for 52 days, c/b loss of colon and renal function. Followed by Dr Ronalee Belts  . Hyperlipidemia   . Atherosclerosis   . Esophageal reflux   . Hypertension   . PSVT (paroxysmal supraventricular tachycardia)   . Aortic stenosis   . Lung cancer 2009    Initial clinical stage IIIB - treated with radiation + chemotherapy  . Heart murmur   . ESRD (end stage renal disease) on dialysis     "[facility]; [place]; [place], Wed & Fri,  Dr Holley Raring" (08/24/2013)  . Exertional shortness of breath   . Anemia   . History of blood transfusion 2008    "related to ruptured AAA" (08/24/2013)  . Arthritis     "fingers"   . GERD (gastroesophageal reflux disease)   . Chronic diastolic congestive heart failure   . S/P radiation therapy > 12 wks ago   . S/P chemotherapy, time since greater than 12 weeks   . 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 Hypoalbuminemia   . Anemia in chronic kidney disease   . GI (gastrointestinal bleed)   . Heparin allergy   . Non-STEMI (non-ST elevated myocardial infarction)   .  Malnutrition    Past Surgical History  Procedure Laterality Date  . Colostomy  03/17/2007    end colostomy of right colon near hepatic flexure  . Left colectomy  03/17/2007    extended left colectomy and proctectomy for ischemic colitis after emergency repair of ruptured AAA  . Abdominal aortic aneurysm repair  03/14/2007    [facility]:  emergency repair for ruptured AAA  . Arteriovenous graft placement Left 2008    "arm"  . Pilonidal cyst / sinus excision    . Cataract extraction w/ intraocular lens implant Right   . Esophagogastroduodenoscopy Left 08/27/2013    Procedure: ESOPHAGOGASTRODUODENOSCOPY (EGD);  Surgeon: Arta Silence, MD;  Location: [facility] ENDOSCOPY;  Service: Endoscopy;  Laterality: Left;   No family history on file. History  Substance Use Topics  . Smoking status: Former Smoker -- 2.00 packs/day for 25 years    Types: Cigarettes  . Smokeless tobacco: Never Used     Comment: Quit in 1990's  . Alcohol Use: Yes     Comment: 08/24/2013 "might have a drink couple times/yr"    Review of Systems Ten point review of symptoms performed and is negative with the exception of symptoms noted above.     Allergies  Heparin; Shellfish allergy; and Tetanus toxoids  Home Medications   Current Outpatient Rx  Name  Route  Sig  Dispense  Refill  . amLODipine (NORVASC) 2.5 MG tablet   Oral   Take 1 tablet (2.5 mg total) by mouth daily.   30 tablet   6   . aspirin EC 81 MG tablet   Oral   Take 81 mg by mouth daily.         Marland Kitchen atorvastatin (LIPITOR) 40 MG tablet   Oral   Take 40 mg by mouth daily.          . Ipratropium-Albuterol (COMBIVENT RESPIMAT) 20-100 MCG/ACT AERS respimat   Inhalation   Inhale 1 puff into the lungs 2 (two) times daily as needed for wheezing or shortness of breath.          . lactose free nutrition (BOOST) LIQD   Oral   Take 237 mLs by mouth daily as needed (protein supplement).         . metoprolol tartrate (LOPRESSOR) 25 MG tablet   Oral    Take 25 mg by mouth See admin instructions. Take 1 tablet (25 mg) every morning, may take an additional tablet in the evening if blood pressure is over 160         . ondansetron (ZOFRAN) 8 MG tablet   Oral   Take 8 mg by mouth 2 (two) times daily as needed for nausea or vomiting.         . pantoprazole (PROTONIX) 40 MG tablet   Oral   Take 1 tablet (40 mg total) by mouth 2 (two) times daily.   60 tablet   1   . sevelamer (RENVELA) 800 MG tablet   Oral   Take 800-2,400 mg by mouth See admin instructions. Take 1-2 tablets ([phone] mg) with snacks, and 3 tablets (2400 mg) with meals         . sucralfate (CARAFATE) 1 GM/10ML suspension   Oral   Take 1 g by mouth See admin instructions. Take 10 mls (2 grams) twice daily, may take an additional dose at bedtime if needed for acid indigestion         . Tetrahydrozoline HCl (VISINE OP)   Both Eyes   Place 1 drop into both eyes 2 (two) times daily as needed (blurriness/ dry eyes).          BP 140/66  Pulse 88  Temp(Src) 98 F (36.7 C) (Oral)  Resp 24  SpO2 99% Physical Exam Gen: well developed and well nourished appearing Head: NCAT Eyes: PERL, EOMI Nose: no epistaixis or rhinorrhea Mouth/throat: mucosa is moist and pink Neck: supple, no stridor Lungs: CTA B, no wheezing, rhonchi or rales CV: RRR, no murmur, extremities appear well perfused.  Abd: soft, notender, nondistended, ostomy in place, no stool in bag Back: no ttp, no cva ttp Skin: warm and dry Ext: normal to inspection, no dependent edema Neuro: CN ii-xii grossly intact, no focal deficits Psyche; normal affect,  calm and cooperative.   ED Course  Procedures (including critical care time) Labs Review  Results for orders placed during the hospital encounter of 09/11/13 (from the past 24 hour(s))  URINALYSIS, ROUTINE W REFLEX MICROSCOPIC     Status: Abnormal   Collection Time    09/10/13 10:16 PM      Result Value Ref Range   Color, Urine YELLOW  YELLOW    APPearance CLEAR  CLEAR   Specific Gravity, Urine 1.012  1.005 - 1.030   pH 8.5 (*) 5.0 - 8.0   Glucose, UA 250 (*) NEGATIVE mg/dL  Hgb urine dipstick NEGATIVE  NEGATIVE   Bilirubin Urine NEGATIVE  NEGATIVE   Ketones, ur NEGATIVE  NEGATIVE mg/dL   Protein, ur 100 (*) NEGATIVE mg/dL   Urobilinogen, UA 0.2  0.0 - 1.0 mg/dL   Nitrite NEGATIVE  NEGATIVE   Leukocytes, UA NEGATIVE  NEGATIVE  URINE MICROSCOPIC-ADD ON     Status: None   Collection Time    09/10/13 10:16 PM      Result Value Ref Range   Squamous Epithelial / LPF RARE  RARE   Bacteria, UA RARE  RARE  CBC WITH DIFFERENTIAL     Status: Abnormal   Collection Time    09/10/13 10:24 PM      Result Value Ref Range   WBC 8.0  4.0 - 10.5 K/uL   RBC 3.02 (*) 4.22 - 5.81 MIL/uL   Hemoglobin 10.3 (*) 13.0 - 17.0 g/dL   HCT 32.2 (*) 39.0 - 52.0 %   MCV 106.6 (*) 78.0 - 100.0 fL   MCH 34.1 (*) 26.0 - 34.0 pg   MCHC 32.0  30.0 - 36.0 g/dL   RDW 15.7 (*) 11.5 - 15.5 %   Platelets 219  150 - 400 K/uL   Neutrophils Relative % 79 (*) 43 - 77 %   Neutro Abs 6.3  1.7 - 7.7 K/uL   Lymphocytes Relative 12  12 - 46 %   Lymphs Abs 1.0  0.7 - 4.0 K/uL   Monocytes Relative 6  3 - 12 %   Monocytes Absolute 0.5  0.1 - 1.0 K/uL   Eosinophils Relative 3  0 - 5 %   Eosinophils Absolute 0.3  0.0 - 0.7 K/uL   Basophils Relative 0  0 - 1 %   Basophils Absolute 0.0  0.0 - 0.1 K/uL  COMPREHENSIVE METABOLIC PANEL     Status: Abnormal   Collection Time    09/10/13 10:24 PM      Result Value Ref Range   Sodium 143  137 - 147 mEq/L   Potassium 6.7 (*) 3.7 - 5.3 mEq/L   Chloride 105  96 - 112 mEq/L   CO2 23  19 - 32 mEq/L   Glucose, Bld 96  70 - 99 mg/dL   BUN 54 (*) 6 - 23 mg/dL   Creatinine, Ser 5.28 (*) 0.50 - 1.35 mg/dL   Calcium 10.1  8.4 - 10.5 mg/dL   Total Protein 7.3  6.0 - 8.3 g/dL   Albumin 3.4 (*) 3.5 - 5.2 g/dL   AST 11  0 - 37 U/L   ALT 9  0 - 53 U/L   Alkaline Phosphatase 80  39 - 117 U/L   Total Bilirubin 0.3  0.3 - 1.2  mg/dL   GFR calc non Af Amer 10 (*) >90 mL/min   GFR calc Af Amer 11 (*) >90 mL/min  TYPE AND SCREEN     Status: None   Collection Time    09/10/13 10:24 PM      Result Value Ref Range   ABO/RH(D) B POS     Antibody Screen NEG     Sample Expiration 09/13/2013    ABO/RH     Status: None   Collection Time    09/10/13 10:24 PM      Result Value Ref Range   ABO/RH(D) B POS     EKG: nsr, pulse 87 bpm,  no acute ischemic changes, normal intervals, 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 We are treating with insulin/D50 and calcium.   Stool is heme positive. Chart review shows that the patient actually was not found to have a gastric ulcer by 08/27/13 EGD.   The patient is hemodynamically stable. Will admit to IM for serial H/H. Patient will need hemodialysis later in the AM. Will consult Nephrology to advise.   Elyn Peers, MD 09/11/13 [phone]

## 2013-09-11 NOTE — ED Notes (Signed)
EKG given to Dr. Cheri Guppy.

## 2013-09-11 NOTE — Consult Note (Signed)
[facility] Renal Consultation Note    Indication for Consultation:  Management of ESRD/hemodialysis; anemia, hypertension/volume and secondary hyperparathyroidism  HPI: Gerald Cooke is a 73 y.o. male.  Pt is a 73yo WM with PMH sig for HTN, AS, h/o AAA rupture, lung Ca s/p chemo and radiation, GERD, ESRD on HD qMWF at [facility], and recent discharge 2 weeks ago due to GIB from radiation esophagitis who presented again to [facility] with 3-4 episodes of melena in his ostomy bag.  He was admitted for further evaluation and was noted to have hyeprkalemia with K of 6.7.  We were asked to help manage his HD and ESRD-related medical issues.  Pt currently feeling slightly weaker than usual but not light headed or dizzy   Past Medical History  Diagnosis Date  . Colostomy in place   . Abdominal aortic aneurysm 02/2007    Ruptured, hospitalized at [facility] for 52 days, c/b loss of colon and renal function. Followed by Dr Ronalee Belts  . Hyperlipidemia   . Atherosclerosis   . Esophageal reflux   . Hypertension   . PSVT (paroxysmal supraventricular tachycardia)   . Aortic stenosis   . Lung cancer 2009    Initial clinical stage IIIB - treated with radiation + chemotherapy  . Heart murmur   . ESRD (end stage renal disease) on dialysis     "[facility]; [place]; [place], Wed & Fri,  Dr Holley Raring" (08/24/2013)  . Exertional shortness of breath   . Anemia   . History of blood transfusion 2008    "related to ruptured AAA" (08/24/2013)  . Arthritis     "fingers"   . GERD (gastroesophageal reflux disease)   . Chronic diastolic congestive heart failure   . S/P radiation therapy > 12 wks ago   . S/P chemotherapy, time since greater than 12 weeks   . 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 Hypoalbuminemia   . Anemia in chronic kidney disease   . GI (gastrointestinal bleed)   . Heparin allergy   . Non-STEMI (non-ST elevated myocardial infarction)   . Malnutrition    Past Surgical History  Procedure  Laterality Date  . Colostomy  03/17/2007    end colostomy of right colon near hepatic flexure  . Left colectomy  03/17/2007    extended left colectomy and proctectomy for ischemic colitis after emergency repair of ruptured AAA  . Abdominal aortic aneurysm repair  03/14/2007    [facility]:  emergency repair for ruptured AAA  . Arteriovenous graft placement Left 2008    "arm"  . Pilonidal cyst / sinus excision    . Cataract extraction w/ intraocular lens implant Right   . Esophagogastroduodenoscopy Left 08/27/2013    Procedure: ESOPHAGOGASTRODUODENOSCOPY (EGD);  Surgeon: Arta Silence, MD;  Location: [facility] ENDOSCOPY;  Service: Endoscopy;  Laterality: Left;   Family History:   No family history on file. Social History:  reports that he has quit smoking. His smoking use included Cigarettes. He has a 50 pack-year smoking history. He has never used smokeless tobacco. He reports that he drinks alcohol. He reports that he does not use illicit drugs. Allergies  Allergen Reactions  . 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 Shellfish Allergy Other (See Comments)    Throat swelled/ choking  . Tetanus Toxoids Swelling    Arm swelled up  Prior to Admission medications   Medication Sig Start Date End Date Taking? Authorizing Provider  amLODipine (NORVASC) 2.5 MG tablet Take 1 tablet (2.5 mg total) by mouth daily. 08/29/13  Yes Cecilie Kicks, NP  aspirin EC 81 MG tablet Take 81 mg by mouth daily.   Yes Historical Provider, MD  atorvastatin (LIPITOR) 40 MG tablet Take 40 mg by mouth daily.  06/04/13  Yes Historical Provider, MD  Ipratropium-Albuterol (COMBIVENT RESPIMAT) 20-100 MCG/ACT AERS respimat Inhale 1 puff into the lungs 2 (two) times daily as needed for wheezing or shortness of breath.    Yes Historical Provider, MD  lactose free nutrition  (BOOST) LIQD Take 237 mLs by mouth daily as needed (protein supplement).   Yes Historical Provider, MD  metoprolol tartrate (LOPRESSOR) 25 MG tablet Take 25 mg by mouth See admin instructions. Take 1 tablet (25 mg) every morning, may take an additional tablet in the evening if blood pressure is over 160 09/23/12  Yes Wellington Hampshire, MD  ondansetron (ZOFRAN) 8 MG tablet Take 8 mg by mouth 2 (two) times daily as needed for nausea or vomiting.   Yes Historical Provider, MD  pantoprazole (PROTONIX) 40 MG tablet Take 1 tablet (40 mg total) by mouth 2 (two) times daily. 08/29/13  Yes Cecilie Kicks, NP  sevelamer (RENVELA) 800 MG tablet Take 800-2,400 mg by mouth See admin instructions. Take 1-2 tablets [phone] mg) with snacks, and 3 tablets (2400 mg) with meals   Yes Historical Provider, MD  sucralfate (CARAFATE) 1 GM/10ML suspension Take 1 g by mouth See admin instructions. Take 10 mls (2 grams) twice daily, may take an additional dose at bedtime if needed for acid indigestion   Yes Historical Provider, MD  Tetrahydrozoline HCl (VISINE OP) Place 1 drop into both eyes 2 (two) times daily as needed (blurriness/ dry eyes).   Yes Historical Provider, MD   Current Facility-Administered Medications  Medication Dose Route Frequency Provider Last Rate Last Dose  . 0.9 %  sodium chloride infusion   Intravenous Continuous Etta Quill, DO 75 mL/hr at 09/11/13 0255    . 0.9 %  sodium chloride infusion  100 mL Intravenous PRN Jay K. Posey Pronto, MD      . 0.9 %  sodium chloride infusion  100 mL Intravenous PRN Ulice Dash K. Posey Pronto, MD      . alteplase (CATHFLO ACTIVASE) injection 2 mg  2 mg Intracatheter Once PRN Ulice Dash K. Posey Pronto, MD      . amLODipine [facility]) tablet 2.5 mg  2.5 mg Oral Daily Etta Quill, DO      . atorvastatin (LIPITOR) tablet 40 mg  40 mg Oral Daily Etta Quill, DO      . feeding supplement (NEPRO CARB STEADY) liquid 237 mL  237 mL Oral PRN Clayborne Dana. Posey Pronto, MD      . ipratropium-albuterol (DUONEB) 0.5-2.5  (3) MG/3ML nebulizer solution 3 mL  3 mL Inhalation BID PRN Etta Quill, DO      . lidocaine (PF) (XYLOCAINE) 1 % injection 5 mL  5 mL Intradermal PRN Clayborne Dana. Posey Pronto, MD      . lidocaine-prilocaine (EMLA) cream 1 application  1 application Topical PRN Ulice Dash K. Posey Pronto, MD      . metoprolol tartrate (LOPRESSOR) tablet 25 mg  25 mg Oral Daily Etta Quill, DO      . ondansetron [facility]) tablet 8 mg  8 mg Oral BID PRN Etta Quill, DO      .  pantoprazole (PROTONIX) EC tablet 40 mg  40 mg Oral BID Etta Quill, DO      . pentafluoroprop-tetrafluoroeth (GEBAUERS) aerosol 1 application  1 application Topical PRN Clayborne Dana. Posey Pronto, MD      . sevelamer carbonate (RENVELA) tablet 2,400 mg  2,400 mg Oral TID WC Etta Quill, DO      . sevelamer carbonate (RENVELA) tablet 800-1,600 mg  800-1,600 mg Oral PRN Etta Quill, DO      . sodium chloride 0.9 % injection 3 mL  3 mL Intravenous Q12H Etta Quill, DO   3 mL at 09/11/13 0230  . sucralfate (CARAFATE) 1 GM/10ML suspension 1 g  1 g Oral BID WC Etta Quill, DO      . sucralfate (CARAFATE) 1 GM/10ML suspension 1 g  1 g Oral QHS PRN Etta Quill, DO      . tetrahydrozoline 0.05 % ophthalmic solution 1 drop  1 drop Both Eyes BID Etta Quill, DO       Labs: Basic Metabolic Panel:  Recent Labs Lab 09/10/13 2224 09/11/13 0530 09/11/13 0851  NA 143 143 141  K 6.7* 5.4* 5.6*  CL 105 109 108  CO2 23 21 19   GLUCOSE 96 83 85  BUN 54* 57* 55*  CREATININE 5.28* 5.56* 5.66*  CALCIUM 10.1 9.8 9.5   Liver Function Tests:  Recent Labs Lab 09/10/13 2224  AST 11  ALT 9  ALKPHOS 80  BILITOT 0.3  PROT 7.3  ALBUMIN 3.4*   No results found for this basename: LIPASE, AMYLASE,  in the last 168 hours No results found for this basename: AMMONIA,  in the last 168 hours CBC:  Recent Labs Lab 09/10/13 2224 09/11/13 0530  WBC 8.0 5.8  NEUTROABS 6.3  --   HGB 10.3* 8.6*  HCT 32.2* 27.3*  MCV 106.6* 106.2*  PLT 219 192   Cardiac  Enzymes: No results found for this basename: CKTOTAL, CKMB, CKMBINDEX, TROPONINI,  in the last 168 hours CBG: No results found for this basename: GLUCAP,  in the last 168 hours Iron Studies: No results found for this basename: IRON, TIBC, TRANSFERRIN, FERRITIN,  in the last 72 hours Studies/Results: No results found.  ROS: Pertinent items are noted in HPI. Physical Exam: Filed Vitals:   09/11/13 0900 09/11/13 0930 09/11/13 1000 09/11/13 1030  BP: 99/51 97/49 91/49  93/48  Pulse: 85 96 115 107  Temp:      TempSrc:      Resp:      Height:      Weight:      SpO2:          Weight change:   Intake/Output Summary (Last 24 hours) at 09/11/13 1040 Last data filed at 09/11/13 0600  Gross per 24 hour  Intake 231.25 ml  Output      0 ml  Net 231.25 ml   BP 93/48  Pulse 107  Temp(Src) 97.9 F (36.6 C) (Oral)  Resp 16  Ht 5\' 6"  (1.676 m)  Wt 60.9 kg (134 lb 4.2 oz)  BMI 21.68 kg/m2  SpO2 97% General appearance: alert, cooperative, no distress and pale Head: Normocephalic, without obvious abnormality, atraumatic Resp: clear to auscultation bilaterally Cardio: no rub, III/VI SEM at LUSB and around precordium GI: +BS, soft, ostomy in place Extremities: no edema, LUE AVG +T/B  Dialysis Orders: Center: [facility]  on MWF . EDW 60.5kg HD Bath 2K/2Ca  Time 3hr Heparin none. Access LUE AVG BFR  450 DFR 800    Hectoral 40mcg mcg IV/HD Epogen 8200  Units IV/HD     Assessment/Plan: 1.  Melena- per GI 2.  ESRD -  Plan for urgent HD given hyperkalemia, no heparin due to ongoing GIB 3.  Hypertension/volume  - stable 4.  Anemia  - cont with EPO and transfuse prn 5.  Metabolic bone disease -  Follow Ca/Phos/iPTH 6.  Nutrition - per GI 7. Hyerkalemia- follow after HD 8. Aortic Stenosis- for TAVR in 1 month after esophagitis resolves per Drs. Cooper and [employer]   @[email or web address] @ 09/11/2013, 10:40 AM

## 2013-09-11 NOTE — H&P (Signed)
[facility] History and Physical  MARQUIZE SEIB TKW:[id number] DOB: 12/13/1940 DOA: 09/11/2013  Referring physician: EDP PCP: Rica Mast, MD   Chief Complaint: Melena   HPI: Gerald Cooke is a 73 y.o. 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  Patient also has hyperkalemia of 6.7 in setting of ESRD (dialysis M/W/F, last session 2 days ago).  ESRD occurs in the context of loss of colon and renal function following survival of AAA rupture in 2008 and resulting 52 day hospital stay.  Today patient noted 3-4 episodes of melena in his ostomy bag and so presented to the ED.  Feeling slightly weaker than usual but not light headed or dizzy.  Review of Systems: Systems reviewed.  As above, otherwise negative  Past Medical History  Diagnosis Date  . Colostomy in place   . Abdominal aortic aneurysm 02/2007    Ruptured, hospitalized at [facility] for 52 days, c/b loss of colon and renal function. Followed by Dr Ronalee Belts  . Hyperlipidemia   . Atherosclerosis   . Esophageal reflux   . Hypertension   . PSVT (paroxysmal supraventricular tachycardia)   . Aortic stenosis   . Lung cancer 2009    Initial clinical stage IIIB - treated with radiation + chemotherapy  . Heart murmur   . ESRD (end stage renal disease) on dialysis     "[facility]; [place]; [place], Wed & Fri,  Dr Holley Raring" (08/24/2013)  . Exertional shortness of breath   . Anemia   . History of blood transfusion 2008    "related to ruptured AAA" (08/24/2013)  . Arthritis     "fingers"   . GERD (gastroesophageal reflux disease)   . Chronic diastolic congestive heart failure   . S/P radiation therapy > 12 wks ago   . S/P chemotherapy, time since greater than 12 weeks   . 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 Hypoalbuminemia   . Anemia in chronic kidney disease   . GI (gastrointestinal bleed)   . Heparin allergy   .  Non-STEMI (non-ST elevated myocardial infarction)   . Malnutrition    Past Surgical History  Procedure Laterality Date  . Colostomy  03/17/2007    end colostomy of right colon near hepatic flexure  . Left colectomy  03/17/2007    extended left colectomy and proctectomy for ischemic colitis after emergency repair of ruptured AAA  . Abdominal aortic aneurysm repair  03/14/2007    [facility]:  emergency repair for ruptured AAA  . Arteriovenous graft placement Left 2008    "arm"  . Pilonidal cyst / sinus excision    . Cataract extraction w/ intraocular lens implant Right   . Esophagogastroduodenoscopy Left 08/27/2013    Procedure: ESOPHAGOGASTRODUODENOSCOPY (EGD);  Surgeon: Arta Silence, MD;  Location: [facility] ENDOSCOPY;  Service: Endoscopy;  Laterality: Left;   Social History:  reports that he has quit smoking. His smoking use included Cigarettes. He has a 50 pack-year smoking history. He has never used smokeless tobacco. He reports that he drinks alcohol. He reports that he does not use illicit drugs.  Allergies  Allergen Reactions  . 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 Shellfish Allergy Other (See Comments)    Throat swelled/ choking  .  Tetanus Toxoids Swelling    Arm swelled up    No family history on file.   Prior to Admission medications   Medication Sig Start Date End Date Taking? Authorizing Provider  amLODipine (NORVASC) 2.5 MG tablet Take 1 tablet (2.5 mg total) by mouth daily. 08/29/13  Yes Cecilie Kicks, NP  aspirin EC 81 MG tablet Take 81 mg by mouth daily.   Yes Historical Provider, MD  atorvastatin (LIPITOR) 40 MG tablet Take 40 mg by mouth daily.  06/04/13  Yes Historical Provider, MD  Ipratropium-Albuterol (COMBIVENT RESPIMAT) 20-100 MCG/ACT AERS respimat Inhale 1 puff into the lungs 2 (two) times daily as needed  for wheezing or shortness of breath.    Yes Historical Provider, MD  lactose free nutrition (BOOST) LIQD Take 237 mLs by mouth daily as needed (protein supplement).   Yes Historical Provider, MD  metoprolol tartrate (LOPRESSOR) 25 MG tablet Take 25 mg by mouth See admin instructions. Take 1 tablet (25 mg) every morning, may take an additional tablet in the evening if blood pressure is over 160 09/23/12  Yes Wellington Hampshire, MD  ondansetron (ZOFRAN) 8 MG tablet Take 8 mg by mouth 2 (two) times daily as needed for nausea or vomiting.   Yes Historical Provider, MD  pantoprazole (PROTONIX) 40 MG tablet Take 1 tablet (40 mg total) by mouth 2 (two) times daily. 08/29/13  Yes Cecilie Kicks, NP  sevelamer (RENVELA) 800 MG tablet Take 800-2,400 mg by mouth See admin instructions. Take 1-2 tablets ([phone] mg) with snacks, and 3 tablets (2400 mg) with meals   Yes Historical Provider, MD  sucralfate (CARAFATE) 1 GM/10ML suspension Take 1 g by mouth See admin instructions. Take 10 mls (2 grams) twice daily, may take an additional dose at bedtime if needed for acid indigestion   Yes Historical Provider, MD  Tetrahydrozoline HCl (VISINE OP) Place 1 drop into both eyes 2 (two) times daily as needed (blurriness/ dry eyes).   Yes Historical Provider, MD   Physical Exam: Filed Vitals:   09/11/13 0034  BP: 140/66  Pulse:   Temp: 98 F (36.7 C)  Resp: 24    BP 140/66  Pulse 88  Temp(Src) 98 F (36.7 C) (Oral)  Resp 24  SpO2 99%  General Appearance:    Alert, oriented, no distress, appears stated age  Head:    Normocephalic, atraumatic  Eyes:    PERRL, EOMI, sclera non-icteric        Nose:   Nares without drainage or epistaxis. Mucosa, turbinates normal  Throat:   Moist mucous membranes. Oropharynx without erythema or exudate.  Neck:   Supple. No carotid bruits.  No thyromegaly.  No lymphadenopathy.   Back:     No CVA tenderness, no spinal tenderness  Lungs:     Clear to auscultation bilaterally, without  wheezes, rhonchi or rales  Chest wall:    No tenderness to palpitation  Heart:    Regular rate and rhythm without murmurs, gallops, rubs  Abdomen:     Soft, non-tender, nondistended, normal bowel sounds, no organomegaly, ostomy bag with melena  Genitalia:    deferred  Rectal:    deferred  Extremities:   No clubbing, cyanosis or edema.  Pulses:   2+ and symmetric all extremities  Skin:   Skin color, texture, turgor normal, no rashes or lesions  Lymph nodes:   Cervical, supraclavicular, and axillary nodes normal  Neurologic:   CNII-XII intact. Normal strength, sensation and reflexes  throughout    Labs on Admission:  Basic Metabolic Panel:  Recent Labs Lab 09/10/13 2224  NA 143  K 6.7*  CL 105  CO2 23  GLUCOSE 96  BUN 54*  CREATININE 5.28*  CALCIUM 10.1   Liver Function Tests:  Recent Labs Lab 09/10/13 2224  AST 11  ALT 9  ALKPHOS 80  BILITOT 0.3  PROT 7.3  ALBUMIN 3.4*   No results found for this basename: LIPASE, AMYLASE,  in the last 168 hours No results found for this basename: AMMONIA,  in the last 168 hours CBC:  Recent Labs Lab 09/10/13 2224  WBC 8.0  NEUTROABS 6.3  HGB 10.3*  HCT 32.2*  MCV 106.6*  PLT 219   Cardiac Enzymes: No results found for this basename: CKTOTAL, CKMB, CKMBINDEX, TROPONINI,  in the last 168 hours  BNP (last 3 results)  Recent Labs  08/25/13 0305  PROBNP 42409.0*   CBG: No results found for this basename: GLUCAP,  in the last 168 hours  Radiological Exams on Admission: No results found.  EKG: Independently reviewed.  Assessment/Plan Principal Problem:   UGIB (upper gastrointestinal bleed) Active Problems:   S/P radiation therapy > 12 wks ago   ESRD (end stage renal disease) on dialysis, MWF   Hyperkalemia, diminished renal excretion   1. 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 2. Hyperkalemia in setting of ESRD - likely due to potassium absorption associated with GIB, on temporary measures and getting dialysis in a couple of hours, monitor potassium levels while here.    Code Status: Full Code  Family Communication: Family at bedside Disposition Plan: Admit to inpatient   Time spent: 70 min  Burr Soffer M. [facility] Pager [phone]  If 7AM-7PM, please contact the day team taking care of the patient [email or web address] Password [email or web address] 09/11/2013, 2:30 AM

## 2013-09-11 NOTE — Progress Notes (Signed)
Brief accept note:   I have reviewed Dr. Juleen China note and evaluated Gerald Cooke.   Briefly, Gerald Cooke is a 73yo man with PMH of lung cancer s/p treatment in 2009, recent admission for UGIB thought to be due to radiation esophagitis and ESRD who presented with melena in ostomy bag, weakness and hyperkalemia.  Gerald Cooke reports to me that he just feels week and is hungry.  He is NPO due to melena.  He was seen in dialysis and tolerating the procedure well.  Ostomy bag had small amount of green stool in the bag, no apparent blood, stoma looked pink and without infection.  Most recent hgb was 8.6 which is close to his recent discharge level.  Plan for repeat Hgb at noon, if continuing to drop will consider transfusion and GI consult for possible intervention (althought EGD may not be useful given that we know about the radiation esophagitis.  He is on PPI with protonix.  He is currently NPO.  If H/H stabilize, will consider advancing diet.    Gilles Chiquito, MD

## 2013-09-11 NOTE — Procedures (Signed)
Patient was seen on dialysis and the procedure was supervised. BFR 450 Via LUE AVG BP is 93/48.  Patient appears to be tolerating treatment well

## 2013-09-12 LAB — BASIC METABOLIC PANEL
BUN: 31 mg/dL — AB (ref 6–23)
CHLORIDE: 98 meq/L (ref 96–112)
CO2: 28 mEq/L (ref 19–32)
CREATININE: 4.05 mg/dL — AB (ref 0.50–1.35)
Calcium: 9 mg/dL (ref 8.4–10.5)
GFR calc Af Amer: 16 mL/min — ABNORMAL LOW (ref >90)
GFR calc non Af Amer: 14 mL/min — ABNORMAL LOW (ref >90)
Glucose, Bld: 92 mg/dL (ref 70–99)
Potassium: 4.5 mEq/L (ref 3.7–5.3)
Sodium: 138 mEq/L (ref 137–147)

## 2013-09-12 LAB — CBC
HEMATOCRIT: 29.2 % — AB (ref 39.0–52.0)
Hemoglobin: 9.3 g/dL — ABNORMAL LOW (ref 13.0–17.0)
MCH: 33.3 pg (ref 26.0–34.0)
MCHC: 31.8 g/dL (ref 30.0–36.0)
MCV: 104.7 fL — AB (ref 78.0–100.0)
Platelets: 204 10*3/uL (ref 150–400)
RBC: 2.79 MIL/uL — ABNORMAL LOW (ref 4.22–5.81)
RDW: 15.5 % (ref 11.5–15.5)
WBC: 5.6 10*3/uL (ref 4.0–10.5)

## 2013-09-12 MED ORDER — SUCRALFATE 1 GM/10ML PO SUSP
1.0000 g | Freq: Four times a day (QID) | ORAL | Status: DC
  Administered 2013-09-12 – 2013-09-13 (×4): 1 g via ORAL
  Filled 2013-09-12 (×8): qty 10

## 2013-09-12 NOTE — Progress Notes (Signed)
Patient ID: Gerald Gerald Cooke, male   DOB: 1941/05/05, 73 y.o.   MRN: [id number]  [facility] Progress Note    Subjective:   Feels well, no recurrence of melena   Objective:   BP 116/46  Pulse 94  Temp(Src) 98.3 F (36.8 C) (Oral)  Resp 17  Ht 5\' 6"  (1.676 m)  Wt 57.7 kg (127 lb 3.3 oz)  BMI 20.54 kg/m2  SpO2 97%  Intake/Output: I/O last 3 completed shifts: In: 471.3 [P.O.:240; I.V.:231.3] Out: 1317 [Other:1317]   Intake/Output this shift:  Total I/O In: 480 [P.O.:480] Out: -  Weight change: -0.9 kg (-1 lb 15.8 oz)  Physical Exam: Gen:WD elderly WM in NAD CVS:III/VI SEM  Resp:cta DJM:EQASTM, ostomy mid epigastrum, no blood Ext:no edema, LUE AVG +T/B  Labs: BMET  Recent Labs Lab 09/10/13 2224 09/11/13 0530 09/11/13 0851 09/12/13 0415  NA 143 143 141 138  K 6.7* 5.4* 5.6* 4.5  CL 105 109 108 98  CO2 23 21 19 28   GLUCOSE 96 83 85 92  BUN 54* 57* 55* 31*  CREATININE 5.28* 5.56* 5.66* 4.05*  ALBUMIN 3.4*  --   --   --   CALCIUM 10.1 9.8 9.5 9.0   CBC  Recent Labs Lab 09/10/13 2224 09/11/13 0530 09/11/13 1417 09/12/13 0415  WBC 8.0 5.8  --  5.6  NEUTROABS 6.3  --   --   --   HGB 10.3* 8.6* 9.5* 9.3*  HCT 32.2* 27.3*  --  29.2*  MCV 106.6* 106.2*  --  104.7*  PLT 219 192  --  204    @IMGRELPRIORS @ Medications:    . amLODipine  2.5 mg Oral Daily  . atorvastatin  40 mg Oral Daily  . metoprolol tartrate  25 mg Oral Daily  . pantoprazole  40 mg Oral BID  . sevelamer carbonate  2,400 mg Oral TID WC  . sodium chloride  3 mL Intravenous Q12H  . sucralfate  1 g Oral 4 times per day  . tetrahydrozoline  1 drop Both Eyes BID     Assessment/ Plan:   1. Melena- has h/o radiation esophagitis 1. per GI, increase sulcrafate, however danger of Al toxicity.   2. ESRD - Cont with HD qMWF, no heparin due to ongoing GIB 3. Hypertension/volume - stable 4. Anemia - cont with EPO and transfuse prn 5. Metabolic bone disease - Follow  Ca/Phos/iPTH 6. Nutrition - per GI 7. Hyerkalemia- resolved after HD 8. Aortic Stenosis- for TAVR in 1 month after esophagitis resolves per Drs. Cooper and [employer]  9.   Gerald Gerald Cooke A 09/12/2013, 1:32 PM

## 2013-09-12 NOTE — Progress Notes (Addendum)
[facility] PROGRESS NOTE  Gerald Cooke KGM:[id number] DOB: 1940/09/21 DOA: 09/11/2013 PCP: Rica Mast, MD  Assessment/Plan: 1. Upper GI bleed - Discussed with GI who recommends monitoring hgb and increasing sucralfate frequency. - continue to monitor h/h levels. No bleeding reported today  2. ESRD - Nephrology on board and managing HD  3. Hyperkalemia - resolved after dialysis   Code Status: full Family Communication: discussed with patient Disposition Plan: if no more bleeding and h/h steady d/c next am.   Consultants:  Nephrology  Procedures:  HD  Antibiotics:  None  HPI/Subjective: No new complaints. No acute issues reported overnight.  Objective: Filed Vitals:   09/12/13 1039  BP: 116/46  Pulse: 94  Temp:   Resp:     Intake/Output Summary (Last 24 hours) at 09/12/13 1211 Last data filed at 09/12/13 1014  Gross per 24 hour  Intake    720 ml  Output   1317 ml  Net   -597 ml   Filed Weights   09/11/13 0411 09/11/13 0820 09/11/13 1217  Weight: 61.8 kg (136 lb 3.9 oz) 60.9 kg (134 lb 4.2 oz) 57.7 kg (127 lb 3.3 oz)    Exam:   General:  Pt in NAD, Alert and awake  Cardiovascular: RRR, addendum + sytolic murmur grade 3-4  Respiratory: CTA BL, no wheezes  Abdomen: ostomy bag in place, nd, nt  Musculoskeletal: no cyanosis or clubbing   Data Reviewed: Basic Metabolic Panel:  Recent Labs Lab 09/10/13 2224 09/11/13 0530 09/11/13 0851 09/12/13 0415  NA 143 143 141 138  K 6.7* 5.4* 5.6* 4.5  CL 105 109 108 98  CO2 23 21 19 28   GLUCOSE 96 83 85 92  BUN 54* 57* 55* 31*  CREATININE 5.28* 5.56* 5.66* 4.05*  CALCIUM 10.1 9.8 9.5 9.0   Liver Function Tests:  Recent Labs Lab 09/10/13 2224  AST 11  ALT 9  ALKPHOS 80  BILITOT 0.3  PROT 7.3  ALBUMIN 3.4*   No results found for this basename: LIPASE, AMYLASE,  in the last 168 hours No results found for this basename: AMMONIA,  in the last 168 hours CBC:  Recent  Labs Lab 09/10/13 2224 09/11/13 0530 09/11/13 1417 09/12/13 0415  WBC 8.0 5.8  --  5.6  NEUTROABS 6.3  --   --   --   HGB 10.3* 8.6* 9.5* 9.3*  HCT 32.2* 27.3*  --  29.2*  MCV 106.6* 106.2*  --  104.7*  PLT 219 192  --  204   Cardiac Enzymes: No results found for this basename: CKTOTAL, CKMB, CKMBINDEX, TROPONINI,  in the last 168 hours BNP (last 3 results)  Recent Labs  08/25/13 0305  PROBNP 42409.0*   CBG: No results found for this basename: GLUCAP,  in the last 168 hours  No results found for this or any previous visit (from the past 240 hour(s)).   Studies: No results found.  Scheduled Meds: . amLODipine  2.5 mg Oral Daily  . atorvastatin  40 mg Oral Daily  . metoprolol tartrate  25 mg Oral Daily  . pantoprazole  40 mg Oral BID  . sevelamer carbonate  2,400 mg Oral TID WC  . sodium chloride  3 mL Intravenous Q12H  . sucralfate  1 g Oral 4 times per day  . tetrahydrozoline  1 drop Both Eyes BID   Continuous Infusions: . sodium chloride 10 mL (09/11/13 1351)    Principal Problem:   UGIB (upper gastrointestinal bleed) Active Problems:  Aortic stenosis   S/P radiation therapy > 12 wks ago   ESRD (end stage renal disease) on dialysis, MWF   Hyperkalemia, diminished renal excretion    Time spent: > 35 minutes    Velvet Bathe  [facility] Pager [phone] If 7PM-7AM, please contact night-coverage at [email or web address], password [facility] 09/12/2013, 12:11 PM  LOS: 1 day

## 2013-09-13 LAB — CBC
HEMATOCRIT: 32.5 % — AB (ref 39.0–52.0)
Hemoglobin: 10.5 g/dL — ABNORMAL LOW (ref 13.0–17.0)
MCH: 33.9 pg (ref 26.0–34.0)
MCHC: 32.3 g/dL (ref 30.0–36.0)
MCV: 104.8 fL — AB (ref 78.0–100.0)
Platelets: 222 10*3/uL (ref 150–400)
RBC: 3.1 MIL/uL — ABNORMAL LOW (ref 4.22–5.81)
RDW: 15.2 % (ref 11.5–15.5)
WBC: 7.5 10*3/uL (ref 4.0–10.5)

## 2013-09-13 NOTE — Progress Notes (Signed)
Patient ID: Gerald Cooke, male   DOB: [birth year], 72 y.o.   MRN: [id number]  [facility] Progress Note    Subjective:   C/o back pain from lying in bed, no recurrence of melena   Objective:   BP 144/72  Pulse 93  Temp(Src) 97.5 F (36.4 C) (Oral)  Resp 18  Ht 5\' 6"  (1.676 m)  Wt 58.6 kg (129 lb 3 oz)  BMI 20.86 kg/m2  SpO2 100%  Intake/Output: I/O last 3 completed shifts: In: 1080 [P.O.:1080] Out: -    Intake/Output this shift:  Total I/O In: 243 [P.O.:240; I.V.:3] Out: -  Weight change: -2.3 kg (-5 lb 1.1 oz)  Physical Exam: Gen:WD WN WM in NAD CVS:III/VI SEM Resp:cta JQB:HALPFX, ostomy with clear output Ext:no edema, LUE AVG +T/B  Labs: BMET  Recent Labs Lab 09/10/13 2224 09/11/13 0530 09/11/13 0851 09/12/13 0415  NA 143 143 141 138  K 6.7* 5.4* 5.6* 4.5  CL 105 109 108 98  CO2 23 21 19 28   GLUCOSE 96 83 85 92  BUN 54* 57* 55* 31*  CREATININE 5.28* 5.56* 5.66* 4.05*  ALBUMIN 3.4*  --   --   --   CALCIUM 10.1 9.8 9.5 9.0   CBC  Recent Labs Lab 09/10/13 2224 09/11/13 0530 09/11/13 1417 09/12/13 0415 09/13/13 1210  WBC 8.0 5.8  --  5.6 7.5  NEUTROABS 6.3  --   --   --   --   HGB 10.3* 8.6* 9.5* 9.3* 10.5*  HCT 32.2* 27.3*  --  29.2* 32.5*  MCV 106.6* 106.2*  --  104.7* 104.8*  PLT 219 192  --  204 222    @IMGRELPRIORS @ Medications:    . amLODipine  2.5 mg Oral Daily  . atorvastatin  40 mg Oral Daily  . metoprolol tartrate  25 mg Oral Daily  . pantoprazole  40 mg Oral BID  . sevelamer carbonate  2,400 mg Oral TID WC  . sodium chloride  3 mL Intravenous Q12H  . tetrahydrozoline  1 drop Both Eyes BID   Dialysis Orders: Center: [facility] on MWF .  EDW 60.5kg HD Bath 2K/2Ca Time 3hr Heparin none. Access LUE AVG BFR 450 DFR 800  Hectoral 36mcg mcg IV/HD Epogen 8200 Units IV/HD    Assessment/ Plan:   1. 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 per GI, increase sulcrafate, however danger of Al toxicity.   2. ESRD - Cont with HD qMWF, no heparin due to ongoing GIB 1. Not sure if he will be discharged today due to stable H/H but will order HD tomorrow in case he stays 3. Hypertension/volume - stable 4. Anemia - cont with EPO and transfuse prn 5. Metabolic bone disease - Follow Ca/Phos/iPTH 6. Nutrition - per GI 7. Hyerkalemia- resolved after HD 8. Aortic Stenosis- for TAVR in 1 month after esophagitis resolves per Drs. Cooper and [employer]  9. Dispo- per primary svc. HD tomorrow if he is not discharged to home today  [place] A 09/13/2013, 12:53 PM

## 2013-09-13 NOTE — Progress Notes (Signed)
Nursing note Patient given discharge instructions and medication list. All questions answered and AVS given to patient. Will discharge home as ordered. Maylani Embree, Bettina Gavia RN

## 2013-09-13 NOTE — Discharge Summary (Signed)
Physician Discharge Summary  Gerald Cooke FBP:[id number] DOB: 1941-07-12 DOA: 09/11/2013  PCP: Rica Mast, MD  Admit date: 09/11/2013 Discharge date: 09/13/2013  Time spent: > 35  minutes  Recommendations for Outpatient Follow-up:  Please follow up with patients hemoglobin levels  Discharge Diagnoses:  Principal Problem:   UGIB (upper gastrointestinal bleed) Active Problems:   Aortic stenosis   S/P radiation therapy > 12 wks ago   ESRD (end stage renal disease) on dialysis, MWF   Hyperkalemia, diminished renal excretion   Discharge Condition: stable  Diet recommendation: Low sodium heart healthy/renal diet.  Filed Weights   09/11/13 0820 09/11/13 1217 09/13/13 0506  Weight: 60.9 kg (134 lb 4.2 oz) 57.7 kg (127 lb 3.3 oz) 58.6 kg (129 lb 3 oz)    History of present illness:  Pt is a 73 y/o with history of ESRD on HD (M,W,F), Aortic valve stenosis, h/o lung carcinoma non-small cell clinical stage IIIB treated with radiation therapy and chemotherapy in 2009,  UGIB with endoscopy 08/27/13 which revealed some post radiation esophagitis. Patient presented to the ED 2ary to noticing some blood in his ostomy bag.  Hospital Course:  1. Upper GI bleed - Discussed with GI who recommends monitoring hgb and increasing sucralfate frequency. Will discharge on home regimen of sucralfate as increasing the frequency, given his ESRD, may result in aluminum toxicity. - continue to monitor h/h levels as outpatient. - hgb levels rising and improved from last check.  2. ESRD  - Pt to continue routine dialysis schedule and follow up with nephrologist  3. Hyperkalemia  - resolved after dialysis  4. Aortic valve stenosis - Pt to follow up with cardiology for further recommendations as outpatient.   Procedures:  None  Consultations:  Nephrology  Discussed with GI  Discharge Exam: Filed Vitals:   09/13/13 0954  BP: 144/72  Pulse: 93  Temp:   Resp:     General: Pt in  NAD, Alert and awake Cardiovascular: RRR, + systolic murmur Respiratory: CTA BL, no wheezes  Discharge Instructions  Discharge Orders   Future Appointments Provider Department Dept Phone   09/15/2013 11:00 AM [facility] [phone]   Pt should arrive15 minutes prior to scheduled appt time. Please inform patient that exam will take a minimum of 1 1/2 hours. Patient to be NPO 6 hours prior to exam  and should not take any insulin the day of exam.   09/25/2013 2:30 PM Rexene Alberts, MD [place] [phone]   09/25/2013 2:30 PM Sherren Mocha, MD [place] [phone]   Future Orders Complete By Expires   Diet - low sodium heart healthy  As directed    Increase activity slowly  As directed        Medication List         amLODipine 2.5 MG tablet  Commonly known as:  NORVASC  Take 1 tablet (2.5 mg total) by mouth daily.     aspirin EC 81 MG tablet  Take 81 mg by mouth daily.     atorvastatin 40 MG tablet  Commonly known as:  LIPITOR  Take 40 mg by mouth daily.     COMBIVENT RESPIMAT 20-100 MCG/ACT Aers respimat  Generic drug:  Ipratropium-Albuterol  Inhale 1 puff into the lungs 2 (two) times daily as needed for wheezing or shortness of breath.     lactose free nutrition Liqd  Take 237 mLs  by mouth daily as needed (protein supplement).     metoprolol tartrate 25 MG tablet  Commonly known as:  LOPRESSOR  Take 25 mg by mouth See admin instructions. Take 1 tablet (25 mg) every morning, may take an additional tablet in the evening if blood pressure is over 160     ondansetron 8 MG tablet  Commonly known as:  ZOFRAN  Take 8 mg by mouth 2 (two) times daily as needed for nausea or vomiting.     pantoprazole 40 MG tablet  Commonly known as:  PROTONIX  Take 1 tablet (40 mg total) by mouth 2 (two) times daily.     sevelamer carbonate 800 MG  tablet  Commonly known as:  RENVELA  Take 800-2,400 mg by mouth See admin instructions. Take 1-2 tablets ([phone] mg) with snacks, and 3 tablets (2400 mg) with meals     sucralfate 1 GM/10ML suspension  Commonly known as:  CARAFATE  Take 1 g by mouth See admin instructions. Take 10 mls (2 grams) twice daily, may take an additional dose at bedtime if needed for acid indigestion     VISINE OP  Place 1 drop into both eyes 2 (two) times daily as needed (blurriness/ dry eyes).       Allergies  Allergen Reactions  . 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 Shellfish Allergy Other (See Comments)    Throat swelled/ choking  . Tetanus Toxoids Swelling    Arm swelled up      The results of significant diagnostics from this hospitalization (including imaging, microbiology, ancillary and laboratory) are listed below for reference.    Significant Diagnostic Studies: Dg Chest 2 View  08/25/2013   CLINICAL DATA:  Shortness of Breath  EXAM: CHEST  2 VIEW  COMPARISON:  August 21, 2013  FINDINGS: There is persistent volume loss in the right upper lobe with a cavitary lesion which appear stable. There is mild scarring in the bases. There is calcification along both hemidiaphragms as well as along several pleural surfaces consistent with previous asbestos exposure. There is some localized pleural thickening in the left mid hemi thorax, stable. There is no new opacity. There is no edema or pulmonary venous hypertension. No cardiomegaly. There is a stent in the innominate vein regions.  IMPRESSION: No appreciable change from the most recent prior study. Cavitary lesion with a cicatrization in the right upper lobe, stable. Evidence of previous asbestos exposure. No frank edema or consolidation. No overt congestive heart failure.   Electronically  Signed   By: Lowella Grip M.D.   On: 08/25/2013 08:05   Ct Angio Chest Pe W/cm &/or Wo Cm  08/25/2013   CLINICAL DATA:  Rule out pulmonary embolism, follow-up lung malignancy  EXAM: CT ANGIOGRAPHY CHEST WITH CONTRAST  TECHNIQUE: Multidetector CT imaging of the chest was performed using the standard protocol during bolus administration of intravenous contrast. Multiplanar CT image reconstructions including MIPs were obtained to evaluate the vascular anatomy.  CONTRAST:  145mL OMNIPAQUE IOHEXOL 350 MG/ML SOLN  COMPARISON:  DG CHEST 2 VIEW dated 08/25/2013; CT ANGIO CHEST dated 05/29/2013  FINDINGS: Contrast within the pulmonary arterial tree is normal in appearance. There are no filling defects to suggest an acute pulmonary embolism. The caliber of the thoracic aorta is normal. No false lumen is demonstrated. The cardiac chambers are mildly enlarged  and stable. There is no pericardial or pleural effusion. Coronary artery calcifications are demonstrated. Calcifications of the aortic and mitral valve are stable. No bulky mediastinal or hilar lymph nodes are demonstrated. The caliber of the thoracic esophagus appears normal.  There is abnormal parenchymal consolidation in the right upper lobe consistent with a thick-walled cavitary lesion. Allowing for differences in positioning this has not significantly changed since the previous study. Density previously demonstrated in the posterior superior aspect of the right lower lobe has improved and increased interstitial density posteriorly in the left lower lobe is less prominent today.  Within the upper abdomen the observed portions of the liver and spleen appear normal. The right kidney is atrophic where visualized. Calcification of the visualized portions of the left adrenal gland is demonstrated. Calcification of the pleura over the hemidiaphragms is stable.  The observed portions of the ribs exhibit no acute abnormalities. The thoracic vertebral bodies are preserved  in height with the exception of T5 which exhibits mild anterior wedging which is stable. There is prominent thoracic kyphosis.  Review of the MIP images confirms the above findings.  IMPRESSION: 1. There is no evidence of an acute pulmonary embolism or acute thoracic aortic pathology. 2. The small bilateral pleural effusions seen on the study of November 2014 have resolved. 3. There is stable increased density in the right upper lobe. Patchy interstitial density in the lower lobes bilaterally has resolved. 4. Extensive pleural calcifications are present bilaterally.   Electronically Signed   By: David  Martinique   On: 08/25/2013 15:22   Ct Cardiac Morph/pulm Vein W/cm&w/o Ca Score  09/02/2013   ADDENDUM REPORT: 09/02/2013 19:20  CLINICAL DATA:  Aorticstenosis  EXAM: Cardiac TAVR CT  TECHNIQUE: The patient was scanned on a Philips 256 scanner. A 120 kV retrospective scan was triggered in the descending thoracic aorta at 111 HU's. Gantry rotation speed was 270 msecs and collimation was .9 mm. 10 mg of iv Metoprolol and no nitro was given. The 3D data set was reconstructed in 5% intervals of the R-R cycle. Systolic and diastolic phases were analyzed on a dedicated work station using MPR, MIP and VRT modes. The patient received 80 cc of contrast.  FINDINGS: Aortic Valve: Moderately thickened and severely calcified with limited but still somewhat preserved leaflet opening.  Aorta: Mild aortic calcifications in the ascending and descending thoracic aorta and moderate calcifications in the aortic arch predominantly at the origin of the brachiocephalic arteries.  Sinotubular Junction:  29 x 29 mm  Ascending Thoracic Aorta:  32 x 31 mm  Aortic Arch:  27 x 27 mm  Descending Thoracic Aorta:  26 x 26 mm  Sinus of Valsalva Measurements:  Non-coronary:  34 mm  Right -coronary:  35 mm  Left -coronary:  35 mm  Coronary Artery Height above Annulus:  Left Main:  11 mm  Right Coronary:  18 mm  Virtual Basal Annulus Measurements:   Maximum/Minimum Diameter:  25.7 x 29.6 mm  Perimeter:  91 mm  Area:  5.8 cm2  Coronary Arteries: Heavily calcified, the study is insufficient for coronary artery read, no nitro was given.  Optimum Fluoroscopic Angle for Delivery:  LAO 10, CAU 10.  Distance from annulus to sheath tip is 70 mm (>60 mm required for 29 mm valve).  IMPRESSION: 1. Severely calcified aortic valve with measurement suitable for 29 mm Edwards-Sapien Valve.  2.  Optimum deployment angle is LAO 10, CAU 10.  3.  Sufficient distance from annulus to coronary arteries.  4. Sufficient distance from annulus to sheath tip for transaortic approach.  Ena Dawley   Electronically Signed   By: Ena Dawley   On: 09/02/2013 19:20   09/02/2013   EXAM: OVER-READ INTERPRETATION  CT CHEST  The following report is an over-read performed by radiologist Dr. Rebekah Chesterfield [facility] on 09/01/2013. This over-read does not include interpretation of cardiac or coronary anatomy or pathology. The interpretation by the cardiologist is attached.  COMPARISON:  CTA of the chest, abdomen and pelvis 08/29/2013. CT of the chest 05/29/2013.  FINDINGS: Please see separate dictation for CTA of the chest, abdomen and pelvis from 08/29/2013 for full description of extracardiac findings.  IMPRESSION: Please see separate dictation for CTA of the chest, abdomen and pelvis from 08/29/2013 for full description of extracardiac findings.  Electronically Signed: By: Vinnie Langton M.D. On: 09/01/2013 08:37   Ct Angio Chest Aorta W/cm &/or Wo/cm  08/31/2013   CLINICAL DATA:  73 year old male with history of severe aortic stenosis. Preprocedural study prior to potential transcatheter aortic valve replacement (TAVR) procedure.  EXAM: CT ANGIOGRAPHY CHEST, ABDOMEN AND PELVIS  TECHNIQUE: Multidetector CT imaging through the chest, abdomen and pelvis was performed using the standard protocol during bolus administration of intravenous contrast. Multiplanar  reconstructed images and MIPs were obtained and reviewed to evaluate the vascular anatomy.  CONTRAST:  70 mL of Omnipaque 350.  COMPARISON:  PET-CT 06/11/2013.  FINDINGS: CT CHEST FINDINGS  Mediastinum: Heart size is borderline enlarged. There is likely concentric left ventricular hypertrophy. There is no significant pericardial fluid, thickening or pericardial calcification. There is atherosclerosis of the thoracic aorta, the great vessels of the mediastinum and the coronary arteries, including calcified atherosclerotic plaque in the left main, left anterior descending, left circumflex and right coronary arteries. Severe calcifications of the aortic valve. Calcified right peritracheal lymph nodes. Prominent soft tissue in the right hilar and suprahilar region, similar to prior studies, without well definable lymphadenopathy. Esophagus is unremarkable in appearance. Left innominate vein stent extending into the superior vena cava.  Lungs/Pleura: Area of masslike consolidation, chronic volume loss, architectural distortion and cavitation in the right apex, similar to prior examinations (recently demonstrated to have peripheral hypermetabolism on PET-CT 06/11/2013). No acute consolidative airspace disease in the remainder of the lungs. Chronic trace left pleural effusion is unchanged. Numerous calcified pleural plaques throughout the thorax bilaterally. No definite new suspicious appearing pulmonary nodules.  Musculoskeletal: There are no aggressive appearing lytic or blastic lesions noted in the visualized portions of the skeleton.  CT ABDOMEN AND PELVIS FINDINGS  Abdomen/Pelvis: The appearance of the liver, gallbladder, pancreas, spleen and right adrenal gland is unremarkable. Calcifications in the left adrenal gland may relate to prior infection or hemorrhage. Unusual calcifications associated with the lower pole of the left kidney and the surrounding retroperitoneal fat overlying the left psoas muscle are similar  to prior examinations, presumably related to prior trauma/hemorrhage. There is extensive scarring throughout the posterior aspect of the left kidney an in the lower pole. The right kidney is severely atrophic.  Trace volume of ascites. No pneumoperitoneum. No pathologic distention of small bowel. Status post left hemicolectomy with right lower quadrant colostomy. No definite lymphadenopathy in the abdomen or pelvis.  Status post aorto bi-iliac bypass graft placement. Vascular findings and measurements pertinent 2 potential TAVR procedure, as described below. In addition, there appears to be significant stenosis at the ostium of the superior mesenteric artery which is narrowed to approximately 3 mm (distal to the area of  narrowing common luminal diameter is approximately 9 mm). Single right renal artery appears severely stenotic. Single left renal artery is grossly patent, although the ostium of the vessel is difficult to visualize on today's examination secondary to adjacent graft and severe calcifications. Inferior mesenteric artery appears occluded.  Musculoskeletal: Bilateral pars defects at L5. There are no aggressive appearing lytic or blastic lesions noted in the visualized portions of the skeleton.  VASCULAR MEASUREMENTS PERTINENT TO TAVR:  AORTA:  Minimal Aortic Diameter -  13 x 11 mm  Severity of Aortic Calcification -  Severe  RIGHT PELVIS:  Right Common Iliac Artery -  Minimal Diameter - 4.2 x 3.9 mm  Tortuosity - Mild  Calcification - Severe  Right External Iliac Artery -  Minimal Diameter - 5.3 x 6.4 mm  Tortuosity - Mild  Calcification - Moderate  Right Common Femoral Artery -  Minimal Diameter - 6.5 x 5.4 mm  Tortuosity - Mild  Calcification - Moderate  LEFT PELVIS:  Left Common Iliac Artery -  Minimal Diameter - 5.4 x 5.2 mm  Tortuosity - Mild  Calcification - Severe  Left External Iliac Artery -  Minimal Diameter - 5.5 x 5.4 mm  Tortuosity - Mild  Calcification - Moderate  Left Common Femoral Artery  -  Minimal Diameter - 5.7 x 4.6 mm  Tortuosity - Mild  Calcification - Severe  Review of the MIP images confirms the above findings.  IMPRESSION: 1. Vascular findings and measurements pertinent to potential TAVR procedure, as detailed above. This patient does not appear to have suitable pelvic arterial access. 2. Persistent thick-walled cavitary mass like area in the right apex. Given the hypermetabolism on prior PET-CT 06/11/2013, the possibility of residual/recurrent disease in this region is not excluded, however, given the stability of this area over the prior several examinations, the hypermetabolism may simply be related to chronic inflammation from colonization of this cavitary lesion. Continued attention on followup studies is recommended. 3. Bilateral calcified pleural plaques redemonstrated, indicative of asbestos related pleural disease. 4. Concentric left ventricular hypertrophy. 5. Atherosclerosis, including left main and 3 vessel coronary artery disease. 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 6. Status post left hemicolectomy with right lower quadrant ostomy. 7. Additional incidental findings, as above.   Electronically Signed   By: Vinnie Langton M.D.   On: 08/31/2013 12:57   Ct Angio Abd/pel W/ And/or W/o  08/31/2013   CLINICAL DATA:  73 year old male with history of severe aortic stenosis. Preprocedural study prior to potential transcatheter aortic valve replacement (TAVR) procedure.  EXAM: CT ANGIOGRAPHY CHEST, ABDOMEN AND PELVIS  TECHNIQUE: Multidetector CT imaging through the chest, abdomen and pelvis was performed using the standard protocol during bolus administration of intravenous contrast.  Multiplanar reconstructed images and MIPs were obtained and reviewed to evaluate the vascular anatomy.  CONTRAST:  70 mL of Omnipaque 350.  COMPARISON:  PET-CT 06/11/2013.  FINDINGS: CT CHEST FINDINGS  Mediastinum: Heart size is borderline enlarged. There is likely concentric left ventricular hypertrophy. There is no significant pericardial fluid, thickening or pericardial calcification. There is atherosclerosis of the thoracic aorta, the great vessels of the mediastinum and the coronary arteries, including calcified atherosclerotic plaque in the left main, left anterior descending, left circumflex and right coronary arteries.  Severe calcifications of the aortic valve. Calcified right peritracheal lymph nodes. Prominent soft tissue in the right hilar and suprahilar region, similar to prior studies, without well definable lymphadenopathy. Esophagus is unremarkable in appearance. Left innominate vein stent extending into the superior vena cava.  Lungs/Pleura: Area of masslike consolidation, chronic volume loss, architectural distortion and cavitation in the right apex, similar to prior examinations (recently demonstrated to have peripheral hypermetabolism on PET-CT 06/11/2013). No acute consolidative airspace disease in the remainder of the lungs. Chronic trace left pleural effusion is unchanged. Numerous calcified pleural plaques throughout the thorax bilaterally. No definite new suspicious appearing pulmonary nodules.  Musculoskeletal: There are no aggressive appearing lytic or blastic lesions noted in the visualized portions of the skeleton.  CT ABDOMEN AND PELVIS FINDINGS  Abdomen/Pelvis: The appearance of the liver, gallbladder, pancreas, spleen and right adrenal gland is unremarkable. Calcifications in the left adrenal gland may relate to prior infection or hemorrhage. Unusual calcifications associated with the lower pole of the left kidney and the surrounding retroperitoneal fat overlying the left psoas muscle  are similar to prior examinations, presumably related to prior trauma/hemorrhage. There is extensive scarring throughout the posterior aspect of the left kidney an in the lower pole. The right kidney is severely atrophic.  Trace volume of ascites. No pneumoperitoneum. No pathologic distention of small bowel. Status post left hemicolectomy with right lower quadrant colostomy. No definite lymphadenopathy in the abdomen or pelvis.  Status post aorto bi-iliac bypass graft placement. Vascular findings and measurements pertinent 2 potential TAVR procedure, as described below. In addition, there appears to be significant stenosis at the ostium of the superior mesenteric artery which is narrowed to approximately 3 mm (distal to the area of narrowing common luminal diameter is approximately 9 mm). Single right renal artery appears severely stenotic. Single left renal artery is grossly patent, although the ostium of the vessel is difficult to visualize on today's examination secondary to adjacent graft and severe calcifications. Inferior mesenteric artery appears occluded.  Musculoskeletal: Bilateral pars defects at L5. There are no aggressive appearing lytic or blastic lesions noted in the visualized portions of the skeleton.  VASCULAR MEASUREMENTS PERTINENT TO TAVR:  AORTA:  Minimal Aortic Diameter -  13 x 11 mm  Severity of Aortic Calcification -  Severe  RIGHT PELVIS:  Right Common Iliac Artery -  Minimal Diameter - 4.2 x 3.9 mm  Tortuosity - Mild  Calcification - Severe  Right External Iliac Artery -  Minimal Diameter - 5.3 x 6.4 mm  Tortuosity - Mild  Calcification - Moderate  Right Common Femoral Artery -  Minimal Diameter - 6.5 x 5.4 mm  Tortuosity - Mild  Calcification - Moderate  LEFT PELVIS:  Left Common Iliac Artery -  Minimal Diameter - 5.4 x 5.2 mm  Tortuosity - Mild  Calcification - Severe  Left External Iliac Artery -  Minimal Diameter - 5.5 x 5.4 mm  Tortuosity - Mild  Calcification - Moderate  Left Common  Femoral Artery -  Minimal Diameter - 5.7 x 4.6 mm  Tortuosity - Mild  Calcification - Severe  Review of the MIP images confirms the above findings.  IMPRESSION: 1. Vascular findings and measurements pertinent to potential TAVR procedure, as detailed above. This patient does not appear to have suitable pelvic arterial access. 2. Persistent thick-walled cavitary mass like area in the right apex. Given the hypermetabolism on prior PET-CT 06/11/2013, the possibility of residual/recurrent disease in this region is not excluded, however, given the stability of  this area over the prior several examinations, the hypermetabolism may simply be related to chronic inflammation from colonization of this cavitary lesion. Continued attention on followup studies is recommended. 3. Bilateral calcified pleural plaques redemonstrated, indicative of asbestos related pleural disease. 4. Concentric left ventricular hypertrophy. 5. Atherosclerosis, including left main and 3 vessel coronary artery disease. 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 6. Status post left hemicolectomy with right lower quadrant ostomy. 7. Additional incidental findings, as above.   Electronically Signed   By: Vinnie Langton M.D.   On: 08/31/2013 12:57    Microbiology: No results found for this or any previous visit (from the past 240 hour(s)).   Labs: Basic Metabolic Panel:  Recent Labs Lab 09/10/13 2224 09/11/13 0530 09/11/13 0851 09/12/13 0415  NA 143 143 141 138  K 6.7* 5.4* 5.6* 4.5  CL 105 109 108 98  CO2 23 21 19 28   GLUCOSE 96 83 85 92  BUN 54* 57* 55* 31*  CREATININE 5.28* 5.56* 5.66* 4.05*  CALCIUM 10.1 9.8 9.5 9.0   Liver Function  Tests:  Recent Labs Lab 09/10/13 2224  AST 11  ALT 9  ALKPHOS 80  BILITOT 0.3  PROT 7.3  ALBUMIN 3.4*   No results found for this basename: LIPASE, AMYLASE,  in the last 168 hours No results found for this basename: AMMONIA,  in the last 168 hours CBC:  Recent Labs Lab 09/10/13 2224 09/11/13 0530 09/11/13 1417 09/12/13 0415 09/13/13 1210  WBC 8.0 5.8  --  5.6 7.5  NEUTROABS 6.3  --   --   --   --   HGB 10.3* 8.6* 9.5* 9.3* 10.5*  HCT 32.2* 27.3*  --  29.2* 32.5*  MCV 106.6* 106.2*  --  104.7* 104.8*  PLT 219 192  --  204 222   Cardiac Enzymes: No results found for this basename: CKTOTAL, CKMB, CKMBINDEX, TROPONINI,  in the last 168 hours BNP: BNP (last 3 results)  Recent Labs  08/25/13 0305  PROBNP 42409.0*   CBG: No results found for this basename: GLUCAP,  in the last 168 hours     Signed:  Velvet Bathe  [facility] 09/13/2013, 1:28 PM

## 2013-09-14 ENCOUNTER — Ambulatory Visit (HOSPITAL_COMMUNITY): Payer: Medicare Other

## 2013-09-15 ENCOUNTER — Ambulatory Visit (HOSPITAL_COMMUNITY)
Admission: RE | Admit: 2013-09-15 | Discharge: 2013-09-15 | Disposition: A | Discharge status: Home / Self Care | Payer: Medicare Other | Source: Ambulatory Visit | Attending: Thoracic Surgery (Cardiothoracic Vascular Surgery) | Admitting: Thoracic Surgery (Cardiothoracic Vascular Surgery)

## 2013-09-15 ENCOUNTER — Encounter (HOSPITAL_COMMUNITY): Payer: Self-pay

## 2013-09-15 ENCOUNTER — Other Ambulatory Visit: Payer: Self-pay | Admitting: *Deleted

## 2013-09-15 DIAGNOSIS — J320 Chronic maxillary sinusitis: Secondary | ICD-10-CM | POA: Insufficient documentation

## 2013-09-15 DIAGNOSIS — C3490 Malignant neoplasm of unspecified part of unspecified bronchus or lung: Secondary | ICD-10-CM

## 2013-09-15 DIAGNOSIS — M539 Dorsopathy, unspecified: Secondary | ICD-10-CM | POA: Insufficient documentation

## 2013-09-15 DIAGNOSIS — R188 Other ascites: Secondary | ICD-10-CM | POA: Insufficient documentation

## 2013-09-15 LAB — GLUCOSE, CAPILLARY: Glucose-Capillary: 85 mg/dL (ref 70–99)

## 2013-09-15 MED ORDER — FLUDEOXYGLUCOSE F - 18 (FDG) INJECTION
8.4000 | Freq: Once | INTRAVENOUS | Status: AC | PRN
  Administered 2013-09-15: 8.4 via INTRAVENOUS

## 2013-09-18 ENCOUNTER — Inpatient Hospital Stay (HOSPITAL_COMMUNITY)
Admission: AD | Admit: 2013-09-18 | Discharge: 2013-09-21 | DRG: 377 | Disposition: A | Discharge status: Home / Self Care | Payer: Medicare Other | Source: Other Acute Inpatient Hospital | Attending: Internal Medicine | Admitting: Internal Medicine

## 2013-09-18 ENCOUNTER — Inpatient Hospital Stay: Payer: Self-pay | Admitting: Internal Medicine

## 2013-09-18 DIAGNOSIS — N186 End stage renal disease: Secondary | ICD-10-CM

## 2013-09-18 DIAGNOSIS — K208 Other esophagitis without bleeding: Secondary | ICD-10-CM

## 2013-09-18 DIAGNOSIS — D62 Acute posthemorrhagic anemia: Secondary | ICD-10-CM

## 2013-09-18 DIAGNOSIS — M949 Disorder of cartilage, unspecified: Secondary | ICD-10-CM

## 2013-09-18 DIAGNOSIS — I10 Essential (primary) hypertension: Secondary | ICD-10-CM

## 2013-09-18 DIAGNOSIS — Z923 Personal history of irradiation: Secondary | ICD-10-CM

## 2013-09-18 DIAGNOSIS — R634 Abnormal weight loss: Secondary | ICD-10-CM

## 2013-09-18 DIAGNOSIS — T66XXXA Radiation sickness, unspecified, initial encounter: Secondary | ICD-10-CM | POA: Diagnosis present

## 2013-09-18 DIAGNOSIS — K922 Gastrointestinal hemorrhage, unspecified: Secondary | ICD-10-CM

## 2013-09-18 DIAGNOSIS — K921 Melena: Principal | ICD-10-CM | POA: Diagnosis present

## 2013-09-18 DIAGNOSIS — J189 Pneumonia, unspecified organism: Secondary | ICD-10-CM

## 2013-09-18 DIAGNOSIS — Z85118 Personal history of other malignant neoplasm of bronchus and lung: Secondary | ICD-10-CM

## 2013-09-18 DIAGNOSIS — R748 Abnormal levels of other serum enzymes: Secondary | ICD-10-CM | POA: Diagnosis present

## 2013-09-18 DIAGNOSIS — E875 Hyperkalemia: Secondary | ICD-10-CM

## 2013-09-18 DIAGNOSIS — F1010 Alcohol abuse, uncomplicated: Secondary | ICD-10-CM | POA: Diagnosis present

## 2013-09-18 DIAGNOSIS — Z87891 Personal history of nicotine dependence: Secondary | ICD-10-CM

## 2013-09-18 DIAGNOSIS — E8809 Other disorders of plasma-protein metabolism, not elsewhere classified: Secondary | ICD-10-CM

## 2013-09-18 DIAGNOSIS — I471 Supraventricular tachycardia, unspecified: Secondary | ICD-10-CM

## 2013-09-18 DIAGNOSIS — C3490 Malignant neoplasm of unspecified part of unspecified bronchus or lung: Secondary | ICD-10-CM

## 2013-09-18 DIAGNOSIS — Z933 Colostomy status: Secondary | ICD-10-CM

## 2013-09-18 DIAGNOSIS — Z992 Dependence on renal dialysis: Secondary | ICD-10-CM

## 2013-09-18 DIAGNOSIS — M129 Arthropathy, unspecified: Secondary | ICD-10-CM | POA: Diagnosis present

## 2013-09-18 DIAGNOSIS — D500 Iron deficiency anemia secondary to blood loss (chronic): Secondary | ICD-10-CM | POA: Diagnosis present

## 2013-09-18 DIAGNOSIS — N2581 Secondary hyperparathyroidism of renal origin: Secondary | ICD-10-CM | POA: Diagnosis present

## 2013-09-18 DIAGNOSIS — E785 Hyperlipidemia, unspecified: Secondary | ICD-10-CM

## 2013-09-18 DIAGNOSIS — I359 Nonrheumatic aortic valve disorder, unspecified: Secondary | ICD-10-CM

## 2013-09-18 DIAGNOSIS — K219 Gastro-esophageal reflux disease without esophagitis: Secondary | ICD-10-CM

## 2013-09-18 DIAGNOSIS — N185 Chronic kidney disease, stage 5: Secondary | ICD-10-CM

## 2013-09-18 DIAGNOSIS — E46 Unspecified protein-calorie malnutrition: Secondary | ICD-10-CM

## 2013-09-18 DIAGNOSIS — I252 Old myocardial infarction: Secondary | ICD-10-CM

## 2013-09-18 DIAGNOSIS — Z9221 Personal history of antineoplastic chemotherapy: Secondary | ICD-10-CM

## 2013-09-18 DIAGNOSIS — Y842 Radiological procedure and radiotherapy as the cause of abnormal reaction of the patient, or of later complication, without mention of misadventure at the time of the procedure: Secondary | ICD-10-CM | POA: Diagnosis present

## 2013-09-18 DIAGNOSIS — Z888 Allergy status to other drugs, medicaments and biological substances status: Secondary | ICD-10-CM

## 2013-09-18 DIAGNOSIS — N189 Chronic kidney disease, unspecified: Secondary | ICD-10-CM

## 2013-09-18 DIAGNOSIS — M899 Disorder of bone, unspecified: Secondary | ICD-10-CM | POA: Diagnosis present

## 2013-09-18 DIAGNOSIS — I2510 Atherosclerotic heart disease of native coronary artery without angina pectoris: Secondary | ICD-10-CM | POA: Diagnosis present

## 2013-09-18 DIAGNOSIS — I120 Hypertensive chronic kidney disease with stage 5 chronic kidney disease or end stage renal disease: Secondary | ICD-10-CM | POA: Diagnosis present

## 2013-09-18 DIAGNOSIS — I5032 Chronic diastolic (congestive) heart failure: Secondary | ICD-10-CM

## 2013-09-18 DIAGNOSIS — D631 Anemia in chronic kidney disease: Secondary | ICD-10-CM

## 2013-09-18 DIAGNOSIS — I350 Nonrheumatic aortic (valve) stenosis: Secondary | ICD-10-CM

## 2013-09-18 DIAGNOSIS — I509 Heart failure, unspecified: Secondary | ICD-10-CM | POA: Diagnosis present

## 2013-09-18 LAB — CBC WITH DIFFERENTIAL/PLATELET
Basophils Absolute: 0 10*3/uL (ref 0.0–0.1)
Basophils Relative: 0 % (ref 0–1)
EOS ABS: 0 10*3/uL (ref 0.0–0.7)
EOS PCT: 1 % (ref 0–5)
HCT: 30.2 % — ABNORMAL LOW (ref 39.0–52.0)
HEMOGLOBIN: 10 g/dL — AB (ref 13.0–17.0)
Lymphocytes Relative: 9 % — ABNORMAL LOW (ref 12–46)
Lymphs Abs: 0.3 10*3/uL — ABNORMAL LOW (ref 0.7–4.0)
MCH: 32.3 pg (ref 26.0–34.0)
MCHC: 33.1 g/dL (ref 30.0–36.0)
MCV: 97.4 fL (ref 78.0–100.0)
MONOS PCT: 12 % (ref 3–12)
Monocytes Absolute: 0.4 10*3/uL (ref 0.1–1.0)
NEUTROS PCT: 78 % — AB (ref 43–77)
Neutro Abs: 2.7 10*3/uL (ref 1.7–7.7)
Platelets: 163 10*3/uL (ref 150–400)
RBC: 3.1 MIL/uL — ABNORMAL LOW (ref 4.22–5.81)
RDW: 17.8 % — ABNORMAL HIGH (ref 11.5–15.5)
WBC: 3.5 10*3/uL — ABNORMAL LOW (ref 4.0–10.5)

## 2013-09-18 LAB — COMPREHENSIVE METABOLIC PANEL
ALBUMIN: 2.7 g/dL — AB (ref 3.4–5.0)
ALK PHOS: 71 U/L
ALT: 12 U/L (ref 0–53)
ALT: 21 U/L (ref 12–78)
AST: 17 U/L (ref 0–37)
AST: 28 U/L (ref 15–37)
Albumin: 2.6 g/dL — ABNORMAL LOW (ref 3.5–5.2)
Alkaline Phosphatase: 59 U/L (ref 39–117)
Anion Gap: 6 — ABNORMAL LOW (ref 7–16)
BUN: 30 mg/dL — AB (ref 6–23)
BUN: 53 mg/dL — AB (ref 7–18)
Bilirubin,Total: 0.3 mg/dL (ref 0.2–1.0)
CALCIUM: 8.6 mg/dL (ref 8.4–10.5)
CHLORIDE: 108 mmol/L — AB (ref 98–107)
CO2: 29 mEq/L (ref 19–32)
CO2: 25 mmol/L (ref 21–32)
CREATININE: 3.83 mg/dL — AB (ref 0.50–1.35)
Calcium, Total: 8.2 mg/dL — ABNORMAL LOW (ref 8.5–10.1)
Chloride: 102 mEq/L (ref 96–112)
Creatinine: 6.27 mg/dL — ABNORMAL HIGH (ref 0.60–1.30)
GFR CALC AF AMER: 9 — AB
GFR CALC NON AF AMER: 8 — AB
GFR calc non Af Amer: 15 mL/min — ABNORMAL LOW (ref >90)
GFR, EST AFRICAN AMERICAN: 17 mL/min — AB (ref >90)
GLUCOSE: 135 mg/dL — AB (ref 70–99)
Glucose: 90 mg/dL (ref 65–99)
OSMOLALITY: 291 (ref 275–301)
Potassium: 4.9 mmol/L (ref 3.5–5.1)
Potassium: 4.2 mEq/L (ref 3.7–5.3)
SODIUM: 139 mmol/L (ref 136–145)
Sodium: 144 mEq/L (ref 137–147)
TOTAL PROTEIN: 6 g/dL (ref 6.0–8.3)
TOTAL PROTEIN: 6 g/dL — AB (ref 6.4–8.2)
Total Bilirubin: 0.3 mg/dL (ref 0.3–1.2)

## 2013-09-18 LAB — URINALYSIS, COMPLETE
BILIRUBIN, UR: NEGATIVE
Bacteria: NONE SEEN
Blood: NEGATIVE
Glucose,UR: 150 mg/dL (ref 0–75)
Ketone: NEGATIVE
LEUKOCYTE ESTERASE: NEGATIVE
NITRITE: NEGATIVE
PH: 8 (ref 4.5–8.0)
Protein: 100
RBC,UR: <1 /HPF (ref 0–5)
SPECIFIC GRAVITY: 1.011 (ref 1.003–1.030)
SQUAMOUS EPITHELIAL: NONE SEEN
WBC UR: NONE SEEN /HPF (ref 0–5)

## 2013-09-18 LAB — PROTIME-INR
INR: 0.95 (ref 0.00–1.49)
INR: 1
PROTHROMBIN TIME: 12.5 s (ref 11.6–15.2)
PROTHROMBIN TIME: 12.7 s (ref 11.5–14.7)

## 2013-09-18 LAB — CBC
HCT: 27 % — ABNORMAL LOW (ref 40.0–52.0)
HGB: 8.9 g/dL — ABNORMAL LOW (ref 13.0–18.0)
MCH: 33.6 pg (ref 26.0–34.0)
MCHC: 33 g/dL (ref 32.0–36.0)
MCV: 102 fL — ABNORMAL HIGH (ref 80–100)
Platelet: 169 10*3/uL (ref 150–440)
RBC: 2.65 10*6/uL — AB (ref 4.40–5.90)
RDW: 15.1 % — ABNORMAL HIGH (ref 11.5–14.5)
WBC: 3.7 10*3/uL — AB (ref 3.8–10.6)

## 2013-09-18 LAB — CK TOTAL AND CKMB (NOT AT ARMC)
CK, Total: 62 U/L
CK-MB: 2 ng/mL (ref 0.5–3.6)

## 2013-09-18 LAB — TYPE AND SCREEN
ABO/RH(D): B POS
ANTIBODY SCREEN: NEGATIVE

## 2013-09-18 LAB — HEMOGLOBIN: HGB: 7.8 g/dL — ABNORMAL LOW (ref 13.0–18.0)

## 2013-09-18 LAB — APTT: aPTT: 38 seconds — ABNORMAL HIGH (ref 24–37)

## 2013-09-18 LAB — LIPASE, BLOOD: Lipase: 440 U/L — ABNORMAL HIGH (ref 73–393)

## 2013-09-18 LAB — PHOSPHORUS: Phosphorus: 3.2 mg/dL (ref 2.5–4.9)

## 2013-09-18 LAB — TROPONIN I
Troponin I: 0.35 ng/mL (ref <0.30)
Troponin-I: 0.12 ng/mL — ABNORMAL HIGH

## 2013-09-18 MED ORDER — VANCOMYCIN HCL 500 MG IV SOLR
500.0000 mg | INTRAVENOUS | Status: DC
  Filled 2013-09-18: qty 500

## 2013-09-18 MED ORDER — GUAIFENESIN ER 600 MG PO TB12
600.0000 mg | ORAL_TABLET | Freq: Two times a day (BID) | ORAL | Status: DC
  Administered 2013-09-18 – 2013-09-21 (×5): 600 mg via ORAL
  Filled 2013-09-18 (×7): qty 1

## 2013-09-18 MED ORDER — ONDANSETRON HCL 4 MG/2ML IJ SOLN
4.0000 mg | Freq: Four times a day (QID) | INTRAMUSCULAR | Status: DC | PRN
  Administered 2013-09-20: 4 mg via INTRAVENOUS
  Filled 2013-09-18: qty 2

## 2013-09-18 MED ORDER — SUCRALFATE 1 GM/10ML PO SUSP
1.0000 g | Freq: Three times a day (TID) | ORAL | Status: DC
  Administered 2013-09-19 – 2013-09-21 (×8): 1 g via ORAL
  Filled 2013-09-18 (×12): qty 10

## 2013-09-18 MED ORDER — PANTOPRAZOLE SODIUM 40 MG IV SOLR
40.0000 mg | Freq: Two times a day (BID) | INTRAVENOUS | Status: DC
  Administered 2013-09-18 – 2013-09-20 (×5): 40 mg via INTRAVENOUS
  Filled 2013-09-18 (×7): qty 40

## 2013-09-18 MED ORDER — PIPERACILLIN-TAZOBACTAM IN DEX 2-0.25 GM/50ML IV SOLN
2.2500 g | Freq: Three times a day (TID) | INTRAVENOUS | Status: DC
  Administered 2013-09-19 – 2013-09-21 (×8): 2.25 g via INTRAVENOUS
  Filled 2013-09-18 (×11): qty 50

## 2013-09-18 MED ORDER — SODIUM CHLORIDE 0.9 % IJ SOLN
3.0000 mL | Freq: Two times a day (BID) | INTRAMUSCULAR | Status: DC
  Administered 2013-09-18 – 2013-09-20 (×5): 3 mL via INTRAVENOUS

## 2013-09-18 MED ORDER — IPRATROPIUM-ALBUTEROL 0.5-2.5 (3) MG/3ML IN SOLN
3.0000 mL | Freq: Three times a day (TID) | RESPIRATORY_TRACT | Status: DC | PRN
  Administered 2013-09-19 – 2013-09-21 (×5): 3 mL via RESPIRATORY_TRACT
  Filled 2013-09-18 (×5): qty 3

## 2013-09-18 MED ORDER — VANCOMYCIN HCL 10 G IV SOLR
1250.0000 mg | Freq: Once | INTRAVENOUS | Status: AC
  Administered 2013-09-18: 1250 mg via INTRAVENOUS
  Filled 2013-09-18: qty 1250

## 2013-09-18 MED ORDER — IPRATROPIUM-ALBUTEROL 20-100 MCG/ACT IN AERS: 1.0000 | INHALATION_SPRAY | Freq: Two times a day (BID) | RESPIRATORY_TRACT | Status: DC | PRN

## 2013-09-18 MED ORDER — ONDANSETRON HCL 4 MG PO TABS: 4.0000 mg | ORAL_TABLET | Freq: Four times a day (QID) | ORAL | Status: DC | PRN

## 2013-09-18 NOTE — Progress Notes (Signed)
Gerald Cooke [id number] Code Status: Full   Admission Data: 09/18/2013 6:56 PM Attending Provider:  Charlies Silvers ZHG:[id number] AZBELL, MD Consults/ Treatment Team:    Gerald Cooke is a 73 y.o. male patient admitted from ED awake, alert - oriented  X 3 - no acute distress noted.  VSS - There were no vitals taken for this visit.  no c/o shortness of breath, no c/o chest pain.  IV Fluids:  IV in place, occlusive dsg intact without redness, IV cath upper arm right, condition patent and no redness none.  Allergies:   Allergies  Allergen Reactions  . 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 Shellfish Allergy Other (See Comments)    Throat swelled/ choking  . Tetanus Toxoids Swelling    Arm swelled up     Past Medical History  Diagnosis Date  . Colostomy in place   . Abdominal aortic aneurysm 02/2007    Ruptured, hospitalized at [facility] for 52 days, c/b loss of colon and renal function. Followed by Dr Ronalee Belts  . Hyperlipidemia   . Atherosclerosis   . Esophageal reflux   . Hypertension   . PSVT (paroxysmal supraventricular tachycardia)   . Aortic stenosis   . Lung cancer 2009    Initial clinical stage IIIB - treated with radiation + chemotherapy  . Heart murmur   . ESRD (end stage renal disease) on dialysis     "[facility]; [place]; [place], Wed & Fri,  Dr Holley Raring" (08/24/2013)  . Exertional shortness of breath   . Anemia   . History of blood transfusion 2008    "related to ruptured AAA" (08/24/2013)  . Arthritis     "fingers"   . GERD (gastroesophageal reflux disease)   . Chronic diastolic congestive heart failure   . S/P radiation therapy > 12 wks ago   . S/P chemotherapy, time since greater than 12 weeks   . 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 Hypoalbuminemia   . Anemia in chronic kidney disease   . GI  (gastrointestinal bleed)   . Heparin allergy   . Non-STEMI (non-ST elevated myocardial infarction)   . Malnutrition    Medications Prior to Admission  Medication Sig Dispense Refill  . amLODipine (NORVASC) 2.5 MG tablet Take 1 tablet (2.5 mg total) by mouth daily.  30 tablet  6  . aspirin EC 81 MG tablet Take 81 mg by mouth daily.      Marland Kitchen atorvastatin (LIPITOR) 40 MG tablet Take 40 mg by mouth daily.       . Ipratropium-Albuterol (COMBIVENT RESPIMAT) 20-100 MCG/ACT AERS respimat Inhale 1 puff into the lungs 2 (two) times daily as needed for wheezing or shortness of breath.       . lactose free nutrition (BOOST) LIQD Take 237 mLs by mouth daily as needed (protein supplement).      . metoprolol tartrate (LOPRESSOR) 25 MG tablet Take 25 mg by mouth See admin instructions. Take 1 tablet (25 mg) every morning, may take an additional tablet in the evening if blood pressure is over 160      . ondansetron (ZOFRAN) 8 MG tablet Take 8 mg by mouth 2 (two) times daily as needed for nausea or vomiting.      . pantoprazole (PROTONIX) 40 MG tablet Take 1 tablet (40 mg total) by mouth 2 (two) times daily.  60 tablet  1  . sevelamer (RENVELA) 800 MG tablet Take 800-2,400 mg by mouth See admin instructions. Take 1-2 tablets [phone] mg) with snacks, and 3 tablets (2400 mg) with meals      . sucralfate (CARAFATE) 1 GM/10ML suspension Take 1 g by mouth See admin instructions. Take 10 mls (2 grams) twice daily, may take an additional dose at bedtime if needed for acid indigestion      . Tetrahydrozoline HCl (VISINE OP) Place 1 drop into both eyes 2 (two) times daily as needed (blurriness/ dry eyes).         Orientation to room, and floor completed with information packet given to patient/family. Admission INP armband ID verified with patient/family, and in place.   SR up x 2, fall assessment complete, with patient and family able to verbalize understanding of risk associated with falls, and verbalized understanding  to call nsg before up out of bed.  Call light within reach, patient able to voice, and demonstrate understanding.     Will cont to eval and treat per MD orders.  Henriette Combs, [place] 09/18/2013 6:56 PM

## 2013-09-18 NOTE — H&P (Signed)
[facility] History and Physical  Patient: Gerald Cooke  CZY:[id number]  DOB: 09-13-1940  DOS: the patient was seen and examined on 09/18/2013 PCP: Rica Mast, MD  Chief Complaint: melena  HPI: Gerald Cooke is a 73 y.o. male with Past medical history of lung cancer, s/p radiation and chemotherapy, hypertension, severe aortic stenosis planned TAVR, NSTEMI, ESRD on HD MWF, radiation esophagitis. The patient is coming from home. The patient presented with a two-day history of melena from his colostomy. He had 4-5 episodes of black color loose bowel movements. Earlier this morning he also had an episode of vomiting which was non-bloody or non-coffee-ground. He complains of some acid reflux but denies any abdominal pain. He denies any recent change in his medications. Patient also denies any complaints of dizziness, chest pain, shortness of breath, nausea or vomiting at this point, rash, focal neurological deficit. He presented with this complaint to [facility] last night with hemoglobin of 8.9, repeat hemoglobin has 7.8 and he received one unit of blood transfusion after the dialysis. He has history of ESRD and has completed a full HD treatment today. Patient was transferred to [facility] cone due to his history of coronary artery disease. He also complains of cough with greenish expectoration present since last 2 weeks.his cough is progressively worsening.  Review of Systems: as mentioned in the history of present illness.  A Comprehensive review of the other systems is negative.  Past Medical History  Diagnosis Date  . Colostomy in place   . Abdominal aortic aneurysm 02/2007    Ruptured, hospitalized at [facility] for 52 days, c/b loss of colon and renal function. Followed by Dr Ronalee Belts  . Hyperlipidemia   . Atherosclerosis   . Esophageal reflux   . Hypertension   . PSVT (paroxysmal supraventricular tachycardia)   . Aortic stenosis   . Lung cancer 2009    Initial clinical stage IIIB  - treated with radiation + chemotherapy  . Heart murmur   . ESRD (end stage renal disease) on dialysis     "[facility]; [place]; [place], Wed & Fri,  Dr Holley Raring" (08/24/2013)  . Exertional shortness of breath   . Anemia   . History of blood transfusion 2008    "related to ruptured AAA" (08/24/2013)  . Arthritis     "fingers"   . GERD (gastroesophageal reflux disease)   . Chronic diastolic congestive heart failure   . S/P radiation therapy > 12 wks ago   . S/P chemotherapy, time since greater than 12 weeks   . 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 Hypoalbuminemia   . Anemia in chronic kidney disease   . GI (gastrointestinal bleed)   . Heparin allergy   . Non-STEMI (non-ST elevated myocardial infarction)   . Malnutrition    Past Surgical History  Procedure Laterality Date  . Colostomy  03/17/2007    end colostomy of right colon near hepatic flexure  . Left colectomy  03/17/2007    extended left colectomy and proctectomy for ischemic colitis after emergency repair of ruptured AAA  . Abdominal aortic aneurysm repair  03/14/2007    [facility]:  emergency repair for ruptured AAA  . Arteriovenous graft placement Left 2008    "arm"  . Pilonidal cyst / sinus excision    . Cataract extraction w/ intraocular lens implant Right   . Esophagogastroduodenoscopy Left 08/27/2013    Procedure: ESOPHAGOGASTRODUODENOSCOPY (EGD);  Surgeon: Arta Silence, MD;  Location: [facility] ENDOSCOPY;  Service: Endoscopy;  Laterality: Left;   Social  History:  reports that he has quit smoking. His smoking use included Cigarettes. He has a 50 pack-year smoking history. He has never used smokeless tobacco. He reports that he drinks alcohol. He reports that he does not use illicit drugs. Independent for most of his  ADL.  Allergies  Allergen Reactions  . Heparin     Patient had prolonged thrombocytopenia following emergency repair of ruptured AAA in 2008 leading to concerns regarding possible HIT, although no lab verification  and the patient recovered   . Shellfish Allergy Other (See Comments)    Throat swelled/ choking  . Tetanus Toxoids Swelling    Arm swelled up    No family history on file.  Prior to Admission medications   Medication Sig Start Date End Date Taking? Authorizing Provider  amLODipine (NORVASC) 2.5 MG tablet Take 1 tablet (2.5 mg total) by mouth daily. 08/29/13  Yes Cecilie Kicks, NP  aspirin EC 81 MG tablet Take 81 mg by mouth daily.   Yes Historical Provider, MD  atorvastatin (LIPITOR) 40 MG tablet Take 40 mg by mouth daily.  06/04/13  Yes Historical Provider, MD  Ipratropium-Albuterol (COMBIVENT RESPIMAT) 20-100 MCG/ACT AERS respimat Inhale 1 puff into the lungs 2 (two) times daily as needed for wheezing or shortness of breath.    Yes Historical Provider, MD  lactose free nutrition (BOOST) LIQD Take 237 mLs by mouth daily as needed (protein supplement).   Yes Historical Provider, MD  metoprolol tartrate (LOPRESSOR) 25 MG tablet Take 25 mg by mouth See admin instructions. Take 1 tablet (25 mg) every morning, may take an additional tablet in the evening if blood pressure is over 160 09/23/12  Yes Wellington Hampshire, MD  ondansetron (ZOFRAN) 8 MG tablet Take 8 mg by mouth 2 (two) times daily as needed for nausea or vomiting.   Yes Historical Provider, MD  pantoprazole (PROTONIX) 40 MG tablet Take 1 tablet (40 mg total) by mouth 2 (two) times daily. 08/29/13  Yes Cecilie Kicks, NP  sevelamer (RENVELA) 800 MG tablet Take 800-2,400 mg by mouth See admin instructions. Take 1-2 tablets [phone] mg) with snacks, and 3 tablets (2400 mg) with meals   Yes Historical Provider, MD  sucralfate (CARAFATE) 1 GM/10ML suspension Take 1 g by mouth See admin instructions. Take 10 mls (2 grams) twice daily, may take an additional dose at bedtime if needed for acid indigestion   Yes Historical Provider, MD  Tetrahydrozoline HCl (VISINE OP) Place 1 drop into both eyes 2 (two) times daily as needed (blurriness/ dry eyes).   Yes  Historical Provider, MD    Physical Exam: Filed Vitals:   09/18/13 1903  BP: 92/52  Pulse: 111  Temp: 99.7 F (37.6 C)  TempSrc: Oral  Resp: 18  SpO2: 94%    General: Alert, Awake and Oriented to Time, Place and Person. Appear in mild distress Eyes: PERRL ENT: Oral Mucosa clear moist. Neck: no JVD Cardiovascular: S1 and S2 Present, aortic systolic Murmur radiating to carotid, Peripheral Pulses Present Respiratory: Bilateral Air entry equal and Decreased, bilateral basal Crackles, no wheezes Abdomen: Bowel Sound Present, Soft and Non tender Skin: No Rash Extremities: No Pedal edema, no calf tenderness Neurologic: Grossly Unremarkable. Labs on Admission:  CBC:  Recent Labs Lab 09/12/13 0415 09/13/13 1210  WBC 5.6 7.5  HGB 9.3* 10.5*  HCT 29.2* 32.5*  MCV 104.7* 104.8*  PLT 204 222    CMP     Component Value Date/Time   NA 138 09/12/2013 0415  K 4.5 09/12/2013 0415   CL 98 09/12/2013 0415   CO2 28 09/12/2013 0415   GLUCOSE 92 09/12/2013 0415   BUN 31* 09/12/2013 0415   CREATININE 4.05* 09/12/2013 0415   CALCIUM 9.0 09/12/2013 0415   PROT 7.3 09/10/2013 2224   ALBUMIN 3.4* 09/10/2013 2224   AST 11 09/10/2013 2224   ALT 9 09/10/2013 2224   ALKPHOS 80 09/10/2013 2224   BILITOT 0.3 09/10/2013 2224   GFRNONAA 14* 09/12/2013 0415   GFRAA 16* 09/12/2013 0415    No results found for this basename: LIPASE, AMYLASE,  in the last 168 hours No results found for this basename: AMMONIA,  in the last 168 hours  No results found for this basename: CKTOTAL, CKMB, CKMBINDEX, TROPONINI,  in the last 168 hours BNP (last 3 results)  Recent Labs  08/25/13 0305  PROBNP 42409.0*    Radiological Exams on Admission: No results found.   Assessment/Plan Principal Problem:   UGIB (upper gastrointestinal bleed) Active Problems:   Hypertension   Hyperlipidemia   Severe aortic stenosis, plan for TAVR, no need for CABG, by cath/IVUS   ESRD (end stage renal disease) on dialysis, MWF    Lung cancer   Radiation esophagitis   1. UGIB (upper gastrointestinal bleed) Patient has history of radiation esophagitis  The patient is presenting with complaints of melena. He had anemia with hemoglobin dropping to 7.8 from 10.5. He has received one unit of blood transfusion and currently transferred here to [facility] cone. I have discussed the case with on-call GI, recommend to keep the patient on clear liquid diet, IV Protonix twice a day, follow H&H every 6 hours, they will follow in the morning. At present holding aspirin. Continue Carafate.  2. Severe aortic stenosis history of nonobstructive coronary artery disease The patient had elevated troponin in [facility], would follow serial troponins. Due to low blood pressure we will hold antihypertensives. May need blood transfusion if hemoglobin is low or blood pressure is low. Monitor on telemetry  3. ESRD on hemodialysis Received hemodialysis on Friday Continue monitor avoid nephrotoxic medications  4. Lung cancer Status post chemotherapy and radiation in the past Recent chest x-ray done at [facility] shows increasing opacity in the right lesion persisting possible infection, along with that the patient has chronic cough since last 2 weeks with yellow-green expectoration, I would treat him with IV Zosyn and vancomycin for possible pneumonia/ postobstructive pneumonia. Continue to monitor.  Consults: [facility]  DVT Prophylaxis: mechanical compression device Nutrition: Clear liquid diet  Code Status: Full  Disposition: Admitted to inpatient in telemetry unit.  Author: Berle Mull, MD [facility] Pager: [phone] 09/18/2013, 8:15 PM    If 7PM-7AM, please contact night-coverage [email or web address] Password [email or web address]

## 2013-09-18 NOTE — Progress Notes (Signed)
ANTIBIOTIC CONSULT NOTE - INITIAL  Pharmacy Consult for vancomycin and zosyn Indication: pneumonia  Allergies  Allergen Reactions  . 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 Shellfish Allergy Other (See Comments)    Throat swelled/ choking  . Tetanus Toxoids Swelling    Arm swelled up    Patient Measurements: Height: 5' 6.14" (168 cm) Weight: 129 lb 3 oz (58.6 kg) IBW/kg (Calculated) : 64.13   Vital Signs: Temp: 99.7 F (37.6 C) (02/27 1903) Temp src: Oral (02/27 1903) BP: 92/52 mmHg (02/27 1903) Pulse Rate: 111 (02/27 1903) Intake/Output from previous day:   Intake/Output from this shift:    Labs:  Recent Labs  09/18/13 2002  WBC 3.5*  HGB 10.0*  PLT 163  CREATININE 3.83*   Estimated Creatinine Clearance: 14.7 ml/min (by C-G formula based on Cr of 3.83). No results found for this basename: VANCOTROUGH, VANCOPEAK, VANCORANDOM, GENTTROUGH, GENTPEAK, GENTRANDOM, TOBRATROUGH, TOBRAPEAK, TOBRARND, AMIKACINPEAK, AMIKACINTROU, AMIKACIN,  in the last 72 hours   Microbiology: No results found for this or any previous visit (from the past 720 hour(s)).  Medical History: Past Medical History  Diagnosis Date  . Colostomy in place   . Abdominal aortic aneurysm 02/2007    Ruptured, hospitalized at [facility] for 52 days, c/b loss of colon and renal function. Followed by Dr Ronalee Belts  . Hyperlipidemia   . Atherosclerosis   . Esophageal reflux   . Hypertension   . PSVT (paroxysmal supraventricular tachycardia)   . Aortic stenosis   . Lung cancer 2009    Initial clinical stage IIIB - treated with radiation + chemotherapy  . Heart murmur   . ESRD (end stage renal disease) on dialysis     "[facility]; [place]; [place], Wed & Fri,  Dr Holley Raring" (08/24/2013)  . Exertional shortness of breath   . Anemia   . History of blood  transfusion 2008    "related to ruptured AAA" (08/24/2013)  . Arthritis     "fingers"   . GERD (gastroesophageal reflux disease)   . Chronic diastolic congestive heart failure   . S/P radiation therapy > 12 wks ago   . S/P chemotherapy, time since greater than 12 weeks   . 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 Hypoalbuminemia   . Anemia in chronic kidney disease   . GI (gastrointestinal bleed)   . Heparin allergy   . Non-STEMI (non-ST elevated myocardial infarction)   . Malnutrition    Assessment: 73 year old male with history of lung cancer, s/p radiation and chemotherapy, hypertension, severe aortic stenosis planned TAVR, NSTEMI, ESRD on HD MWF, radiation esophagitis. He has a chief complaint of melena, received complete HD session earlier today and upon CXR examination it appears that he has possible pneumonia. Orders to start empiric vancomycin and zosyn. No fevers noted and wbc is low at 3.5.  Goal of Therapy:  Pre-HD vancomycin level of 15-25  Plan:  1.Vancomycin 1250mg  IV x1 now then 500mg  qHD session - MWF 2.Zosyn 2.25g IV q8 hours 3.Follow fever curve, wbc count, and any culture data  Erin Hearing PharmD., BCPS Clinical Pharmacist Pager [phone] 09/18/2013 9:04 PM

## 2013-09-19 ENCOUNTER — Other Ambulatory Visit: Payer: Self-pay

## 2013-09-19 ENCOUNTER — Encounter (HOSPITAL_COMMUNITY): Payer: Self-pay | Admitting: *Deleted

## 2013-09-19 DIAGNOSIS — D62 Acute posthemorrhagic anemia: Secondary | ICD-10-CM

## 2013-09-19 DIAGNOSIS — I10 Essential (primary) hypertension: Secondary | ICD-10-CM

## 2013-09-19 DIAGNOSIS — I359 Nonrheumatic aortic valve disorder, unspecified: Secondary | ICD-10-CM

## 2013-09-19 DIAGNOSIS — K922 Gastrointestinal hemorrhage, unspecified: Secondary | ICD-10-CM

## 2013-09-19 LAB — COMPREHENSIVE METABOLIC PANEL
ALT: 12 U/L (ref 0–53)
AST: 16 U/L (ref 0–37)
Albumin: 2.6 g/dL — ABNORMAL LOW (ref 3.5–5.2)
Alkaline Phosphatase: 61 U/L (ref 39–117)
BUN: 36 mg/dL — ABNORMAL HIGH (ref 6–23)
CALCIUM: 8.5 mg/dL (ref 8.4–10.5)
CO2: 30 mEq/L (ref 19–32)
Chloride: 101 mEq/L (ref 96–112)
Creatinine, Ser: 4.25 mg/dL — ABNORMAL HIGH (ref 0.50–1.35)
GFR calc Af Amer: 15 mL/min — ABNORMAL LOW (ref >90)
GFR calc non Af Amer: 13 mL/min — ABNORMAL LOW (ref >90)
Glucose, Bld: 80 mg/dL (ref 70–99)
Potassium: 4.9 mEq/L (ref 3.7–5.3)
SODIUM: 141 meq/L (ref 137–147)
TOTAL PROTEIN: 6 g/dL (ref 6.0–8.3)
Total Bilirubin: 0.4 mg/dL (ref 0.3–1.2)

## 2013-09-19 LAB — CBC
HCT: 28.9 % — ABNORMAL LOW (ref 39.0–52.0)
HEMATOCRIT: 28.8 % — AB (ref 39.0–52.0)
HEMATOCRIT: 30.6 % — AB (ref 39.0–52.0)
HEMOGLOBIN: 9.6 g/dL — AB (ref 13.0–17.0)
HEMOGLOBIN: 9.4 g/dL — AB (ref 13.0–17.0)
Hemoglobin: 10.1 g/dL — ABNORMAL LOW (ref 13.0–17.0)
MCH: 32.2 pg (ref 26.0–34.0)
MCH: 32 pg (ref 26.0–34.0)
MCH: 32.6 pg (ref 26.0–34.0)
MCHC: 33.2 g/dL (ref 30.0–36.0)
MCHC: 32.6 g/dL (ref 30.0–36.0)
MCHC: 33 g/dL (ref 30.0–36.0)
MCV: 97 fL (ref 78.0–100.0)
MCV: 98 fL (ref 78.0–100.0)
MCV: 98.7 fL (ref 78.0–100.0)
PLATELETS: 150 10*3/uL (ref 150–400)
Platelets: 148 10*3/uL — ABNORMAL LOW (ref 150–400)
Platelets: 144 10*3/uL — ABNORMAL LOW (ref 150–400)
RBC: 2.98 MIL/uL — ABNORMAL LOW (ref 4.22–5.81)
RBC: 2.94 MIL/uL — AB (ref 4.22–5.81)
RBC: 3.1 MIL/uL — AB (ref 4.22–5.81)
RDW: 17.8 % — ABNORMAL HIGH (ref 11.5–15.5)
RDW: 18 % — ABNORMAL HIGH (ref 11.5–15.5)
RDW: 18 % — ABNORMAL HIGH (ref 11.5–15.5)
WBC: 3.5 10*3/uL — ABNORMAL LOW (ref 4.0–10.5)
WBC: 2.7 10*3/uL — ABNORMAL LOW (ref 4.0–10.5)
WBC: 3.1 10*3/uL — ABNORMAL LOW (ref 4.0–10.5)

## 2013-09-19 LAB — TROPONIN I
TROPONIN I: 0.34 ng/mL — AB (ref <0.30)
Troponin I: 0.42 ng/mL (ref <0.30)
Troponin I: 0.31 ng/mL (ref <0.30)

## 2013-09-19 MED ORDER — SEVELAMER CARBONATE 800 MG PO TABS
2400.0000 mg | ORAL_TABLET | Freq: Three times a day (TID) | ORAL | Status: DC
  Administered 2013-09-19 – 2013-09-21 (×5): 2400 mg via ORAL
  Filled 2013-09-19 (×9): qty 3

## 2013-09-19 MED ORDER — SEVELAMER CARBONATE 800 MG PO TABS: 800.0000 mg | ORAL_TABLET | ORAL | Status: DC

## 2013-09-19 MED ORDER — SEVELAMER CARBONATE 800 MG PO TABS
800.0000 mg | ORAL_TABLET | ORAL | Status: DC | PRN
  Filled 2013-09-19: qty 2

## 2013-09-19 NOTE — Progress Notes (Signed)
Pt's 2nd troponin .42 - on call physician notified Rogue Bussing). Rogue Bussing states he notified cardiology, and they will not do anything unless it is above 1. Stat and 0800 EKG ordered. Pt in no s/s of distress - no complaints of chest pain.

## 2013-09-19 NOTE — Consult Note (Signed)
[facility] Consult Note  Referring Provider: No ref. provider found Primary Care Physician:  Rica Mast, MD Primary Gastroenterologist:  Dr.  Laurel Dimmer Complaint: Dark stool and ostomy but HPI: Gerald Cooke is an 73 y.o. white male  with a history of severe aortic stenosis end-stage renal disease, lung cancer status post radiation treatment and multiple other medical problems transferred from [facility] do to recurrent dark stool in his ostomy bag. 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 Endoscopy was not performed on the second admission. His last hemoglobin here was 10.5 and on presentation here was 10.0. I believe he received one unit blood transfusion prior to his transfer. Currently his ostomy bag is empty.  Past Medical History  Diagnosis Date  . Colostomy in place   . Abdominal aortic aneurysm 02/2007    Ruptured, hospitalized at [facility] for 52 days, c/b loss of colon and renal function. Followed by Dr Ronalee Belts  . Hyperlipidemia   . Atherosclerosis   . Esophageal reflux   . Hypertension   . PSVT (paroxysmal supraventricular tachycardia)   . Aortic stenosis   . Lung cancer 2009    Initial clinical stage IIIB - treated with radiation + chemotherapy  . Heart murmur   . ESRD (end stage renal disease) on dialysis     "[facility]; [place]; [place], Wed & Fri,  Dr Holley Raring" (08/24/2013)  . Exertional shortness of breath   . Anemia   . History of blood transfusion 2008    "related to ruptured AAA" (08/24/2013)  . Arthritis     "fingers"   . GERD (gastroesophageal reflux disease)   . Chronic diastolic congestive heart failure   . S/P radiation therapy > 12 wks ago   . S/P chemotherapy, time since greater than 12 weeks   . 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 Hypoalbuminemia   . Anemia in chronic kidney disease   . GI (gastrointestinal bleed)   . Heparin  allergy   . Non-STEMI (non-ST elevated myocardial infarction)   . Malnutrition     Past Surgical History  Procedure Laterality Date  . Colostomy  03/17/2007    end colostomy of right colon near hepatic flexure  . Left colectomy  03/17/2007    extended left colectomy and proctectomy for ischemic colitis after emergency repair of ruptured AAA  . Abdominal aortic aneurysm repair  03/14/2007    [facility]:  emergency repair for ruptured AAA  . Arteriovenous graft placement Left 2008    "arm"  . Pilonidal cyst / sinus excision    . Cataract extraction w/ intraocular lens implant Right   . Esophagogastroduodenoscopy Left 08/27/2013    Procedure: ESOPHAGOGASTRODUODENOSCOPY (EGD);  Surgeon: Arta Silence, MD;  Location: [facility] ENDOSCOPY;  Service: Endoscopy;  Laterality: Left;    Medications Prior to Admission  Medication Sig Dispense Refill  . amLODipine (NORVASC) 2.5 MG tablet Take 1 tablet (2.5 mg total) by mouth daily.  30 tablet  6  . aspirin EC 81 MG tablet Take 81 mg by mouth daily.      Marland Kitchen atorvastatin (LIPITOR) 40 MG tablet Take 40 mg by mouth daily.       . Ipratropium-Albuterol (COMBIVENT RESPIMAT) 20-100 MCG/ACT AERS respimat Inhale 1 puff into the lungs 2 (two) times daily as needed for wheezing or shortness of breath.       . lactose free nutrition (BOOST) LIQD Take 237 mLs by mouth  daily as needed (protein supplement).      . metoprolol tartrate (LOPRESSOR) 25 MG tablet Take 25 mg by mouth See admin instructions. Take 1 tablet (25 mg) every morning, may take an additional tablet in the evening if blood pressure is over 160      . ondansetron (ZOFRAN) 8 MG tablet Take 8 mg by mouth 2 (two) times daily as needed for nausea or vomiting.      . pantoprazole (PROTONIX) 40 MG tablet Take 1 tablet (40 mg total) by mouth 2 (two) times daily.  60 tablet  1  . sevelamer (RENVELA) 800 MG tablet Take 800-2,400 mg by mouth See admin instructions. Take 1-2 tablets ([phone] mg) with snacks, and 3  tablets (2400 mg) with meals      . sucralfate (CARAFATE) 1 GM/10ML suspension Take 1 g by mouth See admin instructions. Take 10 mls (2 grams) twice daily, may take an additional dose at bedtime if needed for acid indigestion      . Tetrahydrozoline HCl (VISINE OP) Place 1 drop into both eyes 2 (two) times daily as needed (blurriness/ dry eyes).        Allergies:  Allergies  Allergen Reactions  . 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 Shellfish Allergy Other (See Comments)    Throat swelled/ choking  . Tetanus Toxoids Swelling    Arm swelled up    No family history on file.  Social History:  reports that he has quit smoking. His smoking use included Cigarettes. He has a 50 pack-year smoking history. He has never used smokeless tobacco. He reports that he drinks alcohol. He reports that he does not use illicit drugs.  Review of Systems: negative except as per above   Blood pressure 102/81, pulse 78, temperature 97.9 F (36.6 C), temperature source Oral, resp. rate 18, height 5' 6.14" (1.68 m), weight 58.6 kg (129 lb 3 oz), SpO2 98.00%. Head: Normocephalic, without obvious abnormality, atraumatic Neck: no adenopathy, no carotid bruit, no JVD, supple, symmetrical, trachea midline and thyroid not enlarged, symmetric, no tenderness/mass/nodules Resp: clear to auscultation bilaterally Cardio: regular rate and rhythm, S1, S2 normal, no murmur, click, rub or gallop GI: Abdomen soft nondistended with normoactive bowel sounds. No hepatosplenomegaly mass or guarding. Extremities: extremities normal, atraumatic, no cyanosis or edema  Results for orders placed during the hospital encounter of 09/18/13 (from the past 48 hour(s))  CBC WITH DIFFERENTIAL     Status: Abnormal   Collection Time    09/18/13  8:02 PM      Result Value Ref  Range   WBC 3.5 (*) 4.0 - 10.5 K/uL   RBC 3.10 (*) 4.22 - 5.81 MIL/uL   Hemoglobin 10.0 (*) 13.0 - 17.0 g/dL   HCT 30.2 (*) 39.0 - 52.0 %   MCV 97.4  78.0 - 100.0 fL   MCH 32.3  26.0 - 34.0 pg   MCHC 33.1  30.0 - 36.0 g/dL   RDW 17.8 (*) 11.5 - 15.5 %   Platelets 163  150 - 400 K/uL   Neutrophils Relative % 78 (*) 43 - 77 %   Neutro Abs 2.7  1.7 - 7.7 K/uL   Lymphocytes Relative 9 (*) 12 - 46 %   Lymphs Abs 0.3 (*) 0.7 - 4.0 K/uL   Monocytes Relative 12  3 - 12 %  Monocytes Absolute 0.4  0.1 - 1.0 K/uL   Eosinophils Relative 1  0 - 5 %   Eosinophils Absolute 0.0  0.0 - 0.7 K/uL   Basophils Relative 0  0 - 1 %   Basophils Absolute 0.0  0.0 - 0.1 K/uL  COMPREHENSIVE METABOLIC PANEL     Status: Abnormal   Collection Time    09/18/13  8:02 PM      Result Value Ref Range   Sodium 144  137 - 147 mEq/L   Potassium 4.2  3.7 - 5.3 mEq/L   Chloride 102  96 - 112 mEq/L   CO2 29  19 - 32 mEq/L   Glucose, Bld 135 (*) 70 - 99 mg/dL   BUN 30 (*) 6 - 23 mg/dL   Creatinine, Ser 3.83 (*) 0.50 - 1.35 mg/dL   Calcium 8.6  8.4 - 10.5 mg/dL   Total Protein 6.0  6.0 - 8.3 g/dL   Albumin 2.6 (*) 3.5 - 5.2 g/dL   AST 17  0 - 37 U/L   ALT 12  0 - 53 U/L   Alkaline Phosphatase 59  39 - 117 U/L   Total Bilirubin 0.3  0.3 - 1.2 mg/dL   GFR calc non Af Amer 15 (*) >90 mL/min   GFR calc Af Amer 17 (*) >90 mL/min   Comment: (NOTE)     The eGFR has been calculated using the CKD EPI equation.     This calculation has not been validated in all clinical situations.     eGFR's persistently <90 mL/min signify possible Chronic Kidney     Disease.  PROTIME-INR     Status: None   Collection Time    09/18/13  8:02 PM      Result Value Ref Range   Prothrombin Time 12.5  11.6 - 15.2 seconds   INR 0.95  0.00 - 1.49  TYPE AND SCREEN     Status: None   Collection Time    09/18/13  8:02 PM      Result Value Ref Range   ABO/RH(D) B POS     Antibody Screen NEG     Sample Expiration 09/21/2013    APTT      Status: Abnormal   Collection Time    09/18/13  8:02 PM      Result Value Ref Range   aPTT 38 (*) 24 - 37 seconds   Comment:            IF BASELINE aPTT IS ELEVATED,     SUGGEST PATIENT RISK ASSESSMENT     BE USED TO DETERMINE APPROPRIATE     ANTICOAGULANT THERAPY.  TROPONIN I     Status: Abnormal   Collection Time    09/18/13  8:02 PM      Result Value Ref Range   Troponin I 0.35 (*) <0.30 ng/mL   Comment:            Due to the release kinetics of cTnI,     a negative result within the first hours     of the onset of symptoms does not rule out     myocardial infarction with certainty.     If myocardial infarction is still suspected,     repeat the test at appropriate intervals.     CRITICAL RESULT CALLED TO, READ BACK BY AND VERIFIED WITH:     Sondra Barges [id number] 09/18/13 WBOND  COMPREHENSIVE METABOLIC PANEL     Status: Abnormal  Collection Time    09/19/13  1:12 AM      Result Value Ref Range   Sodium 141  137 - 147 mEq/L   Potassium 4.9  3.7 - 5.3 mEq/L   Chloride 101  96 - 112 mEq/L   CO2 30  19 - 32 mEq/L   Glucose, Bld 80  70 - 99 mg/dL   BUN 36 (*) 6 - 23 mg/dL   Creatinine, Ser 4.25 (*) 0.50 - 1.35 mg/dL   Calcium 8.5  8.4 - 10.5 mg/dL   Total Protein 6.0  6.0 - 8.3 g/dL   Albumin 2.6 (*) 3.5 - 5.2 g/dL   AST 16  0 - 37 U/L   ALT 12  0 - 53 U/L   Alkaline Phosphatase 61  39 - 117 U/L   Total Bilirubin 0.4  0.3 - 1.2 mg/dL   GFR calc non Af Amer 13 (*) >90 mL/min   GFR calc Af Amer 15 (*) >90 mL/min   Comment: (NOTE)     The eGFR has been calculated using the CKD EPI equation.     This calculation has not been validated in all clinical situations.     eGFR's persistently <90 mL/min signify possible Chronic Kidney     Disease.  CBC     Status: Abnormal   Collection Time    09/19/13  1:12 AM      Result Value Ref Range   WBC 3.5 (*) 4.0 - 10.5 K/uL   RBC 2.98 (*) 4.22 - 5.81 MIL/uL   Hemoglobin 9.6 (*) 13.0 - 17.0 g/dL   HCT 28.9 (*) 39.0 - 52.0 %   MCV  97.0  78.0 - 100.0 fL   MCH 32.2  26.0 - 34.0 pg   MCHC 33.2  30.0 - 36.0 g/dL   RDW 17.8 (*) 11.5 - 15.5 %   Platelets 148 (*) 150 - 400 K/uL  TROPONIN I     Status: Abnormal   Collection Time    09/19/13  1:12 AM      Result Value Ref Range   Troponin I 0.42 (*) <0.30 ng/mL   Comment:            Due to the release kinetics of cTnI,     a negative result within the first hours     of the onset of symptoms does not rule out     myocardial infarction with certainty.     If myocardial infarction is still suspected,     repeat the test at appropriate intervals.     CRITICAL VALUE NOTED.  VALUE IS CONSISTENT WITH PREVIOUSLY REPORTED AND CALLED VALUE.   No results found.  Assessment: 1. Recurrent low-grade GI bleeding, upper source suggested by appearance of melena, with radiation esophagitis and no other significant high-risk bleeding abnormality on previous EGD this month. Plan:  1. Consider repeat EGD but suspect would be relatively low yield at this point. His hemoglobin has remained relatively stable and I would opt for an an endoscopic management initially while monitoring stool and hemoglobin. 2. Will allow diet and recheck ostomy output and hemoglobin tomorrow. HAYES,JOHN C 09/19/2013, 9:06 AM

## 2013-09-19 NOTE — Progress Notes (Signed)
PATIENT DETAILS Name: Gerald Cooke Age: 73 y.o. Sex: male Date of Birth: 1940-09-13 Admit Date: 09/18/2013 Admitting Physician Robbie Lis, MD XAJ:OINOMV,EHMCNOBS AZBELL, MD  Subjective: No output in colostomy seen this morning. Apparently had.colostomy output yesterday.  Assessment/Plan: Principal Problem:   UGIB (upper gastrointestinal bleed) - Admitted with dark stool in ostomy. Required 1 unit of PRBC wide at [facility] by gastroenterology, plans are for observation and EGD as needed. 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 - Continue PPI -Continue to monitor CBC  Active Problems: Acute blood loss anemia - Per H&P, one at [place] hemoglobin had dropped to 7.8, patient subsequently received 1 unit of PRBC. - Currently hemoglobin stable at 9.4, we'll continue to monitor CBC daily. We'll transfuse as needed  Elevated troponins - Patient without chest pain or shortness of breath. Recently had a coronary angiography with intravascular ultrasound which showed nonobstructive left main plaque. Hold aspirin given possible GI bleeding - Suspect falsely elevated troponin secondary to end-stage renal disease  Suspected postobstructive pneumonia - On empiric vancomycin and Zosyn-day 2  Severe aortic stenosis - And followed by cardiology and cardiothoracic surgery-deemed to be a candidate for TVAR  End-stage renal disease - Hemodialysis on Mondays, Wednesdays and Fridays, and received his for hemodialysis session on Friday. We'll notify nephrology of patient's admission.  History of lung cancer - Is post chemotherapy/radiation in the past.  Hypertension - BP currently stable without need for antihypertensive medication  History of nonobstructive coronary artery disease - Had a recent left heart cardiac catheterization and an intravascular ultrasound-which showed nonobstructive disease in the left main. -  Resume aspirin when GI bleeding has stopped. Resume Lopressor when blood pressure stable.  Disposition: Remain inpatient  DVT Prophylaxis:  SCD's  Code Status: Full code   Family Communication None at bedside-pastor at bedside during this afternoon  Procedures:  None  CONSULTS:  GI  Time spent 40 minutes-which includes 50% of the time with face-to-face with patient/ family and coordinating care related to the above assessment and plan.    MEDICATIONS: Scheduled Meds: . guaiFENesin  600 mg Oral BID  . pantoprazole (PROTONIX) IV  40 mg Intravenous Q12H  . piperacillin-tazobactam (ZOSYN)  IV  2.25 g Intravenous 3 times per day  . sevelamer carbonate  2,400 mg Oral TID WC  . sodium chloride  3 mL Intravenous Q12H  . sucralfate  1 g Oral TID  . [START ON 09/21/2013] vancomycin  500 mg Intravenous Q M,W,F-HD   Continuous Infusions:  PRN Meds:.ipratropium-albuterol, ondansetron (ZOFRAN) IV, ondansetron, sevelamer carbonate  Antibiotics: Anti-infectives   Start     Dose/Rate Route Frequency Ordered Stop   09/21/13 1200  vancomycin (VANCOCIN) 500 mg in sodium chloride 0.9 % 100 mL IVPB     500 mg 100 mL/hr over 60 Minutes Intravenous Every M-W-F (Hemodialysis) 09/18/13 2056     09/18/13 2300  vancomycin (VANCOCIN) 1,250 mg in sodium chloride 0.9 % 250 mL IVPB     1,250 mg 166.7 mL/hr over 90 Minutes Intravenous  Once 09/18/13 2056 09/19/13 0103   09/18/13 2200  piperacillin-tazobactam (ZOSYN) IVPB 2.25 g     2.25 g 100 mL/hr over 30 Minutes Intravenous 3 times per day 09/18/13 2056         PHYSICAL EXAM: Vital signs in last 24 hours: Filed Vitals:   09/18/13 1903 09/18/13 2111 09/19/13 0523  BP: 92/52 112/68 102/81  Pulse:  111 100 78  Temp: 99.7 F (37.6 C) 100.4 F (38 C) 97.9 F (36.6 C)  TempSrc: Oral Oral Oral  Resp: 18 18 18   Height: 5' 6.14" (1.68 m)    Weight: 58.6 kg (129 lb 3 oz)  58.6 kg (129 lb 3 oz)  SpO2: 94% 96% 98%    Weight change:  Filed  Weights   09/18/13 1903 09/19/13 0523  Weight: 58.6 kg (129 lb 3 oz) 58.6 kg (129 lb 3 oz)   Body mass index is 20.76 kg/(m^2).   Gen Exam: Awake and alert with clear speech.   Neck: Supple, No JVD.   Chest: B/L Clear.   CVS: S1 S2 Regular, no murmurs.  Abdomen: soft, BS +, non tender, non distended. Colostomy in place-bag is empty. Extremities: no edema, lower extremities warm to touch Neurologic: Non Focal.   Skin: No Rash.   Wounds: N/A.    Intake/Output from previous day:  Intake/Output Summary (Last 24 hours) at 09/19/13 1429 Last data filed at 09/19/13 0900  Gross per 24 hour  Intake    354 ml  Output      0 ml  Net    354 ml     LAB RESULTS: CBC  Recent Labs Lab 09/13/13 1210 09/18/13 2002 09/19/13 0112 09/19/13 0825  WBC 7.5 3.5* 3.5* 2.7*  HGB 10.5* 10.0* 9.6* 9.4*  HCT 32.5* 30.2* 28.9* 28.8*  PLT 222 163 148* 144*  MCV 104.8* 97.4 97.0 98.0  MCH 33.9 32.3 32.2 32.0  MCHC 32.3 33.1 33.2 32.6  RDW 15.2 17.8* 17.8* 18.0*  LYMPHSABS  --  0.3*  --   --   MONOABS  --  0.4  --   --   EOSABS  --  0.0  --   --   BASOSABS  --  0.0  --   --     Chemistries   Recent Labs Lab 09/18/13 2002 09/19/13 0112  NA 144 141  K 4.2 4.9  CL 102 101  CO2 29 30  GLUCOSE 135* 80  BUN 30* 36*  CREATININE 3.83* 4.25*  CALCIUM 8.6 8.5    CBG:  Recent Labs Lab 09/15/13 1021  GLUCAP 85    GFR Estimated Creatinine Clearance: 13.2 ml/min (by C-G formula based on Cr of 4.25).  Coagulation profile  Recent Labs Lab 09/18/13 2002  INR 0.95    Cardiac Enzymes  Recent Labs Lab 09/19/13 0112 09/19/13 0825 09/19/13 1220  TROPONINI 0.42* 0.31* 0.34*    No components found with this basename: POCBNP,  No results found for this basename: DDIMER,  in the last 72 hours No results found for this basename: HGBA1C,  in the last 72 hours No results found for this basename: CHOL, HDL, LDLCALC, TRIG, CHOLHDL, LDLDIRECT,  in the last 72 hours No results found  for this basename: TSH, T4TOTAL, FREET3, T3FREE, THYROIDAB,  in the last 72 hours No results found for this basename: VITAMINB12, FOLATE, FERRITIN, TIBC, IRON, RETICCTPCT,  in the last 72 hours No results found for this basename: LIPASE, AMYLASE,  in the last 72 hours  Urine Studies No results found for this basename: UACOL, UAPR, USPG, UPH, UTP, UGL, UKET, UBIL, UHGB, UNIT, UROB, ULEU, UEPI, UWBC, URBC, UBAC, CAST, CRYS, UCOM, BILUA,  in the last 72 hours  MICROBIOLOGY: No results found for this or any previous visit (from the past 240 hour(s)).  RADIOLOGY STUDIES/RESULTS: Dg Chest 2 View  08/25/2013   CLINICAL DATA:  Shortness of Breath  EXAM: CHEST  2 VIEW  COMPARISON:  August 21, 2013  FINDINGS: There is persistent volume loss in the right upper lobe with a cavitary lesion which appear stable. There is mild scarring in the bases. There is calcification along both hemidiaphragms as well as along several pleural surfaces consistent with previous asbestos exposure. There is some localized pleural thickening in the left mid hemi thorax, stable. There is no new opacity. There is no edema or pulmonary venous hypertension. No cardiomegaly. There is a stent in the innominate vein regions.  IMPRESSION: No appreciable change from the most recent prior study. Cavitary lesion with a cicatrization in the right upper lobe, stable. Evidence of previous asbestos exposure. No frank edema or consolidation. No overt congestive heart failure.   Electronically Signed   By: Lowella Grip M.D.   On: 08/25/2013 08:05   Ct Angio Chest Pe W/cm &/or Wo Cm  08/25/2013   CLINICAL DATA:  Rule out pulmonary embolism, follow-up lung malignancy  EXAM: CT ANGIOGRAPHY CHEST WITH CONTRAST  TECHNIQUE: Multidetector CT imaging of the chest was performed using the standard protocol during bolus administration of intravenous contrast. Multiplanar CT image reconstructions including MIPs were obtained to evaluate the vascular anatomy.   CONTRAST:  14mL OMNIPAQUE IOHEXOL 350 MG/ML SOLN  COMPARISON:  DG CHEST 2 VIEW dated 08/25/2013; CT ANGIO CHEST dated 05/29/2013  FINDINGS: Contrast within the pulmonary arterial tree is normal in appearance. There are no filling defects to suggest an acute pulmonary embolism. The caliber of the thoracic aorta is normal. No false lumen is demonstrated. The cardiac chambers are mildly enlarged and stable. There is no pericardial or pleural effusion. Coronary artery calcifications are demonstrated. Calcifications of the aortic and mitral valve are stable. No bulky mediastinal or hilar lymph nodes are demonstrated. The caliber of the thoracic esophagus appears normal.  There is abnormal parenchymal consolidation in the right upper lobe consistent with a thick-walled cavitary lesion. Allowing for differences in positioning this has not significantly changed since the previous study. Density previously demonstrated in the posterior superior aspect of the right lower lobe has improved and increased interstitial density posteriorly in the left lower lobe is less prominent today.  Within the upper abdomen the observed portions of the liver and spleen appear normal. The right kidney is atrophic where visualized. Calcification of the visualized portions of the left adrenal gland is demonstrated. Calcification of the pleura over the hemidiaphragms is stable.  The observed portions of the ribs exhibit no acute abnormalities. The thoracic vertebral bodies are preserved in height with the exception of T5 which exhibits mild anterior wedging which is stable. There is prominent thoracic kyphosis.  Review of the MIP images confirms the above findings.  IMPRESSION: 1. There is no evidence of an acute pulmonary embolism or acute thoracic aortic pathology. 2. The small bilateral pleural effusions seen on the study of November 2014 have resolved. 3. There is stable increased density in the right upper lobe. Patchy interstitial density in  the lower lobes bilaterally has resolved. 4. Extensive pleural calcifications are present bilaterally.   Electronically Signed   By: David  Martinique   On: 08/25/2013 15:22   Nm Pet Image Initial (pi) Skull Base To Thigh  09/15/2013   CLINICAL DATA:  Subsequent treatment strategy for Lung cancer. Chronic cavitary lesion in the right upper lobe.  EXAM: NUCLEAR MEDICINE PET SKULL BASE TO THIGH  FASTING BLOOD GLUCOSE:  Value: 85 mg/dl  TECHNIQUE: 8.4 mCi F-18 FDG was injected intravenously. Full-ring  PET imaging was performed from the skull base to thigh after the radiotracer. CT data was obtained and used for attenuation correction and anatomic localization.  COMPARISON:  NM PET TUM IMG RESTAG (PS) SKULL BASE T - THIGH dated 06/11/2013; CT ANGIO CHEST AORTA W/CM & WO/CM dated 08/29/2013  FINDINGS: NECK  Mixed density right maxillary sinusitis. Cannot exclude a fungal component. There is also probably a component mucus retention cyst. And chronic right ethmoid sinusitis with partial right ethmoidectomy. Chronic right sphenoid sinusitis. No hypermetabolic adenopathy in the neck.  CHEST  Right upper lobe airspace opacity with cavitary process, maximum standard uptake value 3.6, formerly 4.9.  Severe atherosclerosis with cardiomegaly. Low-density blood pool suggests anemia. Stable trace left pleural effusion.  ABDOMEN/PELVIS  No abnormal hypermetabolic activity within the liver, pancreas, adrenal glands, or spleen. No hypermetabolic lymph nodes in the abdomen or pelvis. Focally high activity of the gastric cardia, maximum standard uptake value 10.6, without visible mass in this vicinity on the CT data. Dense calcifications along the hemidiaphragms and pleural surfaces compatible with asbestos related pleural disease. Stable irregular calcification in the posterior perirenal space on the left, without significant associated abnormal hypermetabolic activity.  Stable extensive atherosclerotic vascular disease. Atrophic  kidneys. Small amount of free pelvic fluid, significance uncertain, measuring a fluid density but with maximum standard uptake value of 3.2.  SKELETON  No focal hypermetabolic activity to suggest skeletal metastasis. Bilateral pars defects at L5.  IMPRESSION: 1. Reduced metabolic activity associated with the large right apical cavitary process, favoring inflammatory/infectious etiology over malignancy. 2. Hypermetabolic activity along the gastric cardia. This can be physiologic due to peristalsis, particularly given the lack of prior activity in this vicinity and the lack of discernible mass lesion on the CT data. If clinically warranted, endoscopy could be utilized for further characterization. 3. Severe atherosclerosis. 4. Right-sided sinusitis. Cannot exclude a fungal component of the right maxillary sinusitis. 5. Small amount of pelvic ascites, slightly hypermetabolic, but with fluid density. Appearance abnormal but nonspecific, may be due to inflammatory or neoplastic causes. Amount of fluid increased from 08/29/2013. 6. Chronic bilateral pars defects at L5.   Electronically Signed   By: Sherryl Barters M.D.   On: 09/15/2013 14:26   Ct Cardiac Morph/pulm Vein W/cm&w/o Ca Score  09/02/2013   ADDENDUM REPORT: 09/02/2013 19:20  CLINICAL DATA:  Aorticstenosis  EXAM: Cardiac TAVR CT  TECHNIQUE: The patient was scanned on a Philips 256 scanner. A 120 kV retrospective scan was triggered in the descending thoracic aorta at 111 HU's. Gantry rotation speed was 270 msecs and collimation was .9 mm. 10 mg of iv Metoprolol and no nitro was given. The 3D data set was reconstructed in 5% intervals of the R-R cycle. Systolic and diastolic phases were analyzed on a dedicated work station using MPR, MIP and VRT modes. The patient received 80 cc of contrast.  FINDINGS: Aortic Valve: Moderately thickened and severely calcified with limited but still somewhat preserved leaflet opening.  Aorta: Mild aortic calcifications in the  ascending and descending thoracic aorta and moderate calcifications in the aortic arch predominantly at the origin of the brachiocephalic arteries.  Sinotubular Junction:  29 x 29 mm  Ascending Thoracic Aorta:  32 x 31 mm  Aortic Arch:  27 x 27 mm  Descending Thoracic Aorta:  26 x 26 mm  Sinus of Valsalva Measurements:  Non-coronary:  34 mm  Right -coronary:  35 mm  Left -coronary:  35 mm  Coronary Artery Height above Annulus:  Left  Main:  11 mm  Right Coronary:  18 mm  Virtual Basal Annulus Measurements:  Maximum/Minimum Diameter:  25.7 x 29.6 mm  Perimeter:  91 mm  Area:  5.8 cm2  Coronary Arteries: Heavily calcified, the study is insufficient for coronary artery read, no nitro was given.  Optimum Fluoroscopic Angle for Delivery:  LAO 10, CAU 10.  Distance from annulus to sheath tip is 70 mm (>60 mm required for 29 mm valve).  IMPRESSION: 1. Severely calcified aortic valve with measurement suitable for 29 mm Edwards-Sapien Valve.  2.  Optimum deployment angle is LAO 10, CAU 10.  3.  Sufficient distance from annulus to coronary arteries.  4. Sufficient distance from annulus to sheath tip for transaortic approach.  Ena Dawley   Electronically Signed   By: Ena Dawley   On: 09/02/2013 19:20   09/02/2013   EXAM: OVER-READ INTERPRETATION  CT CHEST  The following report is an over-read performed by radiologist Dr. Rebekah Chesterfield [facility] on 09/01/2013. This over-read does not include interpretation of cardiac or coronary anatomy or pathology. The interpretation by the cardiologist is attached.  COMPARISON:  CTA of the chest, abdomen and pelvis 08/29/2013. CT of the chest 05/29/2013.  FINDINGS: Please see separate dictation for CTA of the chest, abdomen and pelvis from 08/29/2013 for full description of extracardiac findings.  IMPRESSION: Please see separate dictation for CTA of the chest, abdomen and pelvis from 08/29/2013 for full description of extracardiac findings.  Electronically  Signed: By: Vinnie Langton M.D. On: 09/01/2013 08:37   Ct Angio Chest Aorta W/cm &/or Wo/cm  08/31/2013   CLINICAL DATA:  73 year old male with history of severe aortic stenosis. Preprocedural study prior to potential transcatheter aortic valve replacement (TAVR) procedure.  EXAM: CT ANGIOGRAPHY CHEST, ABDOMEN AND PELVIS  TECHNIQUE: Multidetector CT imaging through the chest, abdomen and pelvis was performed using the standard protocol during bolus administration of intravenous contrast. Multiplanar reconstructed images and MIPs were obtained and reviewed to evaluate the vascular anatomy.  CONTRAST:  70 mL of Omnipaque 350.  COMPARISON:  PET-CT 06/11/2013.  FINDINGS: CT CHEST FINDINGS  Mediastinum: Heart size is borderline enlarged. There is likely concentric left ventricular hypertrophy. There is no significant pericardial fluid, thickening or pericardial calcification. There is atherosclerosis of the thoracic aorta, the great vessels of the mediastinum and the coronary arteries, including calcified atherosclerotic plaque in the left main, left anterior descending, left circumflex and right coronary arteries. Severe calcifications of the aortic valve. Calcified right peritracheal lymph nodes. Prominent soft tissue in the right hilar and suprahilar region, similar to prior studies, without well definable lymphadenopathy. Esophagus is unremarkable in appearance. Left innominate vein stent extending into the superior vena cava.  Lungs/Pleura: Area of masslike consolidation, chronic volume loss, architectural distortion and cavitation in the right apex, similar to prior examinations (recently demonstrated to have peripheral hypermetabolism on PET-CT 06/11/2013). No acute consolidative airspace disease in the remainder of the lungs. Chronic trace left pleural effusion is unchanged. Numerous calcified pleural plaques throughout the thorax bilaterally. No definite new suspicious appearing pulmonary nodules.   Musculoskeletal: There are no aggressive appearing lytic or blastic lesions noted in the visualized portions of the skeleton.  CT ABDOMEN AND PELVIS FINDINGS  Abdomen/Pelvis: The appearance of the liver, gallbladder, pancreas, spleen and right adrenal gland is unremarkable. Calcifications in the left adrenal gland may relate to prior infection or hemorrhage. Unusual calcifications associated with the lower pole of the left kidney and the surrounding retroperitoneal fat overlying  the left psoas muscle are similar to prior examinations, presumably related to prior trauma/hemorrhage. There is extensive scarring throughout the posterior aspect of the left kidney an in the lower pole. The right kidney is severely atrophic.  Trace volume of ascites. No pneumoperitoneum. No pathologic distention of small bowel. Status post left hemicolectomy with right lower quadrant colostomy. No definite lymphadenopathy in the abdomen or pelvis.  Status post aorto bi-iliac bypass graft placement. Vascular findings and measurements pertinent 2 potential TAVR procedure, as described below. In addition, there appears to be significant stenosis at the ostium of the superior mesenteric artery which is narrowed to approximately 3 mm (distal to the area of narrowing common luminal diameter is approximately 9 mm). Single right renal artery appears severely stenotic. Single left renal artery is grossly patent, although the ostium of the vessel is difficult to visualize on today's examination secondary to adjacent graft and severe calcifications. Inferior mesenteric artery appears occluded.  Musculoskeletal: Bilateral pars defects at L5. There are no aggressive appearing lytic or blastic lesions noted in the visualized portions of the skeleton.  VASCULAR MEASUREMENTS PERTINENT TO TAVR:  AORTA:  Minimal Aortic Diameter -  13 x 11 mm  Severity of Aortic Calcification -  Severe  RIGHT PELVIS:  Right Common Iliac Artery -  Minimal Diameter - 4.2 x 3.9  mm  Tortuosity - Mild  Calcification - Severe  Right External Iliac Artery -  Minimal Diameter - 5.3 x 6.4 mm  Tortuosity - Mild  Calcification - Moderate  Right Common Femoral Artery -  Minimal Diameter - 6.5 x 5.4 mm  Tortuosity - Mild  Calcification - Moderate  LEFT PELVIS:  Left Common Iliac Artery -  Minimal Diameter - 5.4 x 5.2 mm  Tortuosity - Mild  Calcification - Severe  Left External Iliac Artery -  Minimal Diameter - 5.5 x 5.4 mm  Tortuosity - Mild  Calcification - Moderate  Left Common Femoral Artery -  Minimal Diameter - 5.7 x 4.6 mm  Tortuosity - Mild  Calcification - Severe  Review of the MIP images confirms the above findings.  IMPRESSION: 1. Vascular findings and measurements pertinent to potential TAVR procedure, as detailed above. This patient does not appear to have suitable pelvic arterial access. 2. Persistent thick-walled cavitary mass like area in the right apex. Given the hypermetabolism on prior PET-CT 06/11/2013, the possibility of residual/recurrent disease in this region is not excluded, however, given the stability of this area over the prior several examinations, the hypermetabolism may simply be related to chronic inflammation from colonization of this cavitary lesion. Continued attention on followup studies is recommended. 3. Bilateral calcified pleural plaques redemonstrated, indicative of asbestos related pleural disease. 4. Concentric left ventricular hypertrophy. 5. Atherosclerosis, including left main and 3 vessel coronary artery disease. 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 6. Status post left hemicolectomy with right lower quadrant ostomy. 7. Additional incidental findings, as  above.   Electronically Signed   By: Vinnie Langton M.D.   On: 08/31/2013 12:57   Ct Angio Abd/pel W/ And/or W/o  08/31/2013   CLINICAL DATA:  73 year old male with history of severe aortic stenosis. Preprocedural study prior to potential transcatheter aortic valve replacement (TAVR) procedure.  EXAM: CT ANGIOGRAPHY CHEST, ABDOMEN  AND PELVIS  TECHNIQUE: Multidetector CT imaging through the chest, abdomen and pelvis was performed using the standard protocol during bolus administration of intravenous contrast. Multiplanar reconstructed images and MIPs were obtained and reviewed to evaluate the vascular anatomy.  CONTRAST:  70 mL of Omnipaque 350.  COMPARISON:  PET-CT 06/11/2013.  FINDINGS: CT CHEST FINDINGS  Mediastinum: Heart size is borderline enlarged. There is likely concentric left ventricular hypertrophy. There is no significant pericardial fluid, thickening or pericardial calcification. There is atherosclerosis of the thoracic aorta, the great vessels of the mediastinum and the coronary arteries, including calcified atherosclerotic plaque in the left main, left anterior descending, left circumflex and right coronary arteries. Severe calcifications of the aortic valve. Calcified right peritracheal lymph nodes. Prominent soft tissue in the right hilar and suprahilar region, similar to prior studies, without well definable lymphadenopathy. Esophagus is unremarkable in appearance. Left innominate vein stent extending into the superior vena cava.  Lungs/Pleura: Area of masslike consolidation, chronic volume loss, architectural distortion and cavitation in the right apex, similar to prior examinations (recently demonstrated to have peripheral hypermetabolism on PET-CT 06/11/2013). No acute consolidative airspace disease in the remainder of the lungs. Chronic trace left pleural effusion is unchanged. Numerous calcified pleural plaques throughout the thorax bilaterally. No definite new suspicious appearing  pulmonary nodules.  Musculoskeletal: There are no aggressive appearing lytic or blastic lesions noted in the visualized portions of the skeleton.  CT ABDOMEN AND PELVIS FINDINGS  Abdomen/Pelvis: The appearance of the liver, gallbladder, pancreas, spleen and right adrenal gland is unremarkable. Calcifications in the left adrenal gland may relate to prior infection or hemorrhage. Unusual calcifications associated with the lower pole of the left kidney and the surrounding retroperitoneal fat overlying the left psoas muscle are similar to prior examinations, presumably related to prior trauma/hemorrhage. There is extensive scarring throughout the posterior aspect of the left kidney an in the lower pole. The right kidney is severely atrophic.  Trace volume of ascites. No pneumoperitoneum. No pathologic distention of small bowel. Status post left hemicolectomy with right lower quadrant colostomy. No definite lymphadenopathy in the abdomen or pelvis.  Status post aorto bi-iliac bypass graft placement. Vascular findings and measurements pertinent 2 potential TAVR procedure, as described below. In addition, there appears to be significant stenosis at the ostium of the superior mesenteric artery which is narrowed to approximately 3 mm (distal to the area of narrowing common luminal diameter is approximately 9 mm). Single right renal artery appears severely stenotic. Single left renal artery is grossly patent, although the ostium of the vessel is difficult to visualize on today's examination secondary to adjacent graft and severe calcifications. Inferior mesenteric artery appears occluded.  Musculoskeletal: Bilateral pars defects at L5. There are no aggressive appearing lytic or blastic lesions noted in the visualized portions of the skeleton.  VASCULAR MEASUREMENTS PERTINENT TO TAVR:  AORTA:  Minimal Aortic Diameter -  13 x 11 mm  Severity of Aortic Calcification -  Severe  RIGHT PELVIS:  Right Common Iliac Artery -  Minimal  Diameter - 4.2 x 3.9 mm  Tortuosity - Mild  Calcification - Severe  Right External Iliac Artery -  Minimal Diameter - 5.3 x 6.4 mm  Tortuosity - Mild  Calcification - Moderate  Right Common Femoral Artery -  Minimal Diameter - 6.5 x 5.4 mm  Tortuosity - Mild  Calcification - Moderate  LEFT PELVIS:  Left Common Iliac Artery -  Minimal Diameter - 5.4 x 5.2 mm  Tortuosity - Mild  Calcification -  Severe  Left External Iliac Artery -  Minimal Diameter - 5.5 x 5.4 mm  Tortuosity - Mild  Calcification - Moderate  Left Common Femoral Artery -  Minimal Diameter - 5.7 x 4.6 mm  Tortuosity - Mild  Calcification - Severe  Review of the MIP images confirms the above findings.  IMPRESSION: 1. Vascular findings and measurements pertinent to potential TAVR procedure, as detailed above. This patient does not appear to have suitable pelvic arterial access. 2. Persistent thick-walled cavitary mass like area in the right apex. Given the hypermetabolism on prior PET-CT 06/11/2013, the possibility of residual/recurrent disease in this region is not excluded, however, given the stability of this area over the prior several examinations, the hypermetabolism may simply be related to chronic inflammation from colonization of this cavitary lesion. Continued attention on followup studies is recommended. 3. Bilateral calcified pleural plaques redemonstrated, indicative of asbestos related pleural disease. 4. Concentric left ventricular hypertrophy. 5. Atherosclerosis, including left main and 3 vessel coronary artery disease. 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 6. Status post left hemicolectomy with right lower quadrant ostomy. 7. Additional  incidental findings, as above.   Electronically Signed   By: Vinnie Langton M.D.   On: 08/31/2013 12:57    Oren Binet, MD  [facility] Pager:[phone]  If 7PM-7AM, please contact night-coverage [email or web address] Password [email or web address] 09/19/2013, 2:29 PM   LOS: 1 day

## 2013-09-19 NOTE — Progress Notes (Signed)
CRITICAL VALUE ALERT  Critical value received:  Troponin  Date of notification:  09/18/13  Time of notification:  2100  Critical value read back:yes  Nurse who received alert:  [place] Livana Yerian, RN  MD notified (1st page):  Rogue Bussing  Time of first page:  2100  MD notified (2nd page):  Time of second page:  Responding MD:  Rogue Bussing  Time MD responded:  2107

## 2013-09-20 DIAGNOSIS — N186 End stage renal disease: Secondary | ICD-10-CM

## 2013-09-20 LAB — CBC
HEMATOCRIT: 30.3 % — AB (ref 39.0–52.0)
HEMOGLOBIN: 9.9 g/dL — AB (ref 13.0–17.0)
MCH: 32 pg (ref 26.0–34.0)
MCHC: 32.7 g/dL (ref 30.0–36.0)
MCV: 98.1 fL (ref 78.0–100.0)
Platelets: 151 10*3/uL (ref 150–400)
RBC: 3.09 MIL/uL — ABNORMAL LOW (ref 4.22–5.81)
RDW: 17.9 % — AB (ref 11.5–15.5)
WBC: 3.4 10*3/uL — ABNORMAL LOW (ref 4.0–10.5)

## 2013-09-20 LAB — BASIC METABOLIC PANEL
BUN: 49 mg/dL — ABNORMAL HIGH (ref 6–23)
CALCIUM: 8.8 mg/dL (ref 8.4–10.5)
CO2: 23 mEq/L (ref 19–32)
CREATININE: 6.23 mg/dL — AB (ref 0.50–1.35)
Chloride: 103 mEq/L (ref 96–112)
GFR, EST AFRICAN AMERICAN: 9 mL/min — AB (ref >90)
GFR, EST NON AFRICAN AMERICAN: 8 mL/min — AB (ref >90)
GLUCOSE: 82 mg/dL (ref 70–99)
Potassium: 5.3 mEq/L (ref 3.7–5.3)
Sodium: 143 mEq/L (ref 137–147)

## 2013-09-20 NOTE — Progress Notes (Signed)
[facility] Progress Note  Subjective: Ostomy output remains brown, hemoglobin stable. No complaints.  Objective: Vital signs in last 24 hours: Temp:  [97.2 F (36.2 C)-98.2 F (36.8 C)] 97.7 F (36.5 C) (03/01 0538) Pulse Rate:  [76-84] 76 (03/01 0538) Resp:  [18] 18 (03/01 0538) BP: (106-119)/(63-68) 108/63 mmHg (03/01 0538) SpO2:  [96 %-98 %] 97 % (03/01 0930) Weight:  [58 kg (127 lb 13.9 oz)] 58 kg (127 lb 13.9 oz) (03/01 0538) Weight change: -0.6 kg (-1 lb 5.2 oz)   PE: Abdomen soft positive bowel sounds Lab Results: Results for orders placed during the hospital encounter of 09/18/13 (from the past 24 hour(s))  TROPONIN I     Status: Abnormal   Collection Time    09/19/13 12:20 PM      Result Value Ref Range   Troponin I 0.34 (*) <0.30 ng/mL  CBC     Status: Abnormal   Collection Time    09/19/13  6:30 PM      Result Value Ref Range   WBC 3.1 (*) 4.0 - 10.5 K/uL   RBC 3.10 (*) 4.22 - 5.81 MIL/uL   Hemoglobin 10.1 (*) 13.0 - 17.0 g/dL   HCT 30.6 (*) 39.0 - 52.0 %   MCV 98.7  78.0 - 100.0 fL   MCH 32.6  26.0 - 34.0 pg   MCHC 33.0  30.0 - 36.0 g/dL   RDW 18.0 (*) 11.5 - 15.5 %   Platelets 150  150 - 400 K/uL  CBC     Status: Abnormal   Collection Time    09/20/13  5:20 AM      Result Value Ref Range   WBC 3.4 (*) 4.0 - 10.5 K/uL   RBC 3.09 (*) 4.22 - 5.81 MIL/uL   Hemoglobin 9.9 (*) 13.0 - 17.0 g/dL   HCT 30.3 (*) 39.0 - 52.0 %   MCV 98.1  78.0 - 100.0 fL   MCH 32.0  26.0 - 34.0 pg   MCHC 32.7  30.0 - 36.0 g/dL   RDW 17.9 (*) 11.5 - 15.5 %   Platelets 151  150 - 400 K/uL  BASIC METABOLIC PANEL     Status: Abnormal   Collection Time    09/20/13  5:20 AM      Result Value Ref Range   Sodium 143  137 - 147 mEq/L   Potassium 5.3  3.7 - 5.3 mEq/L   Chloride 103  96 - 112 mEq/L   CO2 23  19 - 32 mEq/L   Glucose, Bld 82  70 - 99 mg/dL   BUN 49 (*) 6 - 23 mg/dL   Creatinine, Ser 6.23 (*) 0.50 - 1.35 mg/dL   Calcium 8.8  8.4 - 10.5 mg/dL   GFR calc  non Af Amer 8 (*) >90 mL/min   GFR calc Af Amer 9 (*) >90 mL/min    Studies/Results: No results found.    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 We'll sign off for now. Please call if recurrent bleeding    Nayely Dingus C 09/20/2013, 10:15 AM

## 2013-09-20 NOTE — Consult Note (Addendum)
Indication for Consultation:  Management of ESRD/hemodialysis; anemia, hypertension/volume and secondary hyperparathyroidism  HPI: Gerald Cooke is a 73 y.o. male transferred from [facility] Friday night for further eval of melena. Pt receives HD MWF @ [place] (with [place]). He presented to the ED at [facility] Friday for recurrent melena he received 1u PRBC, for Hgb 7.8, and was transferred to [facility]. he has a history of GI bleed and was admitted to [facility] in Feb (discharged 2/22), had an EGD which showed a radiation esophagitis with no other abnormality. He reports he is feeling much better and has not noticed any blood in his stool since Saturday.  Past Medical History  Diagnosis Date  . Colostomy in place   . Abdominal aortic aneurysm 02/2007    Ruptured, hospitalized at [facility] for 52 days, c/b loss of colon and renal function. Followed by Dr Ronalee Belts  . Hyperlipidemia   . Atherosclerosis   . Esophageal reflux   . Hypertension   . PSVT (paroxysmal supraventricular tachycardia)   . Aortic stenosis   . Lung cancer 2009    Initial clinical stage IIIB - treated with radiation + chemotherapy  . Heart murmur   . ESRD (end stage renal disease) on dialysis     "[facility]; [place]; [place], Wed & Fri,  Dr Holley Raring" (08/24/2013)  . Exertional shortness of breath   . Anemia   . History of blood transfusion 2008    "related to ruptured AAA" (08/24/2013)  . Arthritis     "fingers"   . GERD (gastroesophageal reflux disease)   . Chronic diastolic congestive heart failure   . S/P radiation therapy > 12 wks ago   . S/P chemotherapy, time since greater than 12 weeks   . 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 Hypoalbuminemia   . Anemia in chronic kidney disease   . GI (gastrointestinal bleed)   . Heparin allergy   . Non-STEMI (non-ST elevated myocardial infarction)   . Malnutrition    Past Surgical History  Procedure Laterality Date  . Colostomy  03/17/2007    end colostomy of right colon near hepatic  flexure  . Left colectomy  03/17/2007    extended left colectomy and proctectomy for ischemic colitis after emergency repair of ruptured AAA  . Abdominal aortic aneurysm repair  03/14/2007    [facility]:  emergency repair for ruptured AAA  . Arteriovenous graft placement Left 2008    "arm"  . Pilonidal cyst / sinus excision    . Cataract extraction w/ intraocular lens implant Right   . Esophagogastroduodenoscopy Left 08/27/2013    Procedure: ESOPHAGOGASTRODUODENOSCOPY (EGD);  Surgeon: Arta Silence, MD;  Location: [facility] ENDOSCOPY;  Service: Endoscopy;  Laterality: Left;   No family history on file. Social History:  reports that he has quit smoking. His smoking use included Cigarettes. He has a 50 pack-year smoking history. He has never used smokeless tobacco. He reports that he drinks alcohol. He reports that he does not use illicit drugs. Allergies  Allergen Reactions  . 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 Shellfish Allergy Other (See Comments)    Throat swelled/ choking  . Tetanus Toxoids Swelling    Arm swelled up   Prior to Admission medications   Medication Sig Start Date End Date Taking? Authorizing Provider  amLODipine (NORVASC) 2.5 MG tablet Take 1 tablet (2.5  mg total) by mouth daily. 08/29/13  Yes Cecilie Kicks, NP  aspirin EC 81 MG tablet Take 81 mg by mouth daily.   Yes Historical Provider, MD  atorvastatin (LIPITOR) 40 MG tablet Take 40 mg by mouth daily.  06/04/13  Yes Historical Provider, MD  Ipratropium-Albuterol (COMBIVENT RESPIMAT) 20-100 MCG/ACT AERS respimat Inhale 1 puff into the lungs 2 (two) times daily as needed for wheezing or shortness of breath.    Yes Historical Provider, MD  lactose free nutrition (BOOST) LIQD Take 237 mLs by mouth daily as needed (protein supplement).   Yes Historical Provider, MD   metoprolol tartrate (LOPRESSOR) 25 MG tablet Take 25 mg by mouth See admin instructions. Take 1 tablet (25 mg) every morning, may take an additional tablet in the evening if blood pressure is over 160 09/23/12  Yes Wellington Hampshire, MD  ondansetron (ZOFRAN) 8 MG tablet Take 8 mg by mouth 2 (two) times daily as needed for nausea or vomiting.   Yes Historical Provider, MD  pantoprazole (PROTONIX) 40 MG tablet Take 1 tablet (40 mg total) by mouth 2 (two) times daily. 08/29/13  Yes Cecilie Kicks, NP  sevelamer (RENVELA) 800 MG tablet Take 800-2,400 mg by mouth See admin instructions. Take 1-2 tablets ([phone] mg) with snacks, and 3 tablets (2400 mg) with meals   Yes Historical Provider, MD  sucralfate (CARAFATE) 1 GM/10ML suspension Take 1 g by mouth See admin instructions. Take 10 mls (2 grams) twice daily, may take an additional dose at bedtime if needed for acid indigestion   Yes Historical Provider, MD  Tetrahydrozoline HCl (VISINE OP) Place 1 drop into both eyes 2 (two) times daily as needed (blurriness/ dry eyes).   Yes Historical Provider, MD   Current Facility-Administered Medications  Medication Dose Route Frequency Provider Last Rate Last Dose  . guaiFENesin (MUCINEX) 12 hr tablet 600 mg  600 mg Oral BID Berle Mull, MD   600 mg at 09/19/13 2146  . ipratropium-albuterol (DUONEB) 0.5-2.5 (3) MG/3ML nebulizer solution 3 mL  3 mL Nebulization Q8H PRN Berle Mull, MD   3 mL at 09/20/13 0929  . ondansetron (ZOFRAN) tablet 4 mg  4 mg Oral Q6H PRN Berle Mull, MD       Or  . ondansetron (ZOFRAN) injection 4 mg  4 mg Intravenous Q6H PRN Berle Mull, MD   4 mg at 09/20/13 0520  . pantoprazole (PROTONIX) injection 40 mg  40 mg Intravenous Q12H Berle Mull, MD   40 mg at 09/20/13 0956  . piperacillin-tazobactam (ZOSYN) IVPB 2.25 g  2.25 g Intravenous 3 times per day Georgina Peer, RPH   2.25 g at 09/20/13 0516  . sevelamer carbonate (RENVELA) tablet 2,400 mg  2,400 mg Oral TID WC Jonetta Osgood,  MD   2,400 mg at 09/19/13 1821  . sevelamer carbonate (RENVELA) tablet 800-1,600 mg  800-1,600 mg Oral PRN Jonetta Osgood, MD      . sodium chloride 0.9 % injection 3 mL  3 mL Intravenous Q12H Berle Mull, MD   3 mL at 09/20/13 0957  . sucralfate (CARAFATE) 1 GM/10ML suspension 1 g  1 g Oral TID Berle Mull, MD   1 g at 09/19/13 2146  . [START ON 09/21/2013] vancomycin (VANCOCIN) 500 mg in sodium chloride 0.9 % 100 mL IVPB  500 mg Intravenous Q M,W,F-HD Georgina Peer, [facility]       Labs: Basic Metabolic Panel:  Recent Labs Lab 09/18/13 2002 09/19/13 0112 09/20/13 2683  NA 144 141 143  K 4.2 4.9 5.3  CL 102 101 103  CO2 29 30 23   GLUCOSE 135* 80 82  BUN 30* 36* 49*  CREATININE 3.83* 4.25* 6.23*  CALCIUM 8.6 8.5 8.8   Liver Function Tests:  Recent Labs Lab 09/18/13 2002 09/19/13 0112  AST 17 16  ALT 12 12  ALKPHOS 59 61  BILITOT 0.3 0.4  PROT 6.0 6.0  ALBUMIN 2.6* 2.6*   No results found for this basename: LIPASE, AMYLASE,  in the last 168 hours No results found for this basename: AMMONIA,  in the last 168 hours CBC:  Recent Labs Lab 09/18/13 2002 09/19/13 0112 09/19/13 0825 09/19/13 1830 09/20/13 0520  WBC 3.5* 3.5* 2.7* 3.1* 3.4*  NEUTROABS 2.7  --   --   --   --   HGB 10.0* 9.6* 9.4* 10.1* 9.9*  HCT 30.2* 28.9* 28.8* 30.6* 30.3*  MCV 97.4 97.0 98.0 98.7 98.1  PLT 163 148* 144* 150 151   Cardiac Enzymes:  Recent Labs Lab 09/18/13 2002 09/19/13 0112 09/19/13 0825 09/19/13 1220  TROPONINI 0.35* 0.42* 0.31* 0.34*   CBG:  Recent Labs Lab 09/15/13 1021  GLUCAP 85   Iron Studies: No results found for this basename: IRON, TIBC, TRANSFERRIN, FERRITIN,  in the last 72 hours Studies/Results: No results found.  Review of Systems: Gen: Denies any fever, chills, sweats, anorexia, fatigue, weakness, malaise, weight loss, and sleep disorder HEENT: No visual complaints, No history of Retinopathy. Normal external appearance No Epistaxis or Sore throat.  No sinusitis.   CV: Denies chest pain, angina, palpitations, syncope, orthopnea, PND, peripheral edema, and claudication. Resp: Reports cough and wheezing and some chest congestion. Denies dyspnea at rest, dyspnea with exercise,coughing up blood, and pleurisy. GI: Reports melena. Denies vomiting blood, jaundice, and fecal incontinence.   Denies dysphagia or odynophagia. Denies abd pain GU : Denies urinary burning, blood in urine, urinary frequency, urinary hesitancy, nocturnal urination, and urinary incontinence.  No renal calculi. MS: Denies joint pain, limitation of movement, and swelling, stiffness, low back pain, extremity pain. Denies muscle weakness, cramps, atrophy.  No use of non steroidal antiinflammatory drugs. Derm: Denies rash, itching, dry skin, hives, moles, warts, or unhealing ulcers.  Psych: Denies depression, anxiety, memory loss, suicidal ideation, hallucinations, paranoia, and confusion. Heme: Denies bruising, and enlarged lymph nodes. Neuro: No headache.  No diplopia. No dysarthria.  No dysphasia.  No history of CVA.  No Seizures. No paresthesias.  No weakness. Endocrine No DM.  No Thyroid disease.  No Adrenal disease.  Physical Exam: Filed Vitals:   09/19/13 1652 09/19/13 2126 09/20/13 0538 09/20/13 0930  BP:  119/65 108/63   Pulse:  84 76   Temp:  98.2 F (36.8 C) 97.7 F (36.5 C)   TempSrc:  Oral Oral   Resp:  18 18   Height:      Weight:   58 kg (127 lb 13.9 oz)   SpO2: 96% 98% 97% 97%     General: Well developed, well nourished, in no acute distress. Head: Normocephalic, atraumatic, sclera non-icteric, mucus membranes are moist Neck: Supple. JVD not elevated. Lungs: Clear bilaterally to auscultation without wheezes, rales, or rhonchi. Breathing is unlabored. Heart: RRR with S1 S2. 3/6 SEM Abdomen: Soft, non-tender, non-distended with normoactive bowel sounds. No rebound/guarding. No obvious abdominal masses. Ostomy- brown stool, no blood visible M-S:  Strength  and tone appear normal for age. Lower extremities:without edema or ischemic changes, no open wounds  Neuro: Alert and  oriented X 3. Moves all extremities spontaneously. Psych:  Responds to questions appropriately with a normal affect. Dialysis Access: LUA AVG +bruit/thrill  Dialysis Orders: MWF @ [place] with [employer]. 3hrs  62kgs  2K/2Ca  450/800  NO Heparin .L AVG    Hectorol 4 mcg IV/HD Epogen 8200  Units IV/HD     Assessment/Plan: 1.  Melena- managed per primary. GI consulted and signed off, no EGD for now. Cont Carafate and protonix. Watch cbc and for bleeding 2. Suspected PNA- vanc and zosyn per primary 3.  ESRD -  K+5.3   HD pending tomorrow. 4kgs below EDW-  4.  Anemia  - hgb 9.9, s/p 1uRBC, no heparin in HD. Cont ESA- watch CBC. Transfuse PRN         Hypertension/volume 108/63 no volume excess 5.  Metabolic bone disease -  IO+0.3, cont hectorol and renvela with meals (phos 5.3) 6.  Nutrition - alb 3.8 will change to renal diet, pt does not follow fluid restriction- voids regularly  Shelle Iron, NP [facility] [id number] 09/20/2013, 12:33 PM    Pt seen, examined and agree w A/P as above.  Kelly Splinter MD pager [phone]    cell [phone] 09/20/2013, 6:06 PM

## 2013-09-20 NOTE — Progress Notes (Signed)
NPO as ordered.  Awaiting EGD today, possibility.

## 2013-09-20 NOTE — Progress Notes (Signed)
PATIENT DETAILS Name: Gerald Cooke Age: 73 y.o. Sex: male Date of Birth: Sep 30, 1940 Admit Date: 09/18/2013 Admitting Physician Robbie Lis, MD ZOX:[id number] AZBELL, MD  Subjective: [name] stools seen in the ostomy bag today  Assessment/Plan: Principal Problem:   UGIB (upper gastrointestinal bleed) - Admitted with dark stool in ostomy. Required 1 unit of PRBC while at [place] by gastroenterology, plans are for observation and EGD as needed. 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 - Continue PPI -Continue to monitor CBC-Hb stable at 9.9 today  Active Problems: Acute blood loss anemia - Per H&P, one at [place] hemoglobin had dropped to 7.8, patient subsequently received 1 unit of PRBC. - Currently hemoglobin stable at 9.9, we'll continue to monitor CBC daily. We'll transfuse as needed  Elevated troponins - Patient without chest pain or shortness of breath. Recently had a coronary angiography with intravascular ultrasound which showed nonobstructive left main plaque. Hold aspirin given possible GI bleeding - Suspect falsely elevated troponin secondary to end-stage renal disease  Suspected postobstructive pneumonia - On empiric vancomycin and Zosyn-day 3  Severe aortic stenosis - And followed by cardiology and cardiothoracic surgery-deemed to be a candidate for TVAR  End-stage renal disease - Hemodialysis on Mondays, Wednesdays and Fridays, and received his for hemodialysis session on FridayHave notified nephrology of patient's admission.  History of lung cancer - Status post chemotherapy/radiation in the past.  Hypertension - BP currently stable without need for antihypertensive medication  History of nonobstructive coronary artery disease - Had a recent left heart cardiac catheterization and an intravascular ultrasound-which showed nonobstructive disease in the left main. - Resume aspirin  when GI bleeding has stopped. Resume Lopressor when blood pressure stable.  Disposition: Remain inpatient  DVT Prophylaxis:  SCD's  Code Status: Full code   Family Communication None at bedside this am  Procedures:  None  CONSULTS:  GI,Renal  MEDICATIONS: Scheduled Meds: . guaiFENesin  600 mg Oral BID  . pantoprazole (PROTONIX) IV  40 mg Intravenous Q12H  . piperacillin-tazobactam (ZOSYN)  IV  2.25 g Intravenous 3 times per day  . sevelamer carbonate  2,400 mg Oral TID WC  . sodium chloride  3 mL Intravenous Q12H  . sucralfate  1 g Oral TID  . [START ON 09/21/2013] vancomycin  500 mg Intravenous Q M,W,F-HD   Continuous Infusions:  PRN Meds:.ipratropium-albuterol, ondansetron (ZOFRAN) IV, ondansetron, sevelamer carbonate  Antibiotics: Anti-infectives   Start     Dose/Rate Route Frequency Ordered Stop   09/21/13 1200  vancomycin (VANCOCIN) 500 mg in sodium chloride 0.9 % 100 mL IVPB     500 mg 100 mL/hr over 60 Minutes Intravenous Every M-W-F (Hemodialysis) 09/18/13 2056     09/18/13 2300  vancomycin (VANCOCIN) 1,250 mg in sodium chloride 0.9 % 250 mL IVPB     1,250 mg 166.7 mL/hr over 90 Minutes Intravenous  Once 09/18/13 2056 09/19/13 0103   09/18/13 2200  piperacillin-tazobactam (ZOSYN) IVPB 2.25 g     2.25 g 100 mL/hr over 30 Minutes Intravenous 3 times per day 09/18/13 2056         PHYSICAL EXAM: Vital signs in last 24 hours: Filed Vitals:   09/19/13 1517 09/19/13 1652 09/19/13 2126 09/20/13 0538  BP: 106/68  119/65 108/63  Pulse: 77  84 76  Temp: 97.2 F (36.2 C)  98.2 F (36.8 C) 97.7 F (36.5 C)  TempSrc: Oral  Oral Oral  Resp:  18  18 18   Height:      Weight:    58 kg (127 lb 13.9 oz)  SpO2: 98% 96% 98% 97%    Weight change: -0.6 kg (-1 lb 5.2 oz) Filed Weights   09/18/13 1903 09/19/13 0523 09/20/13 0538  Weight: 58.6 kg (129 lb 3 oz) 58.6 kg (129 lb 3 oz) 58 kg (127 lb 13.9 oz)   Body mass index is 20.55 kg/(m^2).   Gen Exam: Awake and  alert with clear speech.   Neck: Supple, No JVD.   Chest: B/L Clear.   CVS: S1 S2 Regular, no murmurs.  Abdomen: soft, BS +, non tender, non distended. Colostomy in place-brown loose stools this am Extremities: no edema, lower extremities warm to touch Neurologic: Non Focal.   Skin: No Rash.   Wounds: N/A.    Intake/Output from previous day:  Intake/Output Summary (Last 24 hours) at 09/20/13 0852 Last data filed at 09/19/13 1933  Gross per 24 hour  Intake    612 ml  Output      0 ml  Net    612 ml     LAB RESULTS: CBC  Recent Labs Lab 09/18/13 2002 09/19/13 0112 09/19/13 0825 09/19/13 1830 09/20/13 0520  WBC 3.5* 3.5* 2.7* 3.1* 3.4*  HGB 10.0* 9.6* 9.4* 10.1* 9.9*  HCT 30.2* 28.9* 28.8* 30.6* 30.3*  PLT 163 148* 144* 150 151  MCV 97.4 97.0 98.0 98.7 98.1  MCH 32.3 32.2 32.0 32.6 32.0  MCHC 33.1 33.2 32.6 33.0 32.7  RDW 17.8* 17.8* 18.0* 18.0* 17.9*  LYMPHSABS 0.3*  --   --   --   --   MONOABS 0.4  --   --   --   --   EOSABS 0.0  --   --   --   --   BASOSABS 0.0  --   --   --   --     Chemistries   Recent Labs Lab 09/18/13 2002 09/19/13 0112 09/20/13 0520  NA 144 141 143  K 4.2 4.9 5.3  CL 102 101 103  CO2 29 30 23   GLUCOSE 135* 80 82  BUN 30* 36* 49*  CREATININE 3.83* 4.25* 6.23*  CALCIUM 8.6 8.5 8.8    CBG:  Recent Labs Lab 09/15/13 1021  GLUCAP 85    GFR Estimated Creatinine Clearance: 8.9 ml/min (by C-G formula based on Cr of 6.23).  Coagulation profile  Recent Labs Lab 09/18/13 2002  INR 0.95    Cardiac Enzymes  Recent Labs Lab 09/19/13 0112 09/19/13 0825 09/19/13 1220  TROPONINI 0.42* 0.31* 0.34*    No components found with this basename: POCBNP,  No results found for this basename: DDIMER,  in the last 72 hours No results found for this basename: HGBA1C,  in the last 72 hours No results found for this basename: CHOL, HDL, LDLCALC, TRIG, CHOLHDL, LDLDIRECT,  in the last 72 hours No results found for this basename:  TSH, T4TOTAL, FREET3, T3FREE, THYROIDAB,  in the last 72 hours No results found for this basename: VITAMINB12, FOLATE, FERRITIN, TIBC, IRON, RETICCTPCT,  in the last 72 hours No results found for this basename: LIPASE, AMYLASE,  in the last 72 hours  Urine Studies No results found for this basename: UACOL, UAPR, USPG, UPH, UTP, UGL, UKET, UBIL, UHGB, UNIT, UROB, ULEU, UEPI, UWBC, URBC, UBAC, CAST, CRYS, UCOM, BILUA,  in the last 72 hours  MICROBIOLOGY: No results found for this or any previous visit (from the past  240 hour(s)).  RADIOLOGY STUDIES/RESULTS: Dg Chest 2 View  08/25/2013   CLINICAL DATA:  Shortness of Breath  EXAM: CHEST  2 VIEW  COMPARISON:  August 21, 2013  FINDINGS: There is persistent volume loss in the right upper lobe with a cavitary lesion which appear stable. There is mild scarring in the bases. There is calcification along both hemidiaphragms as well as along several pleural surfaces consistent with previous asbestos exposure. There is some localized pleural thickening in the left mid hemi thorax, stable. There is no new opacity. There is no edema or pulmonary venous hypertension. No cardiomegaly. There is a stent in the innominate vein regions.  IMPRESSION: No appreciable change from the most recent prior study. Cavitary lesion with a cicatrization in the right upper lobe, stable. Evidence of previous asbestos exposure. No frank edema or consolidation. No overt congestive heart failure.   Electronically Signed   By: Lowella Grip M.D.   On: 08/25/2013 08:05   Ct Angio Chest Pe W/cm &/or Wo Cm  08/25/2013   CLINICAL DATA:  Rule out pulmonary embolism, follow-up lung malignancy  EXAM: CT ANGIOGRAPHY CHEST WITH CONTRAST  TECHNIQUE: Multidetector CT imaging of the chest was performed using the standard protocol during bolus administration of intravenous contrast. Multiplanar CT image reconstructions including MIPs were obtained to evaluate the vascular anatomy.  CONTRAST:  17mL  OMNIPAQUE IOHEXOL 350 MG/ML SOLN  COMPARISON:  DG CHEST 2 VIEW dated 08/25/2013; CT ANGIO CHEST dated 05/29/2013  FINDINGS: Contrast within the pulmonary arterial tree is normal in appearance. There are no filling defects to suggest an acute pulmonary embolism. The caliber of the thoracic aorta is normal. No false lumen is demonstrated. The cardiac chambers are mildly enlarged and stable. There is no pericardial or pleural effusion. Coronary artery calcifications are demonstrated. Calcifications of the aortic and mitral valve are stable. No bulky mediastinal or hilar lymph nodes are demonstrated. The caliber of the thoracic esophagus appears normal.  There is abnormal parenchymal consolidation in the right upper lobe consistent with a thick-walled cavitary lesion. Allowing for differences in positioning this has not significantly changed since the previous study. Density previously demonstrated in the posterior superior aspect of the right lower lobe has improved and increased interstitial density posteriorly in the left lower lobe is less prominent today.  Within the upper abdomen the observed portions of the liver and spleen appear normal. The right kidney is atrophic where visualized. Calcification of the visualized portions of the left adrenal gland is demonstrated. Calcification of the pleura over the hemidiaphragms is stable.  The observed portions of the ribs exhibit no acute abnormalities. The thoracic vertebral bodies are preserved in height with the exception of T5 which exhibits mild anterior wedging which is stable. There is prominent thoracic kyphosis.  Review of the MIP images confirms the above findings.  IMPRESSION: 1. There is no evidence of an acute pulmonary embolism or acute thoracic aortic pathology. 2. The small bilateral pleural effusions seen on the study of November 2014 have resolved. 3. There is stable increased density in the right upper lobe. Patchy interstitial density in the lower lobes  bilaterally has resolved. 4. Extensive pleural calcifications are present bilaterally.   Electronically Signed   By: David  Martinique   On: 08/25/2013 15:22   Nm Pet Image Initial (pi) Skull Base To Thigh  09/15/2013   CLINICAL DATA:  Subsequent treatment strategy for Lung cancer. Chronic cavitary lesion in the right upper lobe.  EXAM: NUCLEAR MEDICINE PET SKULL BASE  TO THIGH  FASTING BLOOD GLUCOSE:  Value: 85 mg/dl  TECHNIQUE: 8.4 mCi F-18 FDG was injected intravenously. Full-ring PET imaging was performed from the skull base to thigh after the radiotracer. CT data was obtained and used for attenuation correction and anatomic localization.  COMPARISON:  NM PET TUM IMG RESTAG (PS) SKULL BASE T - THIGH dated 06/11/2013; CT ANGIO CHEST AORTA W/CM & WO/CM dated 08/29/2013  FINDINGS: NECK  Mixed density right maxillary sinusitis. Cannot exclude a fungal component. There is also probably a component mucus retention cyst. And chronic right ethmoid sinusitis with partial right ethmoidectomy. Chronic right sphenoid sinusitis. No hypermetabolic adenopathy in the neck.  CHEST  Right upper lobe airspace opacity with cavitary process, maximum standard uptake value 3.6, formerly 4.9.  Severe atherosclerosis with cardiomegaly. Low-density blood pool suggests anemia. Stable trace left pleural effusion.  ABDOMEN/PELVIS  No abnormal hypermetabolic activity within the liver, pancreas, adrenal glands, or spleen. No hypermetabolic lymph nodes in the abdomen or pelvis. Focally high activity of the gastric cardia, maximum standard uptake value 10.6, without visible mass in this vicinity on the CT data. Dense calcifications along the hemidiaphragms and pleural surfaces compatible with asbestos related pleural disease. Stable irregular calcification in the posterior perirenal space on the left, without significant associated abnormal hypermetabolic activity.  Stable extensive atherosclerotic vascular disease. Atrophic kidneys. Small amount  of free pelvic fluid, significance uncertain, measuring a fluid density but with maximum standard uptake value of 3.2.  SKELETON  No focal hypermetabolic activity to suggest skeletal metastasis. Bilateral pars defects at L5.  IMPRESSION: 1. Reduced metabolic activity associated with the large right apical cavitary process, favoring inflammatory/infectious etiology over malignancy. 2. Hypermetabolic activity along the gastric cardia. This can be physiologic due to peristalsis, particularly given the lack of prior activity in this vicinity and the lack of discernible mass lesion on the CT data. If clinically warranted, endoscopy could be utilized for further characterization. 3. Severe atherosclerosis. 4. Right-sided sinusitis. Cannot exclude a fungal component of the right maxillary sinusitis. 5. Small amount of pelvic ascites, slightly hypermetabolic, but with fluid density. Appearance abnormal but nonspecific, may be due to inflammatory or neoplastic causes. Amount of fluid increased from 08/29/2013. 6. Chronic bilateral pars defects at L5.   Electronically Signed   By: Sherryl Barters M.D.   On: 09/15/2013 14:26   Ct Cardiac Morph/pulm Vein W/cm&w/o Ca Score  09/02/2013   ADDENDUM REPORT: 09/02/2013 19:20  CLINICAL DATA:  Aorticstenosis  EXAM: Cardiac TAVR CT  TECHNIQUE: The patient was scanned on a Philips 256 scanner. A 120 kV retrospective scan was triggered in the descending thoracic aorta at 111 HU's. Gantry rotation speed was 270 msecs and collimation was .9 mm. 10 mg of iv Metoprolol and no nitro was given. The 3D data set was reconstructed in 5% intervals of the R-R cycle. Systolic and diastolic phases were analyzed on a dedicated work station using MPR, MIP and VRT modes. The patient received 80 cc of contrast.  FINDINGS: Aortic Valve: Moderately thickened and severely calcified with limited but still somewhat preserved leaflet opening.  Aorta: Mild aortic calcifications in the ascending and  descending thoracic aorta and moderate calcifications in the aortic arch predominantly at the origin of the brachiocephalic arteries.  Sinotubular Junction:  29 x 29 mm  Ascending Thoracic Aorta:  32 x 31 mm  Aortic Arch:  27 x 27 mm  Descending Thoracic Aorta:  26 x 26 mm  Sinus of Valsalva Measurements:  Non-coronary:  34 mm  Right -coronary:  35 mm  Left -coronary:  35 mm  Coronary Artery Height above Annulus:  Left Main:  11 mm  Right Coronary:  18 mm  Virtual Basal Annulus Measurements:  Maximum/Minimum Diameter:  25.7 x 29.6 mm  Perimeter:  91 mm  Area:  5.8 cm2  Coronary Arteries: Heavily calcified, the study is insufficient for coronary artery read, no nitro was given.  Optimum Fluoroscopic Angle for Delivery:  LAO 10, CAU 10.  Distance from annulus to sheath tip is 70 mm (>60 mm required for 29 mm valve).  IMPRESSION: 1. Severely calcified aortic valve with measurement suitable for 29 mm Edwards-Sapien Valve.  2.  Optimum deployment angle is LAO 10, CAU 10.  3.  Sufficient distance from annulus to coronary arteries.  4. Sufficient distance from annulus to sheath tip for transaortic approach.  Ena Dawley   Electronically Signed   By: Ena Dawley   On: 09/02/2013 19:20   09/02/2013   EXAM: OVER-READ INTERPRETATION  CT CHEST  The following report is an over-read performed by radiologist Dr. Rebekah [facility] on 09/01/2013. This over-read does not include interpretation of cardiac or coronary anatomy or pathology. The interpretation by the cardiologist is attached.  COMPARISON:  CTA of the chest, abdomen and pelvis 08/29/2013. CT of the chest 05/29/2013.  FINDINGS: Please see separate dictation for CTA of the chest, abdomen and pelvis from 08/29/2013 for full description of extracardiac findings.  IMPRESSION: Please see separate dictation for CTA of the chest, abdomen and pelvis from 08/29/2013 for full description of extracardiac findings.  Electronically Signed: By: Vinnie Langton M.D. On: 09/01/2013 08:37   Ct Angio Chest Aorta W/cm &/or Wo/cm  08/31/2013   CLINICAL DATA:  73 year old male with history of severe aortic stenosis. Preprocedural study prior to potential transcatheter aortic valve replacement (TAVR) procedure.  EXAM: CT ANGIOGRAPHY CHEST, ABDOMEN AND PELVIS  TECHNIQUE: Multidetector CT imaging through the chest, abdomen and pelvis was performed using the standard protocol during bolus administration of intravenous contrast. Multiplanar reconstructed images and MIPs were obtained and reviewed to evaluate the vascular anatomy.  CONTRAST:  70 mL of Omnipaque 350.  COMPARISON:  PET-CT 06/11/2013.  FINDINGS: CT CHEST FINDINGS  Mediastinum: Heart size is borderline enlarged. There is likely concentric left ventricular hypertrophy. There is no significant pericardial fluid, thickening or pericardial calcification. There is atherosclerosis of the thoracic aorta, the great vessels of the mediastinum and the coronary arteries, including calcified atherosclerotic plaque in the left main, left anterior descending, left circumflex and right coronary arteries. Severe calcifications of the aortic valve. Calcified right peritracheal lymph nodes. Prominent soft tissue in the right hilar and suprahilar region, similar to prior studies, without well definable lymphadenopathy. Esophagus is unremarkable in appearance. Left innominate vein stent extending into the superior vena cava.  Lungs/Pleura: Area of masslike consolidation, chronic volume loss, architectural distortion and cavitation in the right apex, similar to prior examinations (recently demonstrated to have peripheral hypermetabolism on PET-CT 06/11/2013). No acute consolidative airspace disease in the remainder of the lungs. Chronic trace left pleural effusion is unchanged. Numerous calcified pleural plaques throughout the thorax bilaterally. No definite new suspicious appearing pulmonary nodules.  Musculoskeletal: There are no  aggressive appearing lytic or blastic lesions noted in the visualized portions of the skeleton.  CT ABDOMEN AND PELVIS FINDINGS  Abdomen/Pelvis: The appearance of the liver, gallbladder, pancreas, spleen and right adrenal gland is unremarkable. Calcifications in the left adrenal gland may relate to prior infection  or hemorrhage. Unusual calcifications associated with the lower pole of the left kidney and the surrounding retroperitoneal fat overlying the left psoas muscle are similar to prior examinations, presumably related to prior trauma/hemorrhage. There is extensive scarring throughout the posterior aspect of the left kidney an in the lower pole. The right kidney is severely atrophic.  Trace volume of ascites. No pneumoperitoneum. No pathologic distention of small bowel. Status post left hemicolectomy with right lower quadrant colostomy. No definite lymphadenopathy in the abdomen or pelvis.  Status post aorto bi-iliac bypass graft placement. Vascular findings and measurements pertinent 2 potential TAVR procedure, as described below. In addition, there appears to be significant stenosis at the ostium of the superior mesenteric artery which is narrowed to approximately 3 mm (distal to the area of narrowing common luminal diameter is approximately 9 mm). Single right renal artery appears severely stenotic. Single left renal artery is grossly patent, although the ostium of the vessel is difficult to visualize on today's examination secondary to adjacent graft and severe calcifications. Inferior mesenteric artery appears occluded.  Musculoskeletal: Bilateral pars defects at L5. There are no aggressive appearing lytic or blastic lesions noted in the visualized portions of the skeleton.  VASCULAR MEASUREMENTS PERTINENT TO TAVR:  AORTA:  Minimal Aortic Diameter -  13 x 11 mm  Severity of Aortic Calcification -  Severe  RIGHT PELVIS:  Right Common Iliac Artery -  Minimal Diameter - 4.2 x 3.9 mm  Tortuosity - Mild   Calcification - Severe  Right External Iliac Artery -  Minimal Diameter - 5.3 x 6.4 mm  Tortuosity - Mild  Calcification - Moderate  Right Common Femoral Artery -  Minimal Diameter - 6.5 x 5.4 mm  Tortuosity - Mild  Calcification - Moderate  LEFT PELVIS:  Left Common Iliac Artery -  Minimal Diameter - 5.4 x 5.2 mm  Tortuosity - Mild  Calcification - Severe  Left External Iliac Artery -  Minimal Diameter - 5.5 x 5.4 mm  Tortuosity - Mild  Calcification - Moderate  Left Common Femoral Artery -  Minimal Diameter - 5.7 x 4.6 mm  Tortuosity - Mild  Calcification - Severe  Review of the MIP images confirms the above findings.  IMPRESSION: 1. Vascular findings and measurements pertinent to potential TAVR procedure, as detailed above. This patient does not appear to have suitable pelvic arterial access. 2. Persistent thick-walled cavitary mass like area in the right apex. Given the hypermetabolism on prior PET-CT 06/11/2013, the possibility of residual/recurrent disease in this region is not excluded, however, given the stability of this area over the prior several examinations, the hypermetabolism may simply be related to chronic inflammation from colonization of this cavitary lesion. Continued attention on followup studies is recommended. 3. Bilateral calcified pleural plaques redemonstrated, indicative of asbestos related pleural disease. 4. Concentric left ventricular hypertrophy. 5. Atherosclerosis, including left main and 3 vessel coronary artery disease. 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 6. Status post left hemicolectomy with right lower quadrant ostomy. 7. Additional incidental findings, as above.   Electronically  Signed   By: Vinnie Langton M.D.   On: 08/31/2013 12:57   Ct Angio Abd/pel W/ And/or W/o  08/31/2013   CLINICAL DATA:  73 year old male with history of severe  aortic stenosis. Preprocedural study prior to potential transcatheter aortic valve replacement (TAVR) procedure.  EXAM: CT ANGIOGRAPHY CHEST, ABDOMEN AND PELVIS  TECHNIQUE: Multidetector CT imaging through the chest, abdomen and pelvis was performed using the standard protocol during bolus administration of intravenous contrast. Multiplanar reconstructed images and MIPs were obtained and reviewed to evaluate the vascular anatomy.  CONTRAST:  70 mL of Omnipaque 350.  COMPARISON:  PET-CT 06/11/2013.  FINDINGS: CT CHEST FINDINGS  Mediastinum: Heart size is borderline enlarged. There is likely concentric left ventricular hypertrophy. There is no significant pericardial fluid, thickening or pericardial calcification. There is atherosclerosis of the thoracic aorta, the great vessels of the mediastinum and the coronary arteries, including calcified atherosclerotic plaque in the left main, left anterior descending, left circumflex and right coronary arteries. Severe calcifications of the aortic valve. Calcified right peritracheal lymph nodes. Prominent soft tissue in the right hilar and suprahilar region, similar to prior studies, without well definable lymphadenopathy. Esophagus is unremarkable in appearance. Left innominate vein stent extending into the superior vena cava.  Lungs/Pleura: Area of masslike consolidation, chronic volume loss, architectural distortion and cavitation in the right apex, similar to prior examinations (recently demonstrated to have peripheral hypermetabolism on PET-CT 06/11/2013). No acute consolidative airspace disease in the remainder of the lungs. Chronic trace left pleural effusion is unchanged. Numerous calcified pleural plaques throughout the thorax bilaterally. No definite new suspicious appearing pulmonary nodules.   Musculoskeletal: There are no aggressive appearing lytic or blastic lesions noted in the visualized portions of the skeleton.  CT ABDOMEN AND PELVIS FINDINGS  Abdomen/Pelvis: The appearance of the liver, gallbladder, pancreas, spleen and right adrenal gland is unremarkable. Calcifications in the left adrenal gland may relate to prior infection or hemorrhage. Unusual calcifications associated with the lower pole of the left kidney and the surrounding retroperitoneal fat overlying the left psoas muscle are similar to prior examinations, presumably related to prior trauma/hemorrhage. There is extensive scarring throughout the posterior aspect of the left kidney an in the lower pole. The right kidney is severely atrophic.  Trace volume of ascites. No pneumoperitoneum. No pathologic distention of small bowel. Status post left hemicolectomy with right lower quadrant colostomy. No definite lymphadenopathy in the abdomen or pelvis.  Status post aorto bi-iliac bypass graft placement. Vascular findings and measurements pertinent 2 potential TAVR procedure, as described below. In addition, there appears to be significant stenosis at the ostium of the superior mesenteric artery which is narrowed to approximately 3 mm (distal to the area of narrowing common luminal diameter is approximately 9 mm). Single right renal artery appears severely stenotic. Single left renal artery is grossly patent, although the ostium of the vessel is difficult to visualize on today's examination secondary to adjacent graft and severe calcifications. Inferior mesenteric artery appears occluded.  Musculoskeletal: Bilateral pars defects at L5. There are no aggressive appearing lytic or blastic lesions noted in the visualized portions of the skeleton.  VASCULAR MEASUREMENTS PERTINENT TO TAVR:  AORTA:  Minimal Aortic Diameter -  13 x 11 mm  Severity of Aortic Calcification -  Severe  RIGHT PELVIS:  Right Common Iliac Artery -  Minimal Diameter - 4.2 x 3.9  mm  Tortuosity - Mild  Calcification - Severe  Right External Iliac Artery -  Minimal Diameter - 5.3 x 6.4 mm  Tortuosity - Mild  Calcification - Moderate  Right Common Femoral Artery -  Minimal Diameter - 6.5 x 5.4 mm  Tortuosity - Mild  Calcification - Moderate  LEFT PELVIS:  Left  Common Iliac Artery -  Minimal Diameter - 5.4 x 5.2 mm  Tortuosity - Mild  Calcification - Severe  Left External Iliac Artery -  Minimal Diameter - 5.5 x 5.4 mm  Tortuosity - Mild  Calcification - Moderate  Left Common Femoral Artery -  Minimal Diameter - 5.7 x 4.6 mm  Tortuosity - Mild  Calcification - Severe  Review of the MIP images confirms the above findings.  IMPRESSION: 1. Vascular findings and measurements pertinent to potential TAVR procedure, as detailed above. This patient does not appear to have suitable pelvic arterial access. 2. Persistent thick-walled cavitary mass like area in the right apex. Given the hypermetabolism on prior PET-CT 06/11/2013, the possibility of residual/recurrent disease in this region is not excluded, however, given the stability of this area over the prior several examinations, the hypermetabolism may simply be related to chronic inflammation from colonization of this cavitary lesion. Continued attention on followup studies is recommended. 3. Bilateral calcified pleural plaques redemonstrated, indicative of asbestos related pleural disease. 4. Concentric left ventricular hypertrophy. 5. Atherosclerosis, including left main and 3 vessel coronary artery disease. 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 6. Status post left hemicolectomy with right lower quadrant ostomy. 7. Additional incidental findings, as  above.   Electronically Signed   By: Vinnie Langton M.D.   On: 08/31/2013 12:57    Oren Binet, MD  [facility] Pager:[phone]  If 7PM-7AM, please contact night-coverage [email or web address] Password [email or web address] 09/20/2013, 8:52 AM   LOS: 2 days

## 2013-09-21 LAB — CBC
HEMATOCRIT: 29.2 % — AB (ref 39.0–52.0)
Hemoglobin: 9.6 g/dL — ABNORMAL LOW (ref 13.0–17.0)
MCH: 32.2 pg (ref 26.0–34.0)
MCHC: 32.9 g/dL (ref 30.0–36.0)
MCV: 98 fL (ref 78.0–100.0)
Platelets: 165 10*3/uL (ref 150–400)
RBC: 2.98 MIL/uL — ABNORMAL LOW (ref 4.22–5.81)
RDW: 17.4 % — AB (ref 11.5–15.5)
WBC: 3.8 10*3/uL — ABNORMAL LOW (ref 4.0–10.5)

## 2013-09-21 MED ORDER — LIDOCAINE HCL (PF) 1 % IJ SOLN: 5.0000 mL | INTRAMUSCULAR | Status: DC | PRN

## 2013-09-21 MED ORDER — DARBEPOETIN ALFA-POLYSORBATE 60 MCG/0.3ML IJ SOLN
60.0000 ug | INTRAMUSCULAR | Status: DC
  Administered 2013-09-21: 60 ug via INTRAVENOUS

## 2013-09-21 MED ORDER — BENZONATATE 100 MG PO CAPS
100.0000 mg | ORAL_CAPSULE | Freq: Two times a day (BID) | ORAL | Status: DC | PRN
  Filled 2013-09-21: qty 1

## 2013-09-21 MED ORDER — SODIUM CHLORIDE 0.9 % IV SOLN: 100.0000 mL | INTRAVENOUS | Status: DC | PRN

## 2013-09-21 MED ORDER — AMOXICILLIN-POT CLAVULANATE 500-125 MG PO TABS: 1.0000 | ORAL_TABLET | Freq: Every evening | ORAL | Status: DC

## 2013-09-21 MED ORDER — DARBEPOETIN ALFA-POLYSORBATE 100 MCG/0.5ML IJ SOLN
100.0000 ug | INTRAMUSCULAR | Status: DC
  Filled 2013-09-21: qty 0.5

## 2013-09-21 MED ORDER — GUAIFENESIN ER 600 MG PO TB12: 600.0000 mg | ORAL_TABLET | Freq: Two times a day (BID) | ORAL | Status: DC

## 2013-09-21 MED ORDER — DOXERCALCIFEROL 4 MCG/2ML IV SOLN: 4.0000 ug | INTRAVENOUS | Status: DC

## 2013-09-21 MED ORDER — NEPRO/CARBSTEADY PO LIQD: 237.0000 mL | ORAL | Status: DC | PRN

## 2013-09-21 MED ORDER — NEPRO/CARBSTEADY PO LIQD: 237.0000 mL | Freq: Two times a day (BID) | ORAL | Status: DC

## 2013-09-21 MED ORDER — BENZONATATE 100 MG PO CAPS: 100.0000 mg | ORAL_CAPSULE | Freq: Two times a day (BID) | ORAL | Status: DC | PRN

## 2013-09-21 MED ORDER — PENTAFLUOROPROP-TETRAFLUOROETH EX AERO: 1.0000 "application " | INHALATION_SPRAY | CUTANEOUS | Status: DC | PRN

## 2013-09-21 MED ORDER — DOXERCALCIFEROL 4 MCG/2ML IV SOLN
INTRAVENOUS | Status: AC
  Filled 2013-09-21: qty 2

## 2013-09-21 MED ORDER — DOXERCALCIFEROL 4 MCG/2ML IV SOLN
4.0000 ug | INTRAVENOUS | Status: DC
  Administered 2013-09-21: 4 ug via INTRAVENOUS
  Filled 2013-09-21: qty 2

## 2013-09-21 MED ORDER — DARBEPOETIN ALFA-POLYSORBATE 60 MCG/0.3ML IJ SOLN
INTRAMUSCULAR | Status: AC
  Filled 2013-09-21: qty 0.3

## 2013-09-21 MED ORDER — ALTEPLASE 2 MG IJ SOLR: 2.0000 mg | Freq: Once | INTRAMUSCULAR | Status: DC | PRN

## 2013-09-21 MED ORDER — LIDOCAINE-PRILOCAINE 2.5-2.5 % EX CREA: 1.0000 "application " | TOPICAL_CREAM | CUTANEOUS | Status: DC | PRN

## 2013-09-21 NOTE — Progress Notes (Signed)
Subjective:   Upset about that it took so long to get binders "what have they accomplished "? Objective Vital signs in last 24 hours: Filed Vitals:   09/20/13 1744 09/20/13 2105 09/21/13 0500 09/21/13 0618  BP:  107/57 137/66   Pulse:  95 80   Temp:  97.6 F (36.4 C) 98.1 F (36.7 C)   TempSrc:  Oral Oral   Resp:  18 18   Height:      Weight:   58.5 kg (128 lb 15.5 oz)   SpO2: 97% 98% 97% 95%   Weight change: 0.5 kg (1 lb 1.6 oz)  Intake/Output Summary (Last 24 hours) at 09/21/13 3267 Last data filed at 09/21/13 0900  Gross per 24 hour  Intake    630 ml  Output      0 ml  Net    630 ml   Dialysis Orders: MWF @ [place] with [employer] [facility].  3hrs 62kgs 2K/2Ca 450/800 NO Heparin .L AVG  Hectorol 4 mcg IV/HD Epogen 8200 Units IV/HD     Assessment/ Plan: Pt is a 73 y.o. yo male with ESRD who was admitted on 09/18/2013 with  melena and anemia   Assessment/Plan: 1. ?GIB- hgb stable- GI saw and signed off- no heparin with HD 2. ESRD - normally MWF, planning for HD today no heparin 3. Anemia- due to CKD but also possibility of GIB- give ESA 4. Secondary hyperparathyroidism- cont hectorol, renvela 5. HTN/volume- is under EDW- volume status seems good  6. ? PNA- on zosyn/vanc per primary  7. Elevated troponin- probably not significant  8. Malnutrition- low albumin- will give nepro  9. Dispo- possible discharge soon- need to change to PO abx - pt says he is going home this evening   Gerald Cooke A    Labs: Basic Metabolic Panel:  Recent Labs Lab 09/18/13 2002 09/19/13 0112 09/20/13 0520  NA 144 141 143  K 4.2 4.9 5.3  CL 102 101 103  CO2 29 30 23   GLUCOSE 135* 80 82  BUN 30* 36* 49*  CREATININE 3.83* 4.25* 6.23*  CALCIUM 8.6 8.5 8.8   Liver Function Tests:  Recent Labs Lab 09/18/13 2002 09/19/13 0112  AST 17 16  ALT 12 12  ALKPHOS 59 61  BILITOT 0.3 0.4  PROT 6.0 6.0  ALBUMIN 2.6* 2.6*   No results found for this basename: LIPASE, AMYLASE,   in the last 168 hours No results found for this basename: AMMONIA,  in the last 168 hours CBC:  Recent Labs Lab 09/18/13 2002 09/19/13 0112 09/19/13 0825 09/19/13 1830 09/20/13 0520 09/21/13 0520  WBC 3.5* 3.5* 2.7* 3.1* 3.4* 3.8*  NEUTROABS 2.7  --   --   --   --   --   HGB 10.0* 9.6* 9.4* 10.1* 9.9* 9.6*  HCT 30.2* 28.9* 28.8* 30.6* 30.3* 29.2*  MCV 97.4 97.0 98.0 98.7 98.1 98.0  PLT 163 148* 144* 150 151 165   Cardiac Enzymes:  Recent Labs Lab 09/18/13 2002 09/19/13 0112 09/19/13 0825 09/19/13 1220  TROPONINI 0.35* 0.42* 0.31* 0.34*   CBG:  Recent Labs Lab 09/15/13 1021  GLUCAP 85    Iron Studies: No results found for this basename: IRON, TIBC, TRANSFERRIN, FERRITIN,  in the last 72 hours Studies/Results: No results found. Medications: Infusions:    Scheduled Medications: . guaiFENesin  600 mg Oral BID  . pantoprazole (PROTONIX) IV  40 mg Intravenous Q12H  . piperacillin-tazobactam (ZOSYN)  IV  2.25 g Intravenous 3 times per day  .  sevelamer carbonate  2,400 mg Oral TID WC  . sodium chloride  3 mL Intravenous Q12H  . sucralfate  1 g Oral TID  . vancomycin  500 mg Intravenous Q M,W,F-HD    have reviewed scheduled and prn medications.  Physical Exam: General: standing at sink brushing teeth, upset that it took so long to get his binders Heart: RRR Lungs: clear Abdomen: soft, non tender, ostomy Extremities: no edema Dialysis Access: left upper arm AVG    09/21/2013,9:28 AM  LOS: 3 days

## 2013-09-21 NOTE — Progress Notes (Signed)
Gerald Cooke to be D/C'd Home per MD order.  Discussed with the patient and all questions fully answered.    Medication List    STOP taking these medications       aspirin EC 81 MG tablet      TAKE these medications       amLODipine 2.5 MG tablet  Commonly known as:  NORVASC  Take 1 tablet (2.5 mg total) by mouth daily.     amoxicillin-clavulanate 500-125 MG per tablet  Commonly known as:  AUGMENTIN  Take 1 tablet (500 mg total) by mouth every evening.     atorvastatin 40 MG tablet  Commonly known as:  LIPITOR  Take 40 mg by mouth daily.     benzonatate 100 MG capsule  Commonly known as:  TESSALON  Take 1 capsule (100 mg total) by mouth 2 (two) times daily as needed for cough.     COMBIVENT RESPIMAT 20-100 MCG/ACT Aers respimat  Generic drug:  Ipratropium-Albuterol  Inhale 1 puff into the lungs 2 (two) times daily as needed for wheezing or shortness of breath.     guaiFENesin 600 MG 12 hr tablet  Commonly known as:  MUCINEX  Take 1 tablet (600 mg total) by mouth 2 (two) times daily.     lactose free nutrition Liqd  Take 237 mLs by mouth daily as needed (protein supplement).     feeding supplement (NEPRO CARB STEADY) Liqd  Take 237 mLs by mouth 2 (two) times daily between meals.     metoprolol tartrate 25 MG tablet  Commonly known as:  LOPRESSOR  Take 25 mg by mouth See admin instructions. Take 1 tablet (25 mg) every morning, may take an additional tablet in the evening if blood pressure is over 160     ondansetron 8 MG tablet  Commonly known as:  ZOFRAN  Take 8 mg by mouth 2 (two) times daily as needed for nausea or vomiting.     pantoprazole 40 MG tablet  Commonly known as:  PROTONIX  Take 1 tablet (40 mg total) by mouth 2 (two) times daily.     sevelamer carbonate 800 MG tablet  Commonly known as:  RENVELA  Take 800-2,400 mg by mouth See admin instructions. Take 1-2 tablets ([id number] mg) with snacks, and 3 tablets (2400 mg) with meals     sucralfate 1  GM/10ML suspension  Commonly known as:  CARAFATE  Take 1 g by mouth See admin instructions. Take 10 mls (2 grams) twice daily, may take an additional dose at bedtime if needed for acid indigestion     VISINE OP  Place 1 drop into both eyes 2 (two) times daily as needed (blurriness/ dry eyes).        VVS. IV catheter discontinued intact. Site without signs and symptoms of complications. Dressing and pressure applied.  An After Visit Summary was printed and given to the patient.  D/c education completed with patient/family including follow up instructions, medication list, d/c activities limitations if indicated, with other d/c instructions as indicated by MD - patient able to verbalize understanding, all questions fully answered.   Patient instructed to return to ED, call 911, or call MD for any changes in condition.   Patient escorted via [place], and D/C home via private auto.  Delman Cheadle 09/21/2013 4:05 PM

## 2013-09-21 NOTE — Care Management Note (Signed)
    Page 1 of 1   09/21/2013     11:30:45 AM   CARE MANAGEMENT NOTE 09/21/2013  Patient:  TLALOC, TADDEI   Account Number:  [id number]  Date Initiated:  09/21/2013  Documentation initiated by:  Tomi Bamberger  Subjective/Objective Assessment:   dx uper gib  admit- livew with step son.     Action/Plan:   Anticipated DC Date:  09/21/2013   Anticipated DC Plan:  [place]  CM consult      Choice offered to / List presented to:             Status of service:  Completed, signed off Medicare Important Message given?   (If response is "NO", the following Medicare IM given date fields will be blank) Date Medicare IM given:   Date Additional Medicare IM given:    Discharge Disposition:  HOME/SELF CARE  Per UR Regulation:  Reviewed for med. necessity/level of care/duration of stay  If discussed at [place] of Stay Meetings, dates discussed:    Comments:

## 2013-09-21 NOTE — Procedures (Signed)
Patient was seen on dialysis and the procedure was supervised.  BFR 450  Via AVG BP is  126/67.   Patient appears to be tolerating treatment well  Gerald Cooke A 09/21/2013

## 2013-09-21 NOTE — Progress Notes (Signed)
ANTIBIOTIC CONSULT NOTE - INITIAL  Pharmacy Consult for vancomycin and zosyn Indication: pneumonia  Allergies  Allergen Reactions  . 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 Shellfish Allergy Other (See Comments)    Throat swelled/ choking  . Tetanus Toxoids Swelling    Arm swelled up   Patient Measurements: Height: 5' 6.14" (168 cm) Weight: 128 lb 15.5 oz (58.5 kg) IBW/kg (Calculated) : 64.13  Vital Signs: Temp: 98.1 F (36.7 C) (03/02 0500) Temp src: Oral (03/02 0500) BP: 137/66 mmHg (03/02 0500) Pulse Rate: 80 (03/02 0500) Intake/Output from previous day: 03/01 0701 - 03/02 0700 In: 270 [P.O.:120; IV Piggyback:150] Out: -  Recent Labs  09/18/13 2002 09/19/13 0112  09/19/13 1830 09/20/13 0520 09/21/13 0520  WBC 3.5* 3.5*  < > 3.1* 3.4* 3.8*  HGB 10.0* 9.6*  < > 10.1* 9.9* 9.6*  PLT 163 148*  < > 150 151 165  CREATININE 3.83* 4.25*  --   --  6.23*  --   < > = values in this interval not displayed. Estimated Creatinine Clearance: 9 ml/min (by C-G formula based on Cr of 6.23). No results found for this basename: VANCOTROUGH, VANCOPEAK, VANCORANDOM, GENTTROUGH, GENTPEAK, GENTRANDOM, TOBRATROUGH, TOBRAPEAK, TOBRARND, AMIKACINPEAK, AMIKACINTROU, AMIKACIN,  in the last 72 hours   Medical History: Past Medical History  Diagnosis Date  . Colostomy in place   . Abdominal aortic aneurysm 02/2007    Ruptured, hospitalized at [facility] for 52 days, c/b loss of colon and renal function. Followed by Dr Ronalee Belts  . Hyperlipidemia   . Atherosclerosis   . Esophageal reflux   . Hypertension   . PSVT (paroxysmal supraventricular tachycardia)   . Aortic stenosis   . Lung cancer 2009    Initial clinical stage IIIB - treated with radiation + chemotherapy  . Heart murmur   . ESRD (end stage renal disease) on dialysis    "[facility]; [place]; [place], Wed & Fri,  Dr Holley Raring" (08/24/2013)  . Exertional shortness of breath   . Anemia   . History of blood transfusion 2008    "related to ruptured AAA" (08/24/2013)  . Arthritis     "fingers"   . GERD (gastroesophageal reflux disease)   . Chronic diastolic congestive heart failure   . S/P radiation therapy > 12 wks ago   . S/P chemotherapy, time since greater than 12 weeks   . 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 Hypoalbuminemia   . Anemia in chronic kidney disease   . GI (gastrointestinal bleed)   . Heparin allergy   . Non-STEMI (non-ST elevated myocardial infarction)   . Malnutrition    Assessment: 73 year old male with history of lung cancer, s/p radiation and chemotherapy, hypertension, severe aortic stenosis planned TAVR, NSTEMI, ESRD on HD MWF, radiation esophagitis. He has a chief complaint of melena, received complete HD session earlier today and CXR upon examination appears that he has possible pneumonia.   Today is day #4 of vancomycin and day #3 of zosyn.  Pt is afebrile, without leukocytosis  Goal of Therapy:  Pre-HD vancomycin level of 15-25  Plan:  - Vancomycin 500mg  qHD session - MWF - Zosyn 2.25g IV q8 hours - Follow clinical course and duration of therapy - Vanc level as needed  Hughes Better, PharmD, BCPS Clinical Pharmacist Pager: [phone] 09/21/2013 8:37 AM

## 2013-09-21 NOTE — Discharge Summary (Signed)
PATIENT DETAILS Name: Gerald Cooke Age: 73 y.o. Sex: male Date of Birth: 08-Jul-1941 MRN: [id number]. Admit Date: 09/18/2013 Admitting Physician: Robbie Lis, MD [id number] AZBELL, MD  Recommendations for Outpatient Follow-up:  1. Please check CBC at next visit  PRIMARY DISCHARGE DIAGNOSIS:  Principal Problem:   UGIB (upper gastrointestinal bleed) Active Problems:   Acute blood loss anemia   Hypertension   Hyperlipidemia   Severe aortic stenosis, plan for TAVR, no need for CABG, by cath/IVUS   ESRD (end stage renal disease) on dialysis, MWF   Lung cancer   Radiation esophagitis   Upper GI bleed   HCAP (healthcare-associated pneumonia)      PAST MEDICAL HISTORY: Past Medical History  Diagnosis Date  . Colostomy in place   . Abdominal aortic aneurysm 02/2007    Ruptured, hospitalized at [facility] for 52 days, c/b loss of colon and renal function. Followed by Dr Ronalee Belts  . Hyperlipidemia   . Atherosclerosis   . Esophageal reflux   . Hypertension   . PSVT (paroxysmal supraventricular tachycardia)   . Aortic stenosis   . Lung cancer 2009    Initial clinical stage IIIB - treated with radiation + chemotherapy  . Heart murmur   . ESRD (end stage renal disease) on dialysis     "[facility]; [place]; [place], Wed & Fri,  Dr Holley Raring" (08/24/2013)  . Exertional shortness of breath   . Anemia   . History of blood transfusion 2008    "related to ruptured AAA" (08/24/2013)  . Arthritis     "fingers"   . GERD (gastroesophageal reflux disease)   . Chronic diastolic congestive heart failure   . S/P radiation therapy > 12 wks ago   . S/P chemotherapy, time since greater than 12 weeks   . 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 Hypoalbuminemia   . Anemia in chronic kidney disease   . GI (gastrointestinal bleed)   . Heparin allergy   . Non-STEMI (non-ST elevated myocardial infarction)   . Malnutrition     DISCHARGE MEDICATIONS:   Medication List    STOP taking these  medications       aspirin EC 81 MG tablet      TAKE these medications       amLODipine 2.5 MG tablet  Commonly known as:  NORVASC  Take 1 tablet (2.5 mg total) by mouth daily.     amoxicillin-clavulanate 500-125 MG per tablet  Commonly known as:  AUGMENTIN  Take 1 tablet (500 mg total) by mouth every evening.     atorvastatin 40 MG tablet  Commonly known as:  LIPITOR  Take 40 mg by mouth daily.     benzonatate 100 MG capsule  Commonly known as:  TESSALON  Take 1 capsule (100 mg total) by mouth 2 (two) times daily as needed for cough.     COMBIVENT RESPIMAT 20-100 MCG/ACT Aers respimat  Generic drug:  Ipratropium-Albuterol  Inhale 1 puff into the lungs 2 (two) times daily as needed for wheezing or shortness of breath.     guaiFENesin 600 MG 12 hr tablet  Commonly known as:  MUCINEX  Take 1 tablet (600 mg total) by mouth 2 (two) times daily.     lactose free nutrition Liqd  Take 237 mLs by mouth daily as needed (protein supplement).     feeding supplement (NEPRO CARB STEADY) Liqd  Take 237 mLs by mouth 2 (two) times daily between meals.     metoprolol tartrate 25 MG  tablet  Commonly known as:  LOPRESSOR  Take 25 mg by mouth See admin instructions. Take 1 tablet (25 mg) every morning, may take an additional tablet in the evening if blood pressure is over 160     ondansetron 8 MG tablet  Commonly known as:  ZOFRAN  Take 8 mg by mouth 2 (two) times daily as needed for nausea or vomiting.     pantoprazole 40 MG tablet  Commonly known as:  PROTONIX  Take 1 tablet (40 mg total) by mouth 2 (two) times daily.     sevelamer carbonate 800 MG tablet  Commonly known as:  RENVELA  Take 800-2,400 mg by mouth See admin instructions. Take 1-2 tablets [phone] mg) with snacks, and 3 tablets (2400 mg) with meals     sucralfate 1 GM/10ML suspension  Commonly known as:  CARAFATE  Take 1 g by mouth See admin instructions. Take 10 mls (2 grams) twice daily, may take an additional dose  at bedtime if needed for acid indigestion     VISINE OP  Place 1 drop into both eyes 2 (two) times daily as needed (blurriness/ dry eyes).        ALLERGIES:   Allergies  Allergen Reactions  . 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 Shellfish Allergy Other (See Comments)    Throat swelled/ choking  . Tetanus Toxoids Swelling    Arm swelled up    BRIEF HPI:  See H&P, Labs, Consult and Test reports for all details in brief, patient is a 73 year old male with history of severe aortic stenosis, end-stage renal disease on hemodialysis, lung cancer and history of radiation esophagitis who presented to [facility] for a melanotic output from his colostomy bag. He was found to be anemic, transfuse 1 unit of PRBC, and transferred to [facility] for further evaluation and treatment.  CONSULTATIONS:   GI and nephrology  PERTINENT RADIOLOGIC STUDIES: Dg Chest 2 View  08/25/2013   CLINICAL DATA:  Shortness of Breath  EXAM: CHEST  2 VIEW  COMPARISON:  August 21, 2013  FINDINGS: There is persistent volume loss in the right upper lobe with a cavitary lesion which appear stable. There is mild scarring in the bases. There is calcification along both hemidiaphragms as well as along several pleural surfaces consistent with previous asbestos exposure. There is some localized pleural thickening in the left mid hemi thorax, stable. There is no new opacity. There is no edema or pulmonary venous hypertension. No cardiomegaly. There is a stent in the innominate vein regions.  IMPRESSION: No appreciable change from the most recent prior study. Cavitary lesion with a cicatrization in the right upper lobe, stable. Evidence of previous asbestos exposure. No frank edema or consolidation. No overt congestive heart failure.   Electronically  Signed   By: Lowella Grip M.D.   On: 08/25/2013 08:05   Ct Angio Chest Pe W/cm &/or Wo Cm  08/25/2013   CLINICAL DATA:  Rule out pulmonary embolism, follow-up lung malignancy  EXAM: CT ANGIOGRAPHY CHEST WITH CONTRAST  TECHNIQUE: Multidetector CT imaging of the chest was performed using the standard protocol during bolus administration of intravenous contrast. Multiplanar CT image reconstructions including MIPs were obtained to evaluate the vascular anatomy.  CONTRAST:  150mL OMNIPAQUE IOHEXOL 350 MG/ML SOLN  COMPARISON:  DG CHEST 2 VIEW dated 08/25/2013; CT  ANGIO CHEST dated 05/29/2013  FINDINGS: Contrast within the pulmonary arterial tree is normal in appearance. There are no filling defects to suggest an acute pulmonary embolism. The caliber of the thoracic aorta is normal. No false lumen is demonstrated. The cardiac chambers are mildly enlarged and stable. There is no pericardial or pleural effusion. Coronary artery calcifications are demonstrated. Calcifications of the aortic and mitral valve are stable. No bulky mediastinal or hilar lymph nodes are demonstrated. The caliber of the thoracic esophagus appears normal.  There is abnormal parenchymal consolidation in the right upper lobe consistent with a thick-walled cavitary lesion. Allowing for differences in positioning this has not significantly changed since the previous study. Density previously demonstrated in the posterior superior aspect of the right lower lobe has improved and increased interstitial density posteriorly in the left lower lobe is less prominent today.  Within the upper abdomen the observed portions of the liver and spleen appear normal. The right kidney is atrophic where visualized. Calcification of the visualized portions of the left adrenal gland is demonstrated. Calcification of the pleura over the hemidiaphragms is stable.  The observed portions of the ribs exhibit no acute abnormalities. The thoracic vertebral bodies are preserved  in height with the exception of T5 which exhibits mild anterior wedging which is stable. There is prominent thoracic kyphosis.  Review of the MIP images confirms the above findings.  IMPRESSION: 1. There is no evidence of an acute pulmonary embolism or acute thoracic aortic pathology. 2. The small bilateral pleural effusions seen on the study of November 2014 have resolved. 3. There is stable increased density in the right upper lobe. Patchy interstitial density in the lower lobes bilaterally has resolved. 4. Extensive pleural calcifications are present bilaterally.   Electronically Signed   By: David  Martinique   On: 08/25/2013 15:22   Nm Pet Image Initial (pi) Skull Base To Thigh  09/15/2013   CLINICAL DATA:  Subsequent treatment strategy for Lung cancer. Chronic cavitary lesion in the right upper lobe.  EXAM: NUCLEAR MEDICINE PET SKULL BASE TO THIGH  FASTING BLOOD GLUCOSE:  Value: 85 mg/dl  TECHNIQUE: 8.4 mCi F-18 FDG was injected intravenously. Full-ring PET imaging was performed from the skull base to thigh after the radiotracer. CT data was obtained and used for attenuation correction and anatomic localization.  COMPARISON:  NM PET TUM IMG RESTAG (PS) SKULL BASE T - THIGH dated 06/11/2013; CT ANGIO CHEST AORTA W/CM & WO/CM dated 08/29/2013  FINDINGS: NECK  Mixed density right maxillary sinusitis. Cannot exclude a fungal component. There is also probably a component mucus retention cyst. And chronic right ethmoid sinusitis with partial right ethmoidectomy. Chronic right sphenoid sinusitis. No hypermetabolic adenopathy in the neck.  CHEST  Right upper lobe airspace opacity with cavitary process, maximum standard uptake value 3.6, formerly 4.9.  Severe atherosclerosis with cardiomegaly. Low-density blood pool suggests anemia. Stable trace left pleural effusion.  ABDOMEN/PELVIS  No abnormal hypermetabolic activity within the liver, pancreas, adrenal glands, or spleen. No hypermetabolic lymph nodes in the abdomen or  pelvis. Focally high activity of the gastric cardia, maximum standard uptake value 10.6, without visible mass in this vicinity on the CT data. Dense calcifications along the hemidiaphragms and pleural surfaces compatible with asbestos related pleural disease. Stable irregular calcification in the posterior perirenal space on the left, without significant associated abnormal hypermetabolic activity.  Stable extensive atherosclerotic vascular disease. Atrophic kidneys. Small amount of free pelvic fluid, significance uncertain, measuring a fluid density but with maximum standard uptake value  of 3.2.  SKELETON  No focal hypermetabolic activity to suggest skeletal metastasis. Bilateral pars defects at L5.  IMPRESSION: 1. Reduced metabolic activity associated with the large right apical cavitary process, favoring inflammatory/infectious etiology over malignancy. 2. Hypermetabolic activity along the gastric cardia. This can be physiologic due to peristalsis, particularly given the lack of prior activity in this vicinity and the lack of discernible mass lesion on the CT data. If clinically warranted, endoscopy could be utilized for further characterization. 3. Severe atherosclerosis. 4. Right-sided sinusitis. Cannot exclude a fungal component of the right maxillary sinusitis. 5. Small amount of pelvic ascites, slightly hypermetabolic, but with fluid density. Appearance abnormal but nonspecific, may be due to inflammatory or neoplastic causes. Amount of fluid increased from 08/29/2013. 6. Chronic bilateral pars defects at L5.   Electronically Signed   By: Sherryl Barters M.D.   On: 09/15/2013 14:26   Ct Cardiac Morph/pulm Vein W/cm&w/o Ca Score  09/02/2013   ADDENDUM REPORT: 09/02/2013 19:20  CLINICAL DATA:  Aorticstenosis  EXAM: Cardiac TAVR CT  TECHNIQUE: The patient was scanned on a Philips 256 scanner. A 120 kV retrospective scan was triggered in the descending thoracic aorta at 111 HU's. Gantry rotation speed was  270 msecs and collimation was .9 mm. 10 mg of iv Metoprolol and no nitro was given. The 3D data set was reconstructed in 5% intervals of the R-R cycle. Systolic and diastolic phases were analyzed on a dedicated work station using MPR, MIP and VRT modes. The patient received 80 cc of contrast.  FINDINGS: Aortic Valve: Moderately thickened and severely calcified with limited but still somewhat preserved leaflet opening.  Aorta: Mild aortic calcifications in the ascending and descending thoracic aorta and moderate calcifications in the aortic arch predominantly at the origin of the brachiocephalic arteries.  Sinotubular Junction:  29 x 29 mm  Ascending Thoracic Aorta:  32 x 31 mm  Aortic Arch:  27 x 27 mm  Descending Thoracic Aorta:  26 x 26 mm  Sinus of Valsalva Measurements:  Non-coronary:  34 mm  Right -coronary:  35 mm  Left -coronary:  35 mm  Coronary Artery Height above Annulus:  Left Main:  11 mm  Right Coronary:  18 mm  Virtual Basal Annulus Measurements:  Maximum/Minimum Diameter:  25.7 x 29.6 mm  Perimeter:  91 mm  Area:  5.8 cm2  Coronary Arteries: Heavily calcified, the study is insufficient for coronary artery read, no nitro was given.  Optimum Fluoroscopic Angle for Delivery:  LAO 10, CAU 10.  Distance from annulus to sheath tip is 70 mm (>60 mm required for 29 mm valve).  IMPRESSION: 1. Severely calcified aortic valve with measurement suitable for 29 mm Edwards-Sapien Valve.  2.  Optimum deployment angle is LAO 10, CAU 10.  3.  Sufficient distance from annulus to coronary arteries.  4. Sufficient distance from annulus to sheath tip for transaortic approach.  Ena Dawley   Electronically Signed   By: Ena Dawley   On: 09/02/2013 19:20   09/02/2013   EXAM: OVER-READ INTERPRETATION  CT CHEST  The following report is an over-read performed by radiologist Dr. Rebekah Chesterfield [facility] on 09/01/2013. This over-read does not include interpretation of cardiac or coronary anatomy or  pathology. The interpretation by the cardiologist is attached.  COMPARISON:  CTA of the chest, abdomen and pelvis 08/29/2013. CT of the chest 05/29/2013.  FINDINGS: Please see separate dictation for CTA of the chest, abdomen and pelvis from 08/29/2013 for full description  of extracardiac findings.  IMPRESSION: Please see separate dictation for CTA of the chest, abdomen and pelvis from 08/29/2013 for full description of extracardiac findings.  Electronically Signed: By: Vinnie Langton M.D. On: 09/01/2013 08:37   Ct Angio Chest Aorta W/cm &/or Wo/cm  08/31/2013   CLINICAL DATA:  73 year old male with history of severe aortic stenosis. Preprocedural study prior to potential transcatheter aortic valve replacement (TAVR) procedure.  EXAM: CT ANGIOGRAPHY CHEST, ABDOMEN AND PELVIS  TECHNIQUE: Multidetector CT imaging through the chest, abdomen and pelvis was performed using the standard protocol during bolus administration of intravenous contrast. Multiplanar reconstructed images and MIPs were obtained and reviewed to evaluate the vascular anatomy.  CONTRAST:  70 mL of Omnipaque 350.  COMPARISON:  PET-CT 06/11/2013.  FINDINGS: CT CHEST FINDINGS  Mediastinum: Heart size is borderline enlarged. There is likely concentric left ventricular hypertrophy. There is no significant pericardial fluid, thickening or pericardial calcification. There is atherosclerosis of the thoracic aorta, the great vessels of the mediastinum and the coronary arteries, including calcified atherosclerotic plaque in the left main, left anterior descending, left circumflex and right coronary arteries. Severe calcifications of the aortic valve. Calcified right peritracheal lymph nodes. Prominent soft tissue in the right hilar and suprahilar region, similar to prior studies, without well definable lymphadenopathy. Esophagus is unremarkable in appearance. Left innominate vein stent extending into the superior vena cava.  Lungs/Pleura: Area of masslike  consolidation, chronic volume loss, architectural distortion and cavitation in the right apex, similar to prior examinations (recently demonstrated to have peripheral hypermetabolism on PET-CT 06/11/2013). No acute consolidative airspace disease in the remainder of the lungs. Chronic trace left pleural effusion is unchanged. Numerous calcified pleural plaques throughout the thorax bilaterally. No definite new suspicious appearing pulmonary nodules.  Musculoskeletal: There are no aggressive appearing lytic or blastic lesions noted in the visualized portions of the skeleton.  CT ABDOMEN AND PELVIS FINDINGS  Abdomen/Pelvis: The appearance of the liver, gallbladder, pancreas, spleen and right adrenal gland is unremarkable. Calcifications in the left adrenal gland may relate to prior infection or hemorrhage. Unusual calcifications associated with the lower pole of the left kidney and the surrounding retroperitoneal fat overlying the left psoas muscle are similar to prior examinations, presumably related to prior trauma/hemorrhage. There is extensive scarring throughout the posterior aspect of the left kidney an in the lower pole. The right kidney is severely atrophic.  Trace volume of ascites. No pneumoperitoneum. No pathologic distention of small bowel. Status post left hemicolectomy with right lower quadrant colostomy. No definite lymphadenopathy in the abdomen or pelvis.  Status post aorto bi-iliac bypass graft placement. Vascular findings and measurements pertinent 2 potential TAVR procedure, as described below. In addition, there appears to be significant stenosis at the ostium of the superior mesenteric artery which is narrowed to approximately 3 mm (distal to the area of narrowing common luminal diameter is approximately 9 mm). Single right renal artery appears severely stenotic. Single left renal artery is grossly patent, although the ostium of the vessel is difficult to visualize on today's examination secondary  to adjacent graft and severe calcifications. Inferior mesenteric artery appears occluded.  Musculoskeletal: Bilateral pars defects at L5. There are no aggressive appearing lytic or blastic lesions noted in the visualized portions of the skeleton.  VASCULAR MEASUREMENTS PERTINENT TO TAVR:  AORTA:  Minimal Aortic Diameter -  13 x 11 mm  Severity of Aortic Calcification -  Severe  RIGHT PELVIS:  Right Common Iliac Artery -  Minimal Diameter - 4.2 x 3.9  mm  Tortuosity - Mild  Calcification - Severe  Right External Iliac Artery -  Minimal Diameter - 5.3 x 6.4 mm  Tortuosity - Mild  Calcification - Moderate  Right Common Femoral Artery -  Minimal Diameter - 6.5 x 5.4 mm  Tortuosity - Mild  Calcification - Moderate  LEFT PELVIS:  Left Common Iliac Artery -  Minimal Diameter - 5.4 x 5.2 mm  Tortuosity - Mild  Calcification - Severe  Left External Iliac Artery -  Minimal Diameter - 5.5 x 5.4 mm  Tortuosity - Mild  Calcification - Moderate  Left Common Femoral Artery -  Minimal Diameter - 5.7 x 4.6 mm  Tortuosity - Mild  Calcification - Severe  Review of the MIP images confirms the above findings.  IMPRESSION: 1. Vascular findings and measurements pertinent to potential TAVR procedure, as detailed above. This patient does not appear to have suitable pelvic arterial access. 2. Persistent thick-walled cavitary mass like area in the right apex. Given the hypermetabolism on prior PET-CT 06/11/2013, the possibility of residual/recurrent disease in this region is not excluded, however, given the stability of this area over the prior several examinations, the hypermetabolism may simply be related to chronic inflammation from colonization of this cavitary lesion. Continued attention on followup studies is recommended. 3. Bilateral calcified pleural plaques redemonstrated, indicative of asbestos related pleural disease. 4. Concentric left ventricular hypertrophy. 5. Atherosclerosis, including left main and 3 vessel coronary artery  disease. 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 6. Status post left hemicolectomy with right lower quadrant ostomy. 7. Additional incidental findings, as above.   Electronically Signed   By: Vinnie Langton M.D.   On: 08/31/2013 12:57   Ct Angio Abd/pel W/ And/or W/o  08/31/2013   CLINICAL DATA:  73 year old male with history of severe aortic stenosis. Preprocedural study prior to potential transcatheter aortic valve replacement (TAVR) procedure.  EXAM: CT ANGIOGRAPHY CHEST, ABDOMEN AND PELVIS  TECHNIQUE: Multidetector CT imaging through the chest, abdomen and pelvis was performed using the standard protocol during bolus administration of intravenous contrast. Multiplanar reconstructed images and MIPs were obtained and reviewed to evaluate the vascular anatomy.  CONTRAST:  70 mL of Omnipaque 350.  COMPARISON:  PET-CT 06/11/2013.  FINDINGS: CT CHEST FINDINGS  Mediastinum: Heart size is borderline enlarged. There is likely concentric left ventricular hypertrophy. There is no significant pericardial fluid, thickening or pericardial calcification. There is atherosclerosis of the thoracic aorta, the great vessels of the mediastinum and the coronary arteries, including calcified atherosclerotic plaque in the left main, left anterior descending, left circumflex and right coronary arteries. Severe calcifications of the aortic valve. Calcified right peritracheal lymph nodes. Prominent soft tissue in the right hilar and suprahilar region, similar to prior studies, without well definable lymphadenopathy. Esophagus is unremarkable in appearance. Left innominate vein stent extending into the superior vena cava.  Lungs/Pleura: Area  of masslike consolidation, chronic volume loss, architectural distortion and cavitation in the right apex, similar to prior examinations (recently demonstrated to have peripheral hypermetabolism on PET-CT 06/11/2013). No acute consolidative airspace disease in the remainder of the lungs. Chronic trace left pleural effusion is unchanged. Numerous calcified pleural plaques throughout the thorax bilaterally. No definite new suspicious appearing pulmonary nodules.  Musculoskeletal: There are no aggressive  appearing lytic or blastic lesions noted in the visualized portions of the skeleton.  CT ABDOMEN AND PELVIS FINDINGS  Abdomen/Pelvis: The appearance of the liver, gallbladder, pancreas, spleen and right adrenal gland is unremarkable. Calcifications in the left adrenal gland may relate to prior infection or hemorrhage. Unusual calcifications associated with the lower pole of the left kidney and the surrounding retroperitoneal fat overlying the left psoas muscle are similar to prior examinations, presumably related to prior trauma/hemorrhage. There is extensive scarring throughout the posterior aspect of the left kidney an in the lower pole. The right kidney is severely atrophic.  Trace volume of ascites. No pneumoperitoneum. No pathologic distention of small bowel. Status post left hemicolectomy with right lower quadrant colostomy. No definite lymphadenopathy in the abdomen or pelvis.  Status post aorto bi-iliac bypass graft placement. Vascular findings and measurements pertinent 2 potential TAVR procedure, as described below. In addition, there appears to be significant stenosis at the ostium of the superior mesenteric artery which is narrowed to approximately 3 mm (distal to the area of narrowing common luminal diameter is approximately 9 mm). Single right renal artery appears severely stenotic. Single left renal artery is grossly patent, although the ostium of the vessel is difficult to visualize on today's  examination secondary to adjacent graft and severe calcifications. Inferior mesenteric artery appears occluded.  Musculoskeletal: Bilateral pars defects at L5. There are no aggressive appearing lytic or blastic lesions noted in the visualized portions of the skeleton.  VASCULAR MEASUREMENTS PERTINENT TO TAVR:  AORTA:  Minimal Aortic Diameter -  13 x 11 mm  Severity of Aortic Calcification -  Severe  RIGHT PELVIS:  Right Common Iliac Artery -  Minimal Diameter - 4.2 x 3.9 mm  Tortuosity - Mild  Calcification - Severe  Right External Iliac Artery -  Minimal Diameter - 5.3 x 6.4 mm  Tortuosity - Mild  Calcification - Moderate  Right Common Femoral Artery -  Minimal Diameter - 6.5 x 5.4 mm  Tortuosity - Mild  Calcification - Moderate  LEFT PELVIS:  Left Common Iliac Artery -  Minimal Diameter - 5.4 x 5.2 mm  Tortuosity - Mild  Calcification - Severe  Left External Iliac Artery -  Minimal Diameter - 5.5 x 5.4 mm  Tortuosity - Mild  Calcification - Moderate  Left Common Femoral Artery -  Minimal Diameter - 5.7 x 4.6 mm  Tortuosity - Mild  Calcification - Severe  Review of the MIP images confirms the above findings.  IMPRESSION: 1. Vascular findings and measurements pertinent to potential TAVR procedure, as detailed above. This patient does not appear to have suitable pelvic arterial access. 2. Persistent thick-walled cavitary mass like area in the right apex. Given the hypermetabolism on prior PET-CT 06/11/2013, the possibility of residual/recurrent disease in this region is not excluded, however, given the stability of this area over the prior several examinations, the hypermetabolism may simply be related to chronic inflammation from colonization of this cavitary lesion. Continued attention on followup studies is recommended. 3. Bilateral calcified pleural plaques redemonstrated, indicative of asbestos related pleural disease. 4. Concentric left ventricular hypertrophy. 5. Atherosclerosis, including left main and 3  vessel coronary artery disease. 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 6. Status post left hemicolectomy with right  lower quadrant ostomy. 7. Additional incidental findings, as above.   Electronically Signed   By: Vinnie Langton M.D.   On: 08/31/2013 12:57     PERTINENT LAB RESULTS: CBC:  Recent Labs  09/20/13 0520 09/21/13 0520  WBC 3.4* 3.8*  HGB 9.9* 9.6*  HCT 30.3* 29.2*  PLT 151 165   CMET CMP     Component Value Date/Time   NA 143 09/20/2013 0520   K 5.3 09/20/2013 0520   CL 103 09/20/2013 0520   CO2 23 09/20/2013 0520   GLUCOSE 82 09/20/2013 0520   BUN 49* 09/20/2013 0520   CREATININE 6.23* 09/20/2013 0520   CALCIUM 8.8 09/20/2013 0520   PROT 6.0 09/19/2013 0112   ALBUMIN 2.6* 09/19/2013 0112   AST 16 09/19/2013 0112   ALT 12 09/19/2013 0112   ALKPHOS 61 09/19/2013 0112   BILITOT 0.4 09/19/2013 0112   GFRNONAA 8* 09/20/2013 0520   GFRAA 9* 09/20/2013 0520    GFR Estimated Creatinine Clearance: 9 ml/min (by C-G formula based on Cr of 6.23). No results found for this basename: LIPASE, AMYLASE,  in the last 72 hours  Recent Labs  09/19/13 0112 09/19/13 0825 09/19/13 1220  TROPONINI 0.42* 0.31* 0.34*   No components found with this basename: POCBNP,  No results found for this basename: DDIMER,  in the last 72 hours No results found for this basename: HGBA1C,  in the last 72 hours No results found for this basename: CHOL, HDL, LDLCALC, TRIG, CHOLHDL, LDLDIRECT,  in the last 72 hours No results found for this basename: TSH, T4TOTAL, FREET3, T3FREE, THYROIDAB,  in the last 72 hours No results found for this basename: VITAMINB12, FOLATE, FERRITIN, TIBC, IRON, RETICCTPCT,  in the last 72  hours Coags:  Recent Labs  09/18/13 2002  INR 0.95   Microbiology: Recent Results (from the past 240 hour(s))  CULTURE, BLOOD (ROUTINE X 2)     Status: None   Collection Time    09/18/13  9:26 PM      Result Value Ref Range Status   Specimen Description BLOOD RIGHT ARM   Final   Special Requests BOTTLES DRAWN AEROBIC ONLY 10CC   Final   Culture  Setup Time     Final   Value: 09/19/2013 00:54     Performed at [facility]   Culture     Final   Value:        BLOOD CULTURE RECEIVED NO GROWTH TO DATE CULTURE WILL BE HELD FOR 5 DAYS BEFORE ISSUING A FINAL NEGATIVE REPORT     Performed at [facility]   Report Status PENDING   Incomplete  CULTURE, BLOOD (ROUTINE X 2)     Status: None   Collection Time    09/18/13  9:35 PM      Result Value Ref Range Status   Specimen Description BLOOD RIGHT HAND   Final   Special Requests BOTTLES DRAWN AEROBIC ONLY [place]   Final   Culture  Setup Time     Final   Value: 09/19/2013 00:54     Performed at [facility]   Culture     Final   Value:        BLOOD CULTURE RECEIVED NO GROWTH TO DATE CULTURE WILL BE HELD FOR 5 DAYS BEFORE ISSUING A FINAL NEGATIVE REPORT     Performed at [facility]   Report Status PENDING   Incomplete     [place]:   Principal  Problem: UGIB (upper gastrointestinal bleed)  - Admitted with dark stool in ostomy. Required 1 unit of PRBC while at [place] by gastroenterology, plans were for for observation and EGD as needed. Since, hemoglobin remained stable post transfusion, and patient started having brown stool in his colostomy bag, GI deferred procedure during this admission. 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  - Continue PPI on discharge.  Active Problems: Acute blood loss anemia  - Per H&P, one at [facility] hemoglobin had dropped to 7.8, patient subsequently received 1 unit of PRBC. - Hemoglobin stable  post transfusion, hemoglobin on day of discharge 9.6.  Suspected postobstructive pneumonia  - On empiric vancomycin and Zosyn-day 4, afebrile leukocytosis transitioned to Augmentin on discharge.  Severe aortic stenosis  - And followed by cardiology and cardiothoracic surgery-deemed to be a candidate for TVAR - Has a outpatient appointment with heart valve clinic with Dr.Cooper this coming Friday.  End-stage renal disease  - Hemodialysis on Mondays, Wednesdays and Fridays, and received his for hemodialysis session on Friday.Have notified nephrology of patient's admission. Patient will have dialysis today, and subsequently discharged home after that.  Elevated troponins  - Patient without chest pain or shortness of breath. Recently had a coronary angiography with intravascular ultrasound which showed nonobstructive left main plaque. Hold aspirin given possible GI bleeding  - Suspect falsely elevated troponin secondary to end-stage renal disease and severe aortic stenosis. Case was discussed briefly with cardiology by the admitting M.D., and also with Dr. Burt Knack by this M.D.  History of lung cancer  - Status post chemotherapy/radiation in the past.   Hypertension  - BP currently stable without need for antihypertensive medication   History of nonobstructive coronary artery disease  - Had a recent left heart cardiac catheterization and an intravascular ultrasound-which showed nonobstructive disease in the left main.  - Resume aspirin when GI bleeding has stopped. Resume Lopressor when blood pressure stable.   TODAY-DAY OF DISCHARGE:  Subjective:   Gerald Cooke today has no headache,no chest abdominal pain,no new weakness tingling or numbness, feels much better wants to go home today. Brown stools seen in the colostomy bag today  Objective:   Blood pressure 137/66, pulse 80, temperature 98.1 F (36.7 C), temperature source Oral, resp. rate 18, height 5' 6.14" (1.68 m), weight 58.5 kg  (128 lb 15.5 oz), SpO2 95.00%.  Intake/Output Summary (Last 24 hours) at 09/21/13 0951 Last data filed at 09/21/13 0900  Gross per 24 hour  Intake    630 ml  Output      0 ml  Net    630 ml   Filed Weights   09/19/13 0523 09/20/13 0538 09/21/13 0500  Weight: 58.6 kg (129 lb 3 oz) 58 kg (127 lb 13.9 oz) 58.5 kg (128 lb 15.5 oz)    Exam Awake Alert, Oriented *3, No new F.N deficits, Normal affect [place].AT,PERRAL Supple Neck,No JVD, No cervical lymphadenopathy appriciated.  Symmetrical Chest wall movement, Good air movement bilaterally, CTAB RRR,No Gallops,Rubs or new Murmurs, No Parasternal Heave +ve B.Sounds, Abd Soft, Non tender, No organomegaly appriciated, No rebound -guarding or rigidity. No Cyanosis, Clubbing or edema, No new Rash or bruise  DISCHARGE CONDITION: Stable  DISPOSITION: Home  DISCHARGE INSTRUCTIONS:    Activity:  As tolerated with Full fall precautions use walker/cane & assistance as needed  Diet recommendation: Renal diet Heart Healthy diet  Discharge Orders   Future Appointments Provider Department Dept Phone   09/25/2013 3:30  PM Rexene Alberts, MD [place] [phone]   09/25/2013 3:30 PM Sherren Mocha, MD [place] [phone]   Future Orders Complete By Expires   Call MD for:  As directed    Scheduling Instructions:     If colostomy output is melanotic   Diet - low sodium heart healthy  As directed    Scheduling Instructions:     Renal Diet   Increase activity slowly  As directed       Follow-up Information   Follow up with Rica Mast, MD. Schedule an appointment as soon as possible for a visit in 1 week.   Specialty:  Internal Medicine   Contact information:   [address] [place] [address] [phone]       Follow up with [facility] On 09/25/2013. (appt at 3:30 pm)     Specialty:  Cardiology   Contact information:   [address] [id number] [place] [address] [phone]        Total Time spent on discharge equals 45 minutes.  SignedOren Binet 09/21/2013 9:51 AM

## 2013-09-22 ENCOUNTER — Telehealth: Payer: Self-pay | Admitting: Internal Medicine

## 2013-09-22 NOTE — Telephone Encounter (Signed)
FYI

## 2013-09-22 NOTE — Telephone Encounter (Signed)
Patient Information:  Caller Name: Owyn  Phone: [phone]  Patient: Gerald Cooke, Gerald Cooke  Gender: Male  DOB: 14-Feb-1941  Age: 73 Years  PCP: Ronette Deter (Adults only)  Office Follow Up:  Does the office need to follow up with this patient?: No  Instructions For The Office: N/A   Symptoms  Reason For Call & Symptoms: Patient reports he has been hospitalized within 3 weeks for GI Bleeding.  ON 09/22/13  His colostomy bag is solid black drainage x 3.  Other emergent symptoms ruled out.  Go to ED Now per Nursing judgment and No Protocol Available - Sick Adult guideline. Caller states understanding and intent to follow instructions.  Reviewed Health History In EMR: Yes  Reviewed Medications In EMR: Yes  Reviewed Allergies In EMR: Yes  Reviewed Surgeries / Procedures: Yes  Date of Onset of Symptoms: 09/22/2013  Guideline(s) Used:  No Protocol Available - Sick Adult  Disposition Per Guideline:   Go to ED Now  Reason For Disposition Reached:   Nursing judgment  Advice Given:  Call Back If:  New symptoms develop  You become worse.  Patient Will Follow Care Advice:  YES

## 2013-09-23 NOTE — Telephone Encounter (Signed)
The patient needs a follow up visit with Dr. Gilford Rile within a week . I don't have any appointments for a hospital follow up.

## 2013-09-24 NOTE — Telephone Encounter (Signed)
Any 75min fine. Maybe Tuesday the 10th

## 2013-09-25 ENCOUNTER — Ambulatory Visit: Payer: Medicare Other | Attending: Thoracic Surgery (Cardiothoracic Vascular Surgery) | Admitting: Physical Therapy

## 2013-09-25 ENCOUNTER — Encounter (HOSPITAL_COMMUNITY): Payer: Self-pay | Admitting: Cardiovascular Disease

## 2013-09-25 ENCOUNTER — Ambulatory Visit (HOSPITAL_COMMUNITY)
Admission: RE | Admit: 2013-09-25 | Discharge: 2013-09-25 | Disposition: A | Discharge status: Home / Self Care | Payer: Medicare Other | Source: Ambulatory Visit | Attending: Thoracic Surgery (Cardiothoracic Vascular Surgery) | Admitting: Thoracic Surgery (Cardiothoracic Vascular Surgery)

## 2013-09-25 ENCOUNTER — Encounter (HOSPITAL_COMMUNITY): Payer: Medicare Other | Admitting: Thoracic Surgery (Cardiothoracic Vascular Surgery)

## 2013-09-25 ENCOUNTER — Ambulatory Visit (HOSPITAL_COMMUNITY)
Admission: RE | Admit: 2013-09-25 | Discharge: 2013-09-25 | Disposition: A | Discharge status: Home / Self Care | Payer: Medicare Other | Source: Ambulatory Visit | Attending: Cardiovascular Disease | Admitting: Cardiovascular Disease

## 2013-09-25 ENCOUNTER — Encounter (HOSPITAL_COMMUNITY): Payer: Self-pay | Admitting: Thoracic Surgery (Cardiothoracic Vascular Surgery)

## 2013-09-25 ENCOUNTER — Encounter (HOSPITAL_COMMUNITY): Payer: Medicare Other | Admitting: Cardiovascular Disease

## 2013-09-25 VITALS — BP 110/66 | HR 102 | Resp 19 | Ht 66.0 in | Wt 135.0 lb

## 2013-09-25 DIAGNOSIS — IMO0001 Reserved for inherently not codable concepts without codable children: Secondary | ICD-10-CM | POA: Insufficient documentation

## 2013-09-25 DIAGNOSIS — I350 Nonrheumatic aortic (valve) stenosis: Secondary | ICD-10-CM

## 2013-09-25 DIAGNOSIS — M6281 Muscle weakness (generalized): Secondary | ICD-10-CM | POA: Insufficient documentation

## 2013-09-25 DIAGNOSIS — I359 Nonrheumatic aortic valve disorder, unspecified: Secondary | ICD-10-CM

## 2013-09-25 LAB — CULTURE, BLOOD (ROUTINE X 2)
Culture: NO GROWTH
Culture: NO GROWTH

## 2013-09-25 NOTE — Progress Notes (Signed)
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  The patient has been retired for more than 10 years having previously worked in Chemical engineer. 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 He was hospitalized at [facility] and had a prolonged convalescence. He developed acute renal failure and has remained dialysis dependent ever sinse. 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 He gradually recovered. During his time in the hospital he was noted to have a lung mass in the right upper lobe. Ultimately he was diagnosed with non-small cell lung cancer, clinical stage IIIB. He was treated with full dose radiation therapy and chemotherapy in 2009. He has been followed by Dr. Oliva Bustard and has been felt to remain in clinical remission. In November 2014 followup chest CT scan was interpreted as demonstrating stable radiographic appearance of chronic cavitary mass in the right upper lobe consistent with the patient's previous primary lung cancer. However, there was also question possible lymphangitic spread of tumor. Subsequent PET/CT scan revealed increased uptake within the right upper lobe mass consistent with persistent or recurrent lung cancer. However, there were no other areas of increased uptake at that  time to suggest distant metastatic disease. The patient was followed clinically.  The patient states that for the past several months he has developed gradual progression of symptoms of exertional shortness of breath. He ultimately developed episodes resting shortness of breath, and he was hospitalized briefly at [facility] in January with acute exacerbation of congestive heart failure and possible pneumonia. Symptoms resolved and the patient was discharged home.  On 08/21/2013 the patient presented again to [facility] with burning epigastric chest pain that was prolonged. Patient states he has had several intermittent episodes of less severe prior to admission, but on the day of admission the symptoms persisted. He was given a GI cocktail in the emergency department and his pain resolved. He denied any associated symptoms of shortness of breath. Similar burning epigastric chest pain has not recurred over the last few days. He was noted to have melanotic stool and iron deficient anemia. His hemoglobin gradually decreased to 7.4 at which point he was transfused 1 unit of packed red blood cells. During this period of time the patient was noted to have mildly positive cardiac enzymes and non-specific ECG changes suggestive of possible subendocardial myocardial infarction. Transthoracic echocardiogram was performed demonstrating severe aortic stenosis with peak velocity across the valve measured 4.1 m/s corresponding to peak and mean transvalvular gradients of 69 and 45 mm mercury respectively. There was moderate global left ventricular systolic dysfunction with ejection fraction estimated 40-45%. The patient underwent diagnostic cardiac catheterization demonstrating diffuse nonobstructive coronary artery disease with exception of possible significant ostial stenosis of the left main coronary artery. Pulmonary artery pressures were normal. The patient was transferred to [facility] for further management.  I had  the opportunity to evaluate him in  consultation at that time, and we referred him for GI medicine consultation. The patient was seen in consult and underwent EGD by Dr. 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 It was recommended that the patient he treated with Protonix twice daily.  During that hospitalization Dr. Burt Knack performed repeat cardiac catheterization with IVUS which confirmed the presence of a heavily calcified left main coronary ostium without significant flow limiting plaque.  Because of a reported "allergy" to heparin related to thrombocytopenia following his emergency repair of ruptured AAA in 2008, an HIT assay was drawn and sent during that hospitalization and notably negative on final result.  Prior to hospital discharge he underwent cardiac gated CT angiogram of the heart and CT angiogram of the chest abdomen pelvis as part of a routine pre-TAVR work-up.  Since hospital discharge on 08/28/2013 the patient has been readmitted to the hospital on 2 additional occasions with melena in his colostomy bag, most recently on 09/11/2013 with another acute exacerbation of iron deficient anemia requiring blood transfusion. During this most recent hospitalization the patient was also treated with broad-spectrum antibiotics for presumed pneumonia, and he has just now completed a course of oral Augmentin since hospital discharge.  The patient reports that over the past 10 days he has remained essentially stable. He denies any further episodes of melena in his colostomy bag. He reports stable symptoms of exertional shortness of breath without any episodes of resting shortness of breath, PND, orthopnea, or lower extremity edema. He has not had any particular difficulties with dialysis.  His symptoms of exertional shortness of breath limit his daily physical activity to a significant  degree, but over the past two weeks he has remained comfortable at rest.  He has not had any fevers or chills. He has had a cough that has been for the most part nonproductive. His appetite is fair.     Current Outpatient Prescriptions  Medication Sig Dispense Refill  . amLODipine (NORVASC) 2.5 MG tablet Take 1 tablet (2.5 mg total) by mouth daily.  30 tablet  6  . amoxicillin-clavulanate (AUGMENTIN) 500-125 MG per tablet Take 1 tablet (500 mg total) by mouth every evening.  4 tablet  0  . atorvastatin (LIPITOR) 40 MG tablet Take 40 mg by mouth daily.       . benzonatate (TESSALON) 100 MG capsule Take 1 capsule (100 mg total) by mouth 2 (two) times daily as needed for cough.  20 capsule  0  . CHERATUSSIN AC 100-10 MG/5ML syrup       . guaiFENesin (MUCINEX) 600 MG 12 hr tablet Take 1 tablet (600 mg total) by mouth 2 (two) times daily.  15 tablet  0  . Ipratropium-Albuterol (COMBIVENT RESPIMAT) 20-100 MCG/ACT AERS respimat Inhale 1 puff into the lungs 2 (two) times daily as needed for wheezing or shortness of breath.       . lactose free nutrition (BOOST) LIQD Take 237 mLs by mouth daily as needed (protein supplement).      . metoprolol tartrate (LOPRESSOR) 25 MG tablet Take 25 mg by mouth See admin instructions. Take 1 tablet (25 mg) every morning, may take an additional tablet in the evening if blood pressure is over 160      . Nutritional Supplements (FEEDING SUPPLEMENT, NEPRO CARB STEADY,) LIQD Take 237 mLs by mouth 2 (two) times daily between meals.  60 Can  0  . ondansetron (ZOFRAN)  8 MG tablet Take 8 mg by mouth 2 (two) times daily as needed for nausea or vomiting.      . pantoprazole (PROTONIX) 40 MG tablet Take 1 tablet (40 mg total) by mouth 2 (two) times daily.  60 tablet  1  . sevelamer (RENVELA) 800 MG tablet Take 800-2,400 mg by mouth See admin instructions. Take 1-2 tablets [phone] mg) with snacks, and 3 tablets (2400 mg) with meals      . sucralfate (CARAFATE) 1 GM/10ML suspension  Take 1 g by mouth See admin instructions. Take 10 mls (2 grams) twice daily, may take an additional dose at bedtime if needed for acid indigestion      . Tetrahydrozoline HCl (VISINE OP) Place 1 drop into both eyes 2 (two) times daily as needed (blurriness/ dry eyes).       No current facility-administered medications for this encounter.      Physical Exam:   BP 110/66  Pulse 102  Resp 19  Ht 5\' 6"  (1.676 m)  Wt 135 lb (61.236 kg)  BMI 21.80 kg/m2  SpO2 96%  General:  Elderly and somewhat frail  Chest:   Fairly clear, somewhat diminished right apex  CV:   RRR w/ harsh IV/VI crescendo/decrescendo systolic murmur  Incisions:  n/a  Abdomen:  soft  Extremities:  warm  Diagnostic Tests:  TRANSTHORACIC ECHOCARDIOGRAM  Both images and report of transthoracic echocardiogram performed at [facility] 08/21/2013 are reviewed.  There was severe calcific aortic stenosis. Aortic valve appears tricuspid. All 3 leaflets are restricted. Peak velocity across the aortic valve measured 4.1 m/s corresponding to peak and mean transvalvular gradients of 69 and 45 mm mercury respectively. There was moderate global left ventricular systolic dysfunction with ejection fraction estimated 40-45%. There is moderate left ventricular hypertrophy. There is mild mitral regurgitation and mild mitral stenosis.    CARDIAC CATHETERIZATION  Both images and report of diagnostic cardiac catheterization performed at [facility] are reviewed.  Left ventriculogram was not performed in transvalvular gradient across the aortic valve was not measured. There was diffuse calcification of all of the epicardial coronary arteries but overall only mild nonobstructive disease with exception of possible ostial stenosis of the left main coronary artery. This was not easily visualized on catheterization but by report there was catheter damping with engagement, and the degree of stenosis was  subsequently estimated to be 70%. There was right dominant coronary circulation.Pulmonary artery pressures were normal at 28/10 with pulmonary capillary wedge pressure 11 and central venous pressure 3. Fick cardiac output was measured 7.97 L per minute corresponding to a cardiac index of 4.84.    CT ANGIOGRAPHY CHEST WITH CONTRAST   TECHNIQUE:   Multidetector CT imaging of the chest was performed using the   standard protocol during bolus administration of intravenous   contrast. Multiplanar CT image reconstructions including MIPs were   obtained to evaluate the vascular anatomy.   CONTRAST: 156mL OMNIPAQUE IOHEXOL 350 MG/ML SOLN   COMPARISON: DG CHEST 2 VIEW dated 08/25/2013; CT ANGIO CHEST dated   05/29/2013   FINDINGS:   Contrast within the pulmonary arterial tree is normal in appearance.   There are no filling defects to suggest an acute pulmonary embolism.   The caliber of the thoracic aorta is normal. No false lumen is   demonstrated. The cardiac chambers are mildly enlarged and stable.   There is no pericardial or pleural effusion. Coronary artery   calcifications are demonstrated. Calcifications of the aortic  and   mitral valve are stable. No bulky mediastinal or hilar lymph nodes   are demonstrated. The caliber of the thoracic esophagus appears   normal.   There is abnormal parenchymal consolidation in the right upper lobe   consistent with a thick-walled cavitary lesion. Allowing for   differences in positioning this has not significantly changed since   the previous study. Density previously demonstrated in the posterior   superior aspect of the right lower lobe has improved and increased   interstitial density posteriorly in the left lower lobe is less   prominent today.   Within the upper abdomen the observed portions of the liver and   spleen appear normal. The right kidney is atrophic where visualized.   Calcification of the visualized portions of the left adrenal gland     is demonstrated. Calcification of the pleura over the hemidiaphragms   is stable.   The observed portions of the ribs exhibit no acute abnormalities.   The thoracic vertebral bodies are preserved in height with the   exception of T5 which exhibits mild anterior wedging which is   stable. There is prominent thoracic kyphosis.   Review of the MIP images confirms the above findings.   IMPRESSION:   1. There is no evidence of an acute pulmonary embolism or acute   thoracic aortic pathology.   2. The small bilateral pleural effusions seen on the study of   November 2014 have resolved.   3. There is stable increased density in the right upper lobe. Patchy   interstitial density in the lower lobes bilaterally has resolved.   4. Extensive pleural calcifications are present bilaterally.   Electronically Signed   By: David Martinique   On: 08/25/2013 15:22    Heparin Induced Plt Ab Negative Negative Final     (NOTE) This test is approximately 95% sensitive for detecting antibodies to the Platelet Factor 4/Heparin Complex (PF4/Heparin). Although the result of this patient's sample is negative, a small percentage of patients may still be positive by the Serotonin Release Assay (SRA), possibly due to other target antigens involved in this reaction such as Interleukin-8 or Neutrophil- Activating Peptide-2 (NAP-2). A clinical risk assessment score may provide additional guidance in deciding whether additional testing is of value (J Thromb Haemost 2008;6:1304-12).    Patient O.D. 0.094   Final    (NOTE) ______________________________________ !   O.D.units        !    Result       ! !____________________!_________________! ! <=0.300            !   Negative      ! !  >0.300 to <=0.500 !  Weak Positive  ! !  >0.500            !   Positive      ! !____________________!_________________! For more information on this test, go to: http://education.questdiagnostics.com/faq/FAQ06V1    UFH SRA  Result Negative Negative Final    (NOTE) A sample is considered negative if there is <20% release. The SRA has a sensitivity (88-100%) and specificity (89-100%) for Heparin Induced Thrombocytopenia (HIT) Rondel Baton, Hematology Am Soc Hematol Educ Program. 2009; 225-232).    UFH Low Dose 0.1 IU/mL 0 % Release Final    UFH Low Dose 0.5 IU/mL 0 % Release Final    UFH High Dose UFH H 0 % Release Final    (NOTE) This test was developed and its performance characteristics have been determined by [employer]  [place], [place], [place]. It has not been cleared or approved by the U.S. Food and Drug Administration. The FDA has determined that such clearance or approval is not necessary. Performance characteristics refer to the analytical performance of the test. For more information on this test, go to: http://education.questdiagn[facility].com/faq/FAQ06V1 Performed at [facility]         Cardiac Catheterization Procedure Note  Name: JACEY PELC MRN: [id number] DOB: 02/20/1941  Procedure: Left Heart Cath, Selective Coronary Angiography, LV angiography  Indication: Borderline left main lesion  Procedural details: The right groin was prepped, draped, and anesthetized with 1% lidocaine. Using modified Seldinger technique, a 6 French sheath was introduced into the right femoral artery. Angiomax was used for anticoagulation. A therapeutic ACT was achieved. A 6 French JL 3.5 cm guide catheter with sideholes was utilized. A cougar guidewire was advanced into the distal LAD. Intravascular ultrasound was performed of the left main using a mechanical pullback technique. The procedure was uncomplicated.  A pigtail catheter was then advanced into the aortic root and a aortic root angiogram was performed. The catheter was pulled back into the abdominal aorta and an abdominal aortic angiogram was performed. The procedure was uncomplicated and the patient tolerated it well. There was a small,  soft hematoma present around the sheath the completion of the procedure. Manual pressure will be used for hemostasis.  Procedural Findings:               Coronary angiography: Coronary dominance: right  The left main stem is heavily calcified. All the coronaries are very severely calcified on plain fluoroscopy. The aortic valve is severely calcified and restricted on plain fluoroscopy.  Intravascular ultrasound findings: There is heavy calcification of 180 of the left main. This is primarily in the mid and distal left main. The proximal left main is ovoid in shape. There is no significant stenosis noted throughout the left main. The ostium is patent without significant narrowing.  Aortic root angiogram: The aortic valve appears trileaflet. There is heavy leaflet calcification. Annular calcification is also present. There is no significant aortic insufficiency noted.  Abdominal aortic angiogram: AAA repair site is intact. There is there is a bifurcated graft present in the aortoiliac region that is widely patent. The right external iliac is diffusely diseased without high-grade stenosis. There is heavy femoral artery calcification on the right. On the left, the external iliac is patent throughout. There is some dilatation present. The internal iliac is patent. The common femoral is patent.  Final Conclusions:   1. Heavily calcified but nonobstructive left main plaque 2. Known severe aortic stenosis 3. Intact AAA repair with patent graft and calcified but nonobstructive right external iliac disease and patent left iliofemoral vessels  Recommendations: Continue workup for TAVR  Sherren Mocha 08/28/2013, 3:26 PM    Cardiac TAVR CT   TECHNIQUE: The patient was scanned on a Philips 256 scanner. A 120 kV retrospective scan was triggered in the descending thoracic aorta at 111 HU's. Gantry rotation speed was 270 msecs and collimation was .9 mm. 10 mg of iv Metoprolol and no nitro was given.  The 3D data set was reconstructed in 5% intervals of the R-R cycle. Systolic and diastolic phases were analyzed on a dedicated work station using MPR, MIP and VRT modes. The patient received 80 cc of contrast.   FINDINGS: Aortic Valve: Moderately thickened and severely calcified with limited but still somewhat preserved leaflet opening.   Aorta: Mild aortic calcifications in the ascending and descending thoracic  aorta and moderate calcifications in the aortic arch predominantly at the origin of the brachiocephalic arteries.   Sinotubular Junction:  29 x 29 mm   Ascending Thoracic Aorta:  32 x 31 mm   Aortic Arch:  27 x 27 mm   Descending Thoracic Aorta:  26 x 26 mm   Sinus of Valsalva Measurements:   Non-coronary:  34 mm   Right -coronary:  35 mm   Left -coronary:  35 mm   Coronary Artery Height above Annulus:   Left Main:  11 mm   Right Coronary:  18 mm   Virtual Basal Annulus Measurements:   Maximum/Minimum Diameter:  25.7 x 29.6 mm   Perimeter:  91 mm   Area:  5.8 cm2   Coronary Arteries: Heavily calcified, the study is insufficient for coronary artery read, no nitro was given.   Optimum Fluoroscopic Angle for Delivery:  LAO 10, CAU 10.   Distance from annulus to sheath tip is 70 mm (>60 mm required for 29 mm valve).   IMPRESSION: 1. Severely calcified aortic valve with measurement suitable for 29 mm Edwards-Sapien Valve.   2.  Optimum deployment angle is LAO 10, CAU 10.   3.  Sufficient distance from annulus to coronary arteries.   4. Sufficient distance from annulus to sheath tip for transaortic approach.   Ena Dawley     Electronically Signed   By: Ena Dawley   On: 09/02/2013 19:20       Study Result    EXAM: OVER-READ INTERPRETATION  CT CHEST   The following report is an over-read performed by radiologist Dr. Rebekah Chesterfield [facility] on 09/01/2013. This over-read does not include interpretation of cardiac  or coronary anatomy or pathology. The interpretation by the cardiologist is attached.   COMPARISON:  CTA of the chest, abdomen and pelvis 08/29/2013. CT of the chest 05/29/2013.   FINDINGS: Please see separate dictation for CTA of the chest, abdomen and pelvis from 08/29/2013 for full description of extracardiac findings.   IMPRESSION: Please see separate dictation for CTA of the chest, abdomen and pelvis from 08/29/2013 for full description of extracardiac findings.   Electronically Signed: By: Vinnie Langton M.D. On: 09/01/2013 08:37      CT ANGIOGRAPHY CHEST, ABDOMEN AND PELVIS   TECHNIQUE: Multidetector CT imaging through the chest, abdomen and pelvis was performed using the standard protocol during bolus administration of intravenous contrast. Multiplanar reconstructed images and MIPs were obtained and reviewed to evaluate the vascular anatomy.   CONTRAST:  70 mL of Omnipaque 350.   COMPARISON:  PET-CT 06/11/2013.   FINDINGS: CT CHEST FINDINGS   Mediastinum: Heart size is borderline enlarged. There is likely concentric left ventricular hypertrophy. There is no significant pericardial fluid, thickening or pericardial calcification. There is atherosclerosis of the thoracic aorta, the great vessels of the mediastinum and the coronary arteries, including calcified atherosclerotic plaque in the left main, left anterior descending, left circumflex and right coronary arteries. Severe calcifications of the aortic valve. Calcified right peritracheal lymph nodes. Prominent soft tissue in the right hilar and suprahilar region, similar to prior studies, without well definable lymphadenopathy. Esophagus is unremarkable in appearance. Left innominate vein stent extending into the superior vena cava.   Lungs/Pleura: Area of masslike consolidation, chronic volume loss, architectural distortion and cavitation in the right apex, similar to prior examinations (recently  demonstrated to have peripheral hypermetabolism on PET-CT 06/11/2013). No acute consolidative airspace disease in the remainder of the lungs. Chronic trace left  pleural effusion is unchanged. Numerous calcified pleural plaques throughout the thorax bilaterally. No definite new suspicious appearing pulmonary nodules.   Musculoskeletal: There are no aggressive appearing lytic or blastic lesions noted in the visualized portions of the skeleton.   CT ABDOMEN AND PELVIS FINDINGS   Abdomen/Pelvis: The appearance of the liver, gallbladder, pancreas, spleen and right adrenal gland is unremarkable. Calcifications in the left adrenal gland may relate to prior infection or hemorrhage. Unusual calcifications associated with the lower pole of the left kidney and the surrounding retroperitoneal fat overlying the left psoas muscle are similar to prior examinations, presumably related to prior trauma/hemorrhage. There is extensive scarring throughout the posterior aspect of the left kidney an in the lower pole. The right kidney is severely atrophic.   Trace volume of ascites. No pneumoperitoneum. No pathologic distention of small bowel. Status post left hemicolectomy with right lower quadrant colostomy. No definite lymphadenopathy in the abdomen or pelvis.   Status post aorto bi-iliac bypass graft placement. Vascular findings and measurements pertinent 2 potential TAVR procedure, as described below. In addition, there appears to be significant stenosis at the ostium of the superior mesenteric artery which is narrowed to approximately 3 mm (distal to the area of narrowing common luminal diameter is approximately 9 mm). Single right renal artery appears severely stenotic. Single left renal artery is grossly patent, although the ostium of the vessel is difficult to visualize on today's examination secondary to adjacent graft and severe calcifications. Inferior mesenteric artery appears occluded.     Musculoskeletal: Bilateral pars defects at L5. There are no aggressive appearing lytic or blastic lesions noted in the visualized portions of the skeleton.   VASCULAR MEASUREMENTS PERTINENT TO TAVR:   AORTA:   Minimal Aortic Diameter -  13 x 11 mm   Severity of Aortic Calcification -  Severe   RIGHT PELVIS:   Right Common Iliac Artery -   Minimal Diameter - 4.2 x 3.9 mm   Tortuosity - Mild   Calcification - Severe   Right External Iliac Artery -   Minimal Diameter - 5.3 x 6.4 mm   Tortuosity - Mild   Calcification - Moderate   Right Common Femoral Artery -   Minimal Diameter - 6.5 x 5.4 mm   Tortuosity - Mild   Calcification - Moderate   LEFT PELVIS:   Left Common Iliac Artery -   Minimal Diameter - 5.4 x 5.2 mm   Tortuosity - Mild   Calcification - Severe   Left External Iliac Artery -   Minimal Diameter - 5.5 x 5.4 mm   Tortuosity - Mild   Calcification - Moderate   Left Common Femoral Artery -   Minimal Diameter - 5.7 x 4.6 mm   Tortuosity - Mild   Calcification - Severe   Review of the MIP images confirms the above findings.   IMPRESSION: 1. Vascular findings and measurements pertinent to potential TAVR procedure, as detailed above. This patient does not appear to have suitable pelvic arterial access. 2. Persistent thick-walled cavitary mass like area in the right apex. Given the hypermetabolism on prior PET-CT 06/11/2013, the possibility of residual/recurrent disease in this region is not excluded, however, given the stability of this area over the prior several examinations, the hypermetabolism may simply be related to chronic inflammation from colonization of this cavitary lesion. Continued attention on followup studies is recommended. 3. Bilateral calcified pleural plaques redemonstrated, indicative of asbestos related pleural disease. 4. Concentric left ventricular hypertrophy. 5. Atherosclerosis, including left  main and 3  vessel coronary artery disease. 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 6. Status post left hemicolectomy with right lower quadrant ostomy. 7. Additional incidental findings, as above.     Electronically Signed   By: Vinnie Langton M.D.   On: 08/31/2013 12:57     NUCLEAR MEDICINE PET SKULL BASE TO THIGH   FASTING BLOOD GLUCOSE:  Value: 85 mg/dl   TECHNIQUE: 8.4 mCi F-18 FDG was injected intravenously. Full-ring PET imaging was performed from the skull base to thigh after the radiotracer. CT data was obtained and used for attenuation correction and anatomic localization.   COMPARISON:  NM PET TUM IMG RESTAG (PS) SKULL BASE T - THIGH dated 06/11/2013; CT ANGIO CHEST AORTA W/CM & WO/CM dated 08/29/2013   FINDINGS: NECK   Mixed density right maxillary sinusitis. Cannot exclude a fungal component. There is also probably a component mucus retention cyst. And chronic right ethmoid sinusitis with partial right ethmoidectomy. Chronic right sphenoid sinusitis. No hypermetabolic adenopathy in the neck.   CHEST   Right upper lobe airspace opacity with cavitary process, maximum standard uptake value 3.6, formerly 4.9.   Severe atherosclerosis with cardiomegaly. Low-density blood pool suggests anemia. Stable trace left pleural effusion.   ABDOMEN/PELVIS   No abnormal hypermetabolic activity within the liver, pancreas, adrenal glands, or spleen. No hypermetabolic lymph nodes in the abdomen or pelvis. Focally high activity of the gastric cardia, maximum standard uptake value 10.6, without visible mass in this vicinity on the CT data. Dense calcifications along the hemidiaphragms and  pleural surfaces compatible with asbestos related pleural disease. Stable irregular calcification in the posterior perirenal space on the left, without significant associated abnormal hypermetabolic activity.   Stable extensive atherosclerotic vascular disease. Atrophic kidneys. Small amount of free pelvic fluid, significance uncertain, measuring a fluid density but with maximum standard uptake value of 3.2.   SKELETON   No focal hypermetabolic activity to suggest skeletal metastasis. Bilateral pars defects at L5.   IMPRESSION: 1. Reduced metabolic activity associated with the large right apical cavitary process, favoring inflammatory/infectious etiology over malignancy. 2. Hypermetabolic activity along the gastric cardia. This can be physiologic due to peristalsis, particularly given the lack of prior activity in this vicinity and the lack of discernible mass lesion on the CT data. If clinically warranted, endoscopy could be utilized for further characterization. 3. Severe atherosclerosis. 4. Right-sided sinusitis. Cannot exclude a fungal component of the right maxillary sinusitis. 5. Small amount of pelvic ascites, slightly hypermetabolic, but with fluid density. Appearance abnormal but nonspecific, may be due to inflammatory or neoplastic causes. Amount of fluid increased from 08/29/2013. 6. Chronic bilateral pars defects at L5.     Electronically Signed   By: Sherryl Barters M.D.   On: 09/15/2013 14:26    Pulmonary Function Tests  Baseline      Post-bronchodilator  FVC  2.11 L  (54% predicted) FVC  2.00 L  (51% predicted) FEV1  1.83 L  (64% predicted) FEV1  1.44 L  (50% predicted) FEF25-75 2.48 L  (116% predicted) ? FEF25-75 0.79 L  (37% predicted)  TLC  Not done RV  Not done  DLCO  Not done    STS Risk Calculator  Procedure    AVR  Risk  of Mortality   9.75% Morbidity or Mortality  40.3% Prolonged LOS   22.0% Short LOS    11.6% Permanent  Stroke   2.3% Prolonged Vent Support  30.3% DSW Infection    1.3% Renal Failure    n/a% Reoperation    17.1%    Impression:  The patient has severe symptomatic aortic stenosis with mild to moderate global left ventricular systolic dysfunction.  The patient describes progressive symptoms of congestive heart failure which are for the most part consistent with [place] [employer] functional class III disease, although at times he has had symptoms at rest in the setting of exacerbations of severe iron deficient anemia.  EGD performed last month by Dr. 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 Unfortunately, over the past several weeks the patient has had 2 recurrent episodes of melena requiring repeat hospitalization, one of which was associated with the need for repeat transfusion. The patient denies any further episodes of non-over the past 10 days.  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 The patient has done unremarkably well and remained in clinical remission, and repeat PET/CT scan performed 09/15/2013 appeared stable in comparison with previous scans with no signs of progression of disease and decreased metabolic activity in the right lung field.  At the time of the patient's original diagnostic cardiac catheterization there was some question of the possibility of significant ostial left main coronary artery stenosis, although subsequent repeat catheterization with IVUS suggested that there was no significant flow limiting disease related to the calcified plaque involving the left main coronary artery. The patient does not have any other significant coronary artery disease.    The patient's circumstances are further complicated by the fact that he has a reported allergy  to heparin dating back to his hospitalization at [facility] at which time he apparently had severe thrombocytopenia, although HIT assay last month was NEGATIVE.  Overall I would not consider this patient a candidate for conventional surgical aortic valve replacement under any circumstances.  Transcatheter aortic valve replacement may be a reasonable alternative.  The patient does not have adequate peripheral vascular access for transfemoral approach and would likely best be treated using transapical approach to avoid likely radiation-induced changes involving the ascending thoracic aorta.  At this point question relates to timing of possible TAVR given the fact that the patient has continued to have signs of slow or intermittent chronic GI bleeding presumably related to radiation esophagitis despite the fact that he has not been on any type of anticoagulation over the past month.      Plan:  The patient is seen in the clinic today in conjunction with Dr. Burt Knack. We will ask Dr. Paulita Fujita to see the patient in followup in the GI medicine clinic and consider whether or not repeat EGD would be of value to assess for signs of healing of the patient's esophagitis. Patient's are typically treated with aspirin and Plavix for 6 months after TAVR.  We will have the patient return for followup in 4 weeks and recheck his hemoglobin at that time. All of his questions have been addressed.     Valentina Gu. Roxy Manns, MD 09/25/2013 5:15 PM

## 2013-09-28 ENCOUNTER — Other Ambulatory Visit: Payer: Self-pay | Admitting: *Deleted

## 2013-09-28 DIAGNOSIS — I350 Nonrheumatic aortic (valve) stenosis: Secondary | ICD-10-CM

## 2013-09-29 ENCOUNTER — Ambulatory Visit: Payer: Medicare Other | Admitting: Internal Medicine

## 2013-10-01 ENCOUNTER — Encounter (HOSPITAL_COMMUNITY): Payer: Self-pay | Admitting: Cardiovascular Disease

## 2013-10-01 ENCOUNTER — Ambulatory Visit: Payer: Medicare Other | Admitting: Internal Medicine

## 2013-10-01 NOTE — Progress Notes (Signed)
MULTIDISCIPLINARY VALVE CLINIC NOTE  HPI:  73 year old gentleman presenting for followup evaluation to the multidisciplinary valve clinic. He has an extremely complicated past medical history which includes history of ruptured abdominal aortic aneurysm and complicated surgical repair in 2008. He has had end-stage renal disease since that time. He also required colon resection and has had a colostomy. Other medical problems include non-small cell lung cancer, clinical stage IIIB. He's been treated with radiation and chemotherapy. His disease has actually been stable and he has undergone recent pet imaging.  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 He had a non-ST elevation infarction during that hospitalization underwent cardiac catheterization. He was found to have nonobstructive CAD. However, he has been noted to have severe aortic stenosis and has been referred for consideration of TAVR.  He describes several months of worsening shortness of breath with exertion. He is short of breath now with low-level exertion. He denies chest pain or pressure, orthopnea, PND, lightheadedness, or syncope. Since hospital discharge on February 6, he has been admitted 2 more times with melena in his colostomy bag. He HAs required additional blood transfusion. He has not undergone repeat endoscopy.  Allergies  Allergen Reactions  . Heparin     Patient had prolonged thrombocytopenia following emergency repair of ruptured AAA in 2008 leading to concerns regarding possible HIT, although no lab verification and the patient recovered - HIT assay performed 08/26/2013 NEGATIVE  . Shellfish Allergy Other (See Comments)    Throat swelled/ choking  . Tetanus Toxoids Swelling    Arm swelled up    Past Medical History  Diagnosis Date  . Colostomy in place   . Abdominal aortic aneurysm 02/2007    Ruptured, hospitalized at [facility] for 52 days, c/b loss of colon and renal function.  Followed by Dr Ronalee Belts  . Hyperlipidemia   . Atherosclerosis   . Esophageal reflux   . Hypertension   . PSVT (paroxysmal supraventricular tachycardia)   . Aortic stenosis   . Lung cancer 2009    Initial clinical stage IIIB - treated with radiation + chemotherapy  . Heart murmur   . ESRD (end stage renal disease) on dialysis     "[facility]; [place]; [place], Wed & Fri,  Dr Holley Raring" (08/24/2013)  . Exertional shortness of breath   . Anemia   . History of blood transfusion 2008    "related to ruptured AAA" (08/24/2013)  . Arthritis     "fingers"   . GERD (gastroesophageal reflux disease)   . Chronic diastolic congestive heart failure   . S/P radiation therapy > 12 wks ago   . S/P chemotherapy, time since greater than 12 weeks   . 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 Hypoalbuminemia   . Anemia in chronic kidney disease   . GI (gastrointestinal bleed)   . Heparin allergy   . Non-STEMI (non-ST elevated myocardial infarction)   . Malnutrition     ROS: Negative except as per HPI  BP 110/66  Pulse 102  Resp 19  Ht 5\' 6"  (1.676 m)  Wt 135 lb (61.236 kg)  BMI 21.80 kg/m2  SpO2 96%  PHYSICAL EXAM: Pt is alert and oriented, chronically ill-appearing male in NAD HEENT: normal Neck: JVP - normal, carotids delayed with bilateral bruits Lungs: Fair air movement bilaterally, no inspiratory crackles CV: RRR with harsh grade 3/6 systolic murmur at the left sternal border Abd: soft, NT, Positive BS, no hepatomegaly Ext: no C/C/E, distal pulses intact and  equal Skin: warm/dry no rash  CT ANGIOGRAPHY CHEST WITH CONTRAST  TECHNIQUE:  Multidetector CT imaging of the chest was performed using the  standard protocol during bolus administration of intravenous  contrast. Multiplanar CT image reconstructions including MIPs were  obtained to evaluate the vascular anatomy.  CONTRAST: 114mL OMNIPAQUE IOHEXOL 350 MG/ML SOLN  COMPARISON: DG CHEST 2 VIEW dated 08/25/2013; CT ANGIO CHEST dated    05/29/2013  FINDINGS:  Contrast within the pulmonary arterial tree is normal in appearance.  There are no filling defects to suggest an acute pulmonary embolism.  The caliber of the thoracic aorta is normal. No false lumen is  demonstrated. The cardiac chambers are mildly enlarged and stable.  There is no pericardial or pleural effusion. Coronary artery  calcifications are demonstrated. Calcifications of the aortic and  mitral valve are stable. No bulky mediastinal or hilar lymph nodes  are demonstrated. The caliber of the thoracic esophagus appears  normal.  There is abnormal parenchymal consolidation in the right upper lobe  consistent with a thick-walled cavitary lesion. Allowing for  differences in positioning this has not significantly changed since  the previous study. Density previously demonstrated in the posterior  superior aspect of the right lower lobe has improved and increased  interstitial density posteriorly in the left lower lobe is less  prominent today.  Within the upper abdomen the observed portions of the liver and  spleen appear normal. The right kidney is atrophic where visualized.  Calcification of the visualized portions of the left adrenal gland  is demonstrated. Calcification of the pleura over the hemidiaphragms  is stable.  The observed portions of the ribs exhibit no acute abnormalities.  The thoracic vertebral bodies are preserved in height with the  exception of T5 which exhibits mild anterior wedging which is  stable. There is prominent thoracic kyphosis.  Review of the MIP images confirms the above findings.  IMPRESSION:  1. There is no evidence of an acute pulmonary embolism or acute  thoracic aortic pathology.  2. The small bilateral pleural effusions seen on the study of  November 2014 have resolved.  3. There is stable increased density in the right upper lobe. Patchy  interstitial density in the lower lobes bilaterally has resolved.  4.  Extensive pleural calcifications are present bilaterally.  Electronically Signed  By: David Martinique  On: 08/25/2013 15:22  Heparin Induced Plt Ab  Negative  Negative  Final     (NOTE) This test is approximately 95% sensitive for detecting antibodies to the Platelet Factor 4/Heparin Complex (PF4/Heparin). Although the result of this patient's sample is negative, a small percentage of patients may still be positive by the Serotonin Release Assay (SRA), possibly due to other target antigens involved in this reaction such as Interleukin-8 or Neutrophil- Activating Peptide-2 (NAP-2). A clinical risk assessment score may provide additional guidance in deciding whether additional testing is of value (J Thromb Haemost 2008;6:1304-12).    Patient O.D.  0.094   Final    (NOTE) ______________________________________ ! O.D.units ! Result ! !____________________!_________________! ! <=0.300 ! Negative ! ! >0.300 to <=0.500 ! Weak Positive ! ! >0.500 ! Positive ! !____________________!_________________! For more information on this test, go to: http://education.questdiagnostics.com/faq/FAQ06V1    UFH SRA Result  Negative  Negative  Final    (NOTE) A sample is considered negative if there is <20% release. The SRA has a sensitivity (88-100%) and specificity (89-100%) for Heparin Induced Thrombocytopenia (HIT) Rondel Baton, Hematology Am Soc Hematol Educ Program. 2009; 225-232).  [facility] Low Dose 0.1 IU/mL  0  % Release  Final    [facility] Low Dose 0.5 IU/mL  0  % Release  Final    UFH High Dose UFH H  0  % Release  Final    (NOTE) This test was developed and its performance characteristics have been determined by [employer], [place], [place]. It has not been cleared or approved by the U.S. Food and Drug Administration. The FDA has determined that such clearance or approval is not necessary. Performance characteristics refer to the analytical performance of the  test. For more information on this test, go to: http://education.questdiagnostics.com/faq/FAQ06V1 Performed at [facility]     Cardiac Catheterization Procedure Note  Name: ASHELY JOSHUA  MRN: [id number]  DOB: 17-Apr-1941  Procedure: Left Heart Cath, Selective Coronary Angiography, LV angiography  Indication: Borderline left main lesion  Procedural details: The right groin was prepped, draped, and anesthetized with 1% lidocaine. Using modified Seldinger technique, a 6 French sheath was introduced into the right femoral artery. Angiomax was used for anticoagulation. A therapeutic ACT was achieved. A 6 French JL 3.5 cm guide catheter with sideholes was utilized. A cougar guidewire was advanced into the distal LAD. Intravascular ultrasound was performed of the left main using a mechanical pullback technique. The procedure was uncomplicated.  A pigtail catheter was then advanced into the aortic root and a aortic root angiogram was performed. The catheter was pulled back into the abdominal aorta and an abdominal aortic angiogram was performed. The procedure was uncomplicated and the patient tolerated it well. There was a small, soft hematoma present around the sheath the completion of the procedure. Manual pressure will be used for hemostasis.  Procedural Findings:  Coronary angiography:  Coronary dominance: right  The left main stem is heavily calcified. All the coronaries are very severely calcified on plain fluoroscopy. The aortic valve is severely calcified and restricted on plain fluoroscopy.  Intravascular ultrasound findings: There is heavy calcification of 180 of the left main. This is primarily in the mid and distal left main. The proximal left main is ovoid in shape. There is no significant stenosis noted throughout the left main. The ostium is patent without significant narrowing.  Aortic root angiogram: The aortic valve appears trileaflet. There is heavy leaflet calcification. Annular calcification  is also present. There is no significant aortic insufficiency noted.  Abdominal aortic angiogram: AAA repair site is intact. There is there is a bifurcated graft present in the aortoiliac region that is widely patent. The right external iliac is diffusely diseased without high-grade stenosis. There is heavy femoral artery calcification on the right. On the left, the external iliac is patent throughout. There is some dilatation present. The internal iliac is patent. The common femoral is patent.  Final Conclusions:  1. Heavily calcified but nonobstructive left main plaque  2. Known severe aortic stenosis  3. Intact AAA repair with patent graft and calcified but nonobstructive right external iliac disease and patent left iliofemoral vessels  Recommendations: Continue workup for TAVR  Sherren Mocha  08/28/2013, 3:26 PM  Cardiac TAVR CT  TECHNIQUE:  The patient was scanned on a Philips 256 scanner. A 120 kV  retrospective scan was triggered in the descending thoracic aorta at  111 HU's. Gantry rotation speed was 270 msecs and collimation was .9  mm. 10 mg of iv Metoprolol and no nitro was given. The 3D data set  was reconstructed in 5% intervals of the R-R cycle. Systolic and  diastolic phases  were analyzed on a dedicated work station using  MPR, MIP and VRT modes. The patient received 80 cc of contrast.  FINDINGS:  Aortic Valve: Moderately thickened and severely calcified with  limited but still somewhat preserved leaflet opening.  Aorta: Mild aortic calcifications in the ascending and descending  thoracic aorta and moderate calcifications in the aortic arch  predominantly at the origin of the brachiocephalic arteries.  Sinotubular Junction: 29 x 29 mm  Ascending Thoracic Aorta: 32 x 31 mm  Aortic Arch: 27 x 27 mm  Descending Thoracic Aorta: 26 x 26 mm  Sinus of Valsalva Measurements:  Non-coronary: 34 mm  Right -coronary: 35 mm  Left -coronary: 35 mm  Coronary Artery Height above  Annulus:  Left Main: 11 mm  Right Coronary: 18 mm  Virtual Basal Annulus Measurements:  Maximum/Minimum Diameter: 25.7 x 29.6 mm  Perimeter: 91 mm  Area: 5.8 cm2  Coronary Arteries: Heavily calcified, the study is insufficient for  coronary artery read, no nitro was given.  Optimum Fluoroscopic Angle for Delivery: LAO 10, CAU 10.  Distance from annulus to sheath tip is 70 mm (>60 mm required for 29  mm valve).  IMPRESSION:  1. Severely calcified aortic valve with measurement suitable for 29  mm Edwards-Sapien Valve.  2. Optimum deployment angle is LAO 10, CAU 10.  3. Sufficient distance from annulus to coronary arteries.  4. Sufficient distance from annulus to sheath tip for transaortic  approach.  Ena Dawley  Electronically Signed  By: Ena Dawley  On: 09/02/2013 19:20     Study Result    EXAM:  OVER-READ INTERPRETATION CT CHEST  The following report is an over-read performed by radiologist Dr.  Rebekah [facility] on 09/01/2013. This  over-read does not include interpretation of cardiac or coronary  anatomy or pathology. The interpretation by the cardiologist is  attached.  COMPARISON: CTA of the chest, abdomen and pelvis 08/29/2013. CT of  the chest 05/29/2013.  FINDINGS:  Please see separate dictation for CTA of the chest, abdomen and  pelvis from 08/29/2013 for full description of extracardiac  findings.  IMPRESSION:  Please see separate dictation for CTA of the chest, abdomen and  pelvis from 08/29/2013 for full description of extracardiac  findings.  Electronically Signed:  By: Vinnie Langton M.D.  On: 09/01/2013 08:37   CT ANGIOGRAPHY CHEST, ABDOMEN AND PELVIS  TECHNIQUE:  Multidetector CT imaging through the chest, abdomen and pelvis was  performed using the standard protocol during bolus administration of  intravenous contrast. Multiplanar reconstructed images and MIPs were  obtained and reviewed to evaluate the vascular  anatomy.  CONTRAST: 70 mL of Omnipaque 350.  COMPARISON: PET-CT 06/11/2013.  FINDINGS:  CT CHEST FINDINGS  Mediastinum: Heart size is borderline enlarged. There is likely  concentric left ventricular hypertrophy. There is no significant  pericardial fluid, thickening or pericardial calcification. There is  atherosclerosis of the thoracic aorta, the great vessels of the  mediastinum and the coronary arteries, including calcified  atherosclerotic plaque in the left main, left anterior descending,  left circumflex and right coronary arteries. Severe calcifications  of the aortic valve. Calcified right peritracheal lymph nodes.  Prominent soft tissue in the right hilar and suprahilar region,  similar to prior studies, without well definable lymphadenopathy.  Esophagus is unremarkable in appearance. Left innominate vein stent  extending into the superior vena cava.  Lungs/Pleura: Area of masslike consolidation, chronic volume loss,  architectural distortion and cavitation in the right apex, similar  to  prior examinations (recently demonstrated to have peripheral  hypermetabolism on PET-CT 06/11/2013). No acute consolidative  airspace disease in the remainder of the lungs. Chronic trace left  pleural effusion is unchanged. Numerous calcified pleural plaques  throughout the thorax bilaterally. No definite new suspicious  appearing pulmonary nodules.  Musculoskeletal: There are no aggressive appearing lytic or blastic  lesions noted in the visualized portions of the skeleton.  CT ABDOMEN AND PELVIS FINDINGS  Abdomen/Pelvis: The appearance of the liver, gallbladder, pancreas,  spleen and right adrenal gland is unremarkable. Calcifications in  the left adrenal gland may relate to prior infection or hemorrhage.  Unusual calcifications associated with the lower pole of the left  kidney and the surrounding retroperitoneal fat overlying the left  psoas muscle are similar to prior examinations,  presumably related  to prior trauma/hemorrhage. There is extensive scarring throughout  the posterior aspect of the left kidney an in the lower pole. The  right kidney is severely atrophic.  Trace volume of ascites. No pneumoperitoneum. No pathologic  distention of small bowel. Status post left hemicolectomy with right  lower quadrant colostomy. No definite lymphadenopathy in the abdomen  or pelvis.  Status post aorto bi-iliac bypass graft placement. Vascular findings  and measurements pertinent 2 potential TAVR procedure, as described  below. In addition, there appears to be significant stenosis at the  ostium of the superior mesenteric artery which is narrowed to  approximately 3 mm (distal to the area of narrowing common luminal  diameter is approximately 9 mm). Single right renal artery appears  severely stenotic. Single left renal artery is grossly patent,  although the ostium of the vessel is difficult to visualize on  today's examination secondary to adjacent graft and severe  calcifications. Inferior mesenteric artery appears occluded.  Musculoskeletal: Bilateral pars defects at L5. There are no  aggressive appearing lytic or blastic lesions noted in the  visualized portions of the skeleton.  VASCULAR MEASUREMENTS PERTINENT TO TAVR:  AORTA:  Minimal Aortic Diameter - 13 x 11 mm  Severity of Aortic Calcification - Severe  RIGHT PELVIS:  Right Common Iliac Artery -  Minimal Diameter - 4.2 x 3.9 mm  Tortuosity - Mild  Calcification - Severe  Right External Iliac Artery -  Minimal Diameter - 5.3 x 6.4 mm  Tortuosity - Mild  Calcification - Moderate  Right Common Femoral Artery -  Minimal Diameter - 6.5 x 5.4 mm  Tortuosity - Mild  Calcification - Moderate  LEFT PELVIS:  Left Common Iliac Artery -  Minimal Diameter - 5.4 x 5.2 mm  Tortuosity - Mild  Calcification - Severe  Left External Iliac Artery -  Minimal Diameter - 5.5 x 5.4 mm  Tortuosity - Mild  Calcification  - Moderate  Left Common Femoral Artery -  Minimal Diameter - 5.7 x 4.6 mm  Tortuosity - Mild  Calcification - Severe  Review of the MIP images confirms the above findings.  IMPRESSION:  1. Vascular findings and measurements pertinent to potential TAVR  procedure, as detailed above. This patient does not appear to have  suitable pelvic arterial access.  2. Persistent thick-walled cavitary mass like area in the right  apex. Given the hypermetabolism on prior PET-CT 06/11/2013, the  possibility of residual/recurrent disease in this region is not  excluded, however, given the stability of this area over the prior  several examinations, the hypermetabolism may simply be related to  chronic inflammation from colonization of this cavitary lesion.  Continued attention on followup studies  is recommended.  3. Bilateral calcified pleural plaques redemonstrated, indicative of  asbestos related pleural disease.  4. Concentric left ventricular hypertrophy.  5. Atherosclerosis, including left main and 3 vessel coronary artery  disease. 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 Status post aorto bi-iliac graft repair for prior abdominal  aortic aneurysm.  6. Status post left hemicolectomy with right lower quadrant ostomy.  7. Additional incidental findings, as above.  Electronically Signed  By: Vinnie Langton M.D.  On: 08/31/2013 12:57   ASSESSMENT AND PLAN: 73 year old medically complex gentleman with comorbid conditions as outlined above, with severe symptomatic aortic stenosis. The patient has [place] Heart Association functional class III symptoms primarily limited by exertional shortness of breath. He is not a candidate for open cardiac surgery because of his severe  comorbidities. Transcatheter aortic valve replacement is a reasonable treatment alternative in this gentleman who has survived many severe illnesses and continues to be functionally independent. 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 While he has undergone full evaluation for TAVR, we feel it is best that he undergo further GI evaluation by Dr. Paulita Fujita to make sure that he is stable from a bleeding perspective prior to undergoing TAVR. He will require bivalirudin for valve surgery and antiplatelet Rx after TAVR, probably with either ASA or plavix alone. He will return for follow-up in 4 weeks after he is seen by Dr Paulita Fujita. We appreciate his consultation in advance.   Sherren Mocha 10/01/2013 9:20 AM

## 2013-10-07 ENCOUNTER — Ambulatory Visit: Payer: Self-pay | Admitting: Oncology

## 2013-10-13 ENCOUNTER — Ambulatory Visit: Payer: Medicare Other | Admitting: Internal Medicine

## 2013-10-15 ENCOUNTER — Encounter (HOSPITAL_COMMUNITY): Payer: Self-pay | Admitting: *Deleted

## 2013-10-19 ENCOUNTER — Ambulatory Visit: Payer: Medicare Other | Admitting: Thoracic Surgery (Cardiothoracic Vascular Surgery)

## 2013-10-22 ENCOUNTER — Ambulatory Visit (INDEPENDENT_AMBULATORY_CARE_PROVIDER_SITE_OTHER): Payer: Medicare Other | Admitting: Internal Medicine

## 2013-10-22 ENCOUNTER — Encounter: Payer: Self-pay | Admitting: Internal Medicine

## 2013-10-22 VITALS — BP 124/98 | HR 98 | Temp 98.2°F | Wt 130.0 lb

## 2013-10-22 DIAGNOSIS — N185 Chronic kidney disease, stage 5: Secondary | ICD-10-CM

## 2013-10-22 DIAGNOSIS — K922 Gastrointestinal hemorrhage, unspecified: Secondary | ICD-10-CM

## 2013-10-22 DIAGNOSIS — I359 Nonrheumatic aortic valve disorder, unspecified: Secondary | ICD-10-CM

## 2013-10-22 DIAGNOSIS — I350 Nonrheumatic aortic (valve) stenosis: Secondary | ICD-10-CM

## 2013-10-22 LAB — CBC WITH DIFFERENTIAL/PLATELET
Basophils Absolute: 0 10*3/uL (ref 0.0–0.1)
Basophils Relative: 0.2 % (ref 0.0–3.0)
EOS ABS: 0.2 10*3/uL (ref 0.0–0.7)
Eosinophils Relative: 2.1 % (ref 0.0–5.0)
HCT: 36.6 % — ABNORMAL LOW (ref 39.0–52.0)
Hemoglobin: 11.6 g/dL — ABNORMAL LOW (ref 13.0–17.0)
Lymphocytes Relative: 4.8 % — ABNORMAL LOW (ref 12.0–46.0)
Lymphs Abs: 0.4 10*3/uL — ABNORMAL LOW (ref 0.7–4.0)
MCHC: 31.8 g/dL (ref 30.0–36.0)
MCV: 96.6 fl (ref 78.0–100.0)
MONO ABS: 0.9 10*3/uL (ref 0.1–1.0)
Monocytes Relative: 9.5 % (ref 3.0–12.0)
NEUTROS PCT: 83.4 % — AB (ref 43.0–77.0)
Neutro Abs: 7.7 10*3/uL (ref 1.4–7.7)
Platelets: 212 10*3/uL (ref 150.0–400.0)
RBC: 3.79 Mil/uL — AB (ref 4.22–5.81)
RDW: 17.5 % — AB (ref 11.5–14.6)
WBC: 9.3 10*3/uL (ref 4.5–10.5)

## 2013-10-22 MED ORDER — METOPROLOL TARTRATE 25 MG PO TABS: 25.0000 mg | ORAL_TABLET | Freq: Two times a day (BID) | ORAL | Status: AC

## 2013-10-22 NOTE — Assessment & Plan Note (Signed)
Progressive symptoms of shortness of breath. Currently undergoing evaluation for AVR. Awaiting GI clearance. Continue metoprolol, amlodipine.

## 2013-10-22 NOTE — Progress Notes (Signed)
Subjective:    Patient ID: Gerald Cooke, male    DOB: Dec 06, 1940, 73 y.o.   MRN: [id number]  HPI 73YO male presents for follow up after recent hospitalization.   Aortic stenosis - Currently under evaluation for AVR. No current shortness of breath or chest pain at rest. Some shortness of breath with exertion, such as walking a short distance. Awaiting GI clearance prior to potential surgery.  GI Bleeding - s/p recent hospitalization for upper GI bleeding. S/p cauterization. Plans for EGD 4/9 with Dr. Paulita Fujita. He reports that stool in colostomy has returned to brownish color. No black or grossly bloody stool.  Review of Systems  Constitutional: Positive for fatigue. Negative for fever, chills, activity change, appetite change and unexpected weight change.  Eyes: Negative for visual disturbance.  Respiratory: Positive for shortness of breath. Negative for cough.   Cardiovascular: Negative for chest pain, palpitations and leg swelling.  Gastrointestinal: Negative for nausea, vomiting, abdominal pain, blood in stool and abdominal distention.  Genitourinary: Negative for dysuria, urgency and difficulty urinating.  Musculoskeletal: Negative for arthralgias and gait problem.  Skin: Negative for color change and rash.  Hematological: Negative for adenopathy.  Psychiatric/Behavioral: Negative for sleep disturbance and dysphoric mood. The patient is not nervous/anxious.        Objective:    BP 124/98  Pulse 98  Temp(Src) 98.2 F (36.8 C) (Oral)  Wt 130 lb (58.968 kg)  SpO2 97% Physical Exam  Constitutional: He is oriented to person, place, and time. He appears well-developed and well-nourished. No distress.  HENT:  Head: Normocephalic and atraumatic.  Right Ear: External ear normal.  Left Ear: External ear normal.  Nose: Nose normal.  Mouth/Throat: Oropharynx is clear and moist. No oropharyngeal exudate.  Eyes: Conjunctivae and EOM are normal. Pupils are equal, round, and reactive to  light. Right eye exhibits no discharge. Left eye exhibits no discharge. No scleral icterus.  Neck: Normal range of motion. Neck supple. No tracheal deviation present. No thyromegaly present.  Cardiovascular: Normal rate and regular rhythm.  Exam reveals no gallop and no friction rub.   Murmur (systolic RSB) heard. Pulmonary/Chest: Effort normal and breath sounds normal. No accessory muscle usage. Not tachypneic. No respiratory distress. He has no decreased breath sounds. He has no wheezes. He has no rhonchi. He has no rales. He exhibits no tenderness.  Abdominal:    Musculoskeletal: Normal range of motion. He exhibits no edema.  Lymphadenopathy:    He has no cervical adenopathy.  Neurological: He is alert and oriented to person, place, and time. No cranial nerve deficit. Coordination normal.  Skin: Skin is warm and dry. No rash noted. He is not diaphoretic. No erythema. No pallor.  Psychiatric: He has a normal mood and affect. His behavior is normal. Judgment and thought content normal.          Assessment & Plan:   Problem List Items Addressed This Visit   Aortic stenosis - Primary (Chronic)     Progressive symptoms of shortness of breath. Currently undergoing evaluation for AVR. Awaiting GI clearance. Continue metoprolol, amlodipine.    Relevant Medications      metoprolol tartrate (LOPRESSOR) tablet   Chronic kidney disease, stage 5, kidney failure (Chronic)     Continues on HD. Tolerating well. Mentioned today that he would like to be considered for renal transplant after AVR.     GI (gastrointestinal bleed)     Recent hospitalization for GI bleeding. Followed by Dr. Paulita Fujita. Plan for repeat EGD  4/9. Continue Pantoprazole.    Relevant Orders      CBC w/Diff       Return in about 3 months (around 01-Feb-2014) for Recheck.

## 2013-10-22 NOTE — Progress Notes (Signed)
Pre visit review using our clinic review tool, if applicable. No additional management support is needed unless otherwise documented below in the visit note. 

## 2013-10-22 NOTE — Assessment & Plan Note (Addendum)
Recent hospitalization for GI bleeding. Followed by Dr. Paulita Fujita. Plan for repeat EGD 4/9. Continue Pantoprazole.

## 2013-10-22 NOTE — Assessment & Plan Note (Addendum)
Continues on HD. Tolerating well. Mentioned today that he would like to be considered for renal transplant after AVR.

## 2013-10-26 ENCOUNTER — Other Ambulatory Visit: Payer: Self-pay | Admitting: Gastroenterology

## 2013-10-26 NOTE — Addendum Note (Signed)
Addended by: Arta Silence on: 10/26/2013 10:36 AM   Modules accepted: Orders

## 2013-10-27 ENCOUNTER — Encounter: Payer: Self-pay | Admitting: *Deleted

## 2013-10-29 ENCOUNTER — Ambulatory Visit (HOSPITAL_COMMUNITY)
Admission: RE | Admit: 2013-10-29 | Discharge: 2013-10-29 | Disposition: A | Discharge status: Home / Self Care | Payer: Medicare Other | Source: Ambulatory Visit | Attending: Gastroenterology | Admitting: Gastroenterology

## 2013-10-29 ENCOUNTER — Encounter (HOSPITAL_COMMUNITY): Payer: Medicare Other | Admitting: Anesthesiology

## 2013-10-29 ENCOUNTER — Ambulatory Visit (HOSPITAL_COMMUNITY): Payer: Medicare Other | Admitting: Anesthesiology

## 2013-10-29 ENCOUNTER — Encounter (HOSPITAL_COMMUNITY)
Admission: RE | Disposition: A | Discharge status: Home / Self Care | Payer: Self-pay | Source: Ambulatory Visit | Attending: Gastroenterology

## 2013-10-29 ENCOUNTER — Encounter (HOSPITAL_COMMUNITY): Payer: Self-pay | Admitting: *Deleted

## 2013-10-29 DIAGNOSIS — K2940 Chronic atrophic gastritis without bleeding: Secondary | ICD-10-CM | POA: Insufficient documentation

## 2013-10-29 DIAGNOSIS — Z79899 Other long term (current) drug therapy: Secondary | ICD-10-CM | POA: Insufficient documentation

## 2013-10-29 DIAGNOSIS — K209 Esophagitis, unspecified without bleeding: Secondary | ICD-10-CM | POA: Insufficient documentation

## 2013-10-29 DIAGNOSIS — I2510 Atherosclerotic heart disease of native coronary artery without angina pectoris: Secondary | ICD-10-CM | POA: Insufficient documentation

## 2013-10-29 DIAGNOSIS — K219 Gastro-esophageal reflux disease without esophagitis: Secondary | ICD-10-CM | POA: Insufficient documentation

## 2013-10-29 DIAGNOSIS — Z87891 Personal history of nicotine dependence: Secondary | ICD-10-CM | POA: Insufficient documentation

## 2013-10-29 DIAGNOSIS — I359 Nonrheumatic aortic valve disorder, unspecified: Secondary | ICD-10-CM | POA: Insufficient documentation

## 2013-10-29 DIAGNOSIS — K921 Melena: Secondary | ICD-10-CM | POA: Insufficient documentation

## 2013-10-29 DIAGNOSIS — Z933 Colostomy status: Secondary | ICD-10-CM | POA: Insufficient documentation

## 2013-10-29 DIAGNOSIS — N186 End stage renal disease: Secondary | ICD-10-CM | POA: Insufficient documentation

## 2013-10-29 DIAGNOSIS — Z992 Dependence on renal dialysis: Secondary | ICD-10-CM | POA: Insufficient documentation

## 2013-10-29 DIAGNOSIS — D649 Anemia, unspecified: Secondary | ICD-10-CM | POA: Insufficient documentation

## 2013-10-29 DIAGNOSIS — I739 Peripheral vascular disease, unspecified: Secondary | ICD-10-CM | POA: Insufficient documentation

## 2013-10-29 DIAGNOSIS — I120 Hypertensive chronic kidney disease with stage 5 chronic kidney disease or end stage renal disease: Secondary | ICD-10-CM | POA: Insufficient documentation

## 2013-10-29 DIAGNOSIS — Z85118 Personal history of other malignant neoplasm of bronchus and lung: Secondary | ICD-10-CM | POA: Insufficient documentation

## 2013-10-29 DIAGNOSIS — I252 Old myocardial infarction: Secondary | ICD-10-CM | POA: Insufficient documentation

## 2013-10-29 HISTORY — PX: ESOPHAGOGASTRODUODENOSCOPY: SHX5428

## 2013-10-29 LAB — POCT I-STAT 4, (NA,K, GLUC, HGB,HCT)
Glucose, Bld: 80 mg/dL (ref 70–99)
HEMATOCRIT: 34 % — AB (ref 39.0–52.0)
HEMOGLOBIN: 11.6 g/dL — AB (ref 13.0–17.0)
Potassium: 5.2 mEq/L (ref 3.7–5.3)
Sodium: 139 mEq/L (ref 137–147)

## 2013-10-29 SURGERY — EGD (ESOPHAGOGASTRODUODENOSCOPY)
Anesthesia: Monitor Anesthesia Care

## 2013-10-29 MED ORDER — ONDANSETRON HCL 4 MG/2ML IJ SOLN
INTRAMUSCULAR | Status: AC
  Filled 2013-10-29: qty 2

## 2013-10-29 MED ORDER — SODIUM CHLORIDE 0.9 % IV SOLN
INTRAVENOUS | Status: DC
  Administered 2013-10-29: 09:00:00 via INTRAVENOUS

## 2013-10-29 MED ORDER — PROMETHAZINE HCL 25 MG/ML IJ SOLN: 6.2500 mg | INTRAMUSCULAR | Status: DC | PRN

## 2013-10-29 MED ORDER — LACTATED RINGERS IV SOLN: INTRAVENOUS | Status: DC

## 2013-10-29 MED ORDER — PROPOFOL 10 MG/ML IV BOLUS
INTRAVENOUS | Status: DC | PRN
  Administered 2013-10-29: 40 mg via INTRAVENOUS

## 2013-10-29 MED ORDER — ONDANSETRON HCL 4 MG/2ML IJ SOLN
4.0000 mg | Freq: Once | INTRAMUSCULAR | Status: AC
  Administered 2013-10-29: 4 mg via INTRAVENOUS

## 2013-10-29 MED ORDER — KETAMINE HCL 10 MG/ML IJ SOLN
INTRAMUSCULAR | Status: DC | PRN
  Administered 2013-10-29: 10 mg via INTRAVENOUS
  Administered 2013-10-29: 25 mg via INTRAVENOUS

## 2013-10-29 MED ORDER — PROPOFOL 10 MG/ML IV BOLUS
INTRAVENOUS | Status: AC
  Filled 2013-10-29: qty 20

## 2013-10-29 MED ORDER — BUTAMBEN-TETRACAINE-BENZOCAINE 2-2-14 % EX AERO
INHALATION_SPRAY | CUTANEOUS | Status: DC | PRN
  Administered 2013-10-29: 2 via TOPICAL

## 2013-10-29 MED ORDER — PROPOFOL INFUSION 10 MG/ML OPTIME
INTRAVENOUS | Status: DC | PRN
  Administered 2013-10-29: 75 ug/kg/min via INTRAVENOUS

## 2013-10-29 NOTE — H&P (View-Only) (Signed)
Subjective:    Patient ID: Gerald Cooke, male    DOB: 29-Aug-1940, 73 y.o.   MRN: [id number]  HPI 74YO male presents for follow up after recent hospitalization.   Aortic stenosis - Currently under evaluation for AVR. No current shortness of breath or chest pain at rest. Some shortness of breath with exertion, such as walking a short distance. Awaiting GI clearance prior to potential surgery.  GI Bleeding - s/p recent hospitalization for upper GI bleeding. S/p cauterization. Plans for EGD 4/9 with Dr. Paulita Fujita. He reports that stool in colostomy has returned to brownish color. No black or grossly bloody stool.  Review of Systems  Constitutional: Positive for fatigue. Negative for fever, chills, activity change, appetite change and unexpected weight change.  Eyes: Negative for visual disturbance.  Respiratory: Positive for shortness of breath. Negative for cough.   Cardiovascular: Negative for chest pain, palpitations and leg swelling.  Gastrointestinal: Negative for nausea, vomiting, abdominal pain, blood in stool and abdominal distention.  Genitourinary: Negative for dysuria, urgency and difficulty urinating.  Musculoskeletal: Negative for arthralgias and gait problem.  Skin: Negative for color change and rash.  Hematological: Negative for adenopathy.  Psychiatric/Behavioral: Negative for sleep disturbance and dysphoric mood. The patient is not nervous/anxious.        Objective:    BP 124/98  Pulse 98  Temp(Src) 98.2 F (36.8 C) (Oral)  Wt 130 lb (58.968 kg)  SpO2 97% Physical Exam  Constitutional: He is oriented to person, place, and time. He appears well-developed and well-nourished. No distress.  HENT:  Head: Normocephalic and atraumatic.  Right Ear: External ear normal.  Left Ear: External ear normal.  Nose: Nose normal.  Mouth/Throat: Oropharynx is clear and moist. No oropharyngeal exudate.  Eyes: Conjunctivae and EOM are normal. Pupils are equal, round, and reactive to  light. Right eye exhibits no discharge. Left eye exhibits no discharge. No scleral icterus.  Neck: Normal range of motion. Neck supple. No tracheal deviation present. No thyromegaly present.  Cardiovascular: Normal rate and regular rhythm.  Exam reveals no gallop and no friction rub.   Murmur (systolic RSB) heard. Pulmonary/Chest: Effort normal and breath sounds normal. No accessory muscle usage. Not tachypneic. No respiratory distress. He has no decreased breath sounds. He has no wheezes. He has no rhonchi. He has no rales. He exhibits no tenderness.  Abdominal:    Musculoskeletal: Normal range of motion. He exhibits no edema.  Lymphadenopathy:    He has no cervical adenopathy.  Neurological: He is alert and oriented to person, place, and time. No cranial nerve deficit. Coordination normal.  Skin: Skin is warm and dry. No rash noted. He is not diaphoretic. No erythema. No pallor.  Psychiatric: He has a normal mood and affect. His behavior is normal. Judgment and thought content normal.          Assessment & Plan:   Problem List Items Addressed This Visit   Aortic stenosis - Primary (Chronic)     Progressive symptoms of shortness of breath. Currently undergoing evaluation for AVR. Awaiting GI clearance. Continue metoprolol, amlodipine.    Relevant Medications      metoprolol tartrate (LOPRESSOR) tablet   Chronic kidney disease, stage 5, kidney failure (Chronic)     Continues on HD. Tolerating well. Mentioned today that he would like to be considered for renal transplant after AVR.     GI (gastrointestinal bleed)     Recent hospitalization for GI bleeding. Followed by Dr. Paulita Fujita. Plan for repeat EGD  4/9. Continue Pantoprazole.    Relevant Orders      CBC w/Diff       Return in about 3 months (around 02-20-14) for Recheck.

## 2013-10-29 NOTE — Anesthesia Postprocedure Evaluation (Signed)
Anesthesia Post Note  Patient: Gerald Cooke  Procedure(s) Performed: Procedure(s) (LRB): ESOPHAGOGASTRODUODENOSCOPY (EGD) (N/A)  Anesthesia type: MAC  Patient location: PACU  Post pain: Pain level controlled  Post assessment: Post-op Vital signs reviewed  Last Vitals:  Filed Vitals:   10/29/13 1110  BP: 129/62  Pulse:   Temp:   Resp: 14    Post vital signs: Reviewed  Level of consciousness: sedated  Complications: No apparent anesthesia complications

## 2013-10-29 NOTE — Op Note (Signed)
[facility] [place], [address]   ENDOSCOPY PROCEDURE REPORT  PATIENT: Gerald Cooke, Gerald Cooke  MR#: [id number] BIRTHDATE: [birth year] , 72  yrs. old GENDER: Male ENDOSCOPIST: Arta Silence, MD REFERRED BY:  Ronette Deter, M.D. PROCEDURE DATE:  10/29/2013 PROCEDURE:  EGD, diagnostic ASA CLASS:     Class IV INDICATIONS:  melena (resolved). MEDICATIONS: MAC sedation, administered by CRNA TOPICAL ANESTHETIC: Cetacaine Spray  DESCRIPTION OF PROCEDURE: After the risks benefits and alternatives of the procedure were thoroughly explained, informed consent was obtained.  The diagnostic forward-viewing  gastroscope was introduced through the mouth and advanced to the second portion of the duodenum. Without limitations.  The instrument was slowly withdrawn as the mucosa was fully examined.     Findings:  Fibrotic, friable, inflamed 10 cm in very proximal esophagus, previously seen and a bit worse now, likely from prior radiation treatments.  There was also a vascular protuberance in the proximal esophagus without active bleeding.  In the cardia, there was friable prominent mucosa, unclear if represents hyperplastic change or whether it represents gastric varices. 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  Otherwise normal endoscopy to second portion of the duodenum.              The scope was then withdrawn from the patient and the procedure completed.  ENDOSCOPIC IMPRESSION:     As above.  Today's findings place patient at not insignificant risk of bleeding, if he is given antiplatelet agents.  The vascular protuberance especially would be at risk to bleed.  At the present time, since his bleeding has stopped, I felt the risk of inducing life-threatening bleeding by trying to clip/cauterize the vascular protuberance far outweigh the benefits.  RECOMMENDATIONS:     1.  Watch for potential complications of procedure. 2.  I would not pursue TAVR at the  present time, unless there is compelling urgency, given risks for inducing GI bleeding from antiplatelet therapy, as described above. 3.  Restart pantoprazole 40 mg po bid. 4.  This is a challenging case with many things to consider, both from cardiac and from GI tract standpoint.  I will need to discuss case directly with Dr. Gilford Rile.  Options include repeat endoscopy as well as endoscopic ultrasound (to see if area in cardia is gastric varices, though no obvious varices were seen on recent CT scans) after 6 weeks of BID PPI.  eSigned:  Arta Silence, MD 10/29/2013 10:28 AM   CC:  PATIENT NAME:  [name], [name] MR#: [id number]

## 2013-10-29 NOTE — Transfer of Care (Signed)
Immediate Anesthesia Transfer of Care Note  Patient: Gerald Cooke  Procedure(s) Performed: Procedure(s) (LRB): ESOPHAGOGASTRODUODENOSCOPY (EGD) (N/A)  Patient Location: PACU  Anesthesia Type: MAC  Level of Consciousness: sedated, patient cooperative and responds to stimulation  Airway & Oxygen Therapy: Patient Spontanous Breathing and Patient connected to face mask oxgen  Post-op Assessment: Report given to PACU RN and Post -op Vital signs reviewed and stable  Post vital signs: Reviewed and stable  Complications: No apparent anesthesia complications

## 2013-10-29 NOTE — Discharge Instructions (Signed)
Endoscopy °Care After °Please read the instructions outlined below and refer to this sheet in the next few weeks. These discharge instructions provide you with general information on caring for yourself after you leave the hospital. Your doctor may also give you specific instructions. While your treatment has been planned according to the most current medical practices available, unavoidable complications occasionally occur. If you have any problems or questions after discharge, please call Dr. Anderson Coppock ([facility]) at [phone]. ° °HOME CARE INSTRUCTIONS °Activity °· You may resume your regular activity but move at a slower pace for the next 24 hours.  °· Take frequent rest periods for the next 24 hours.  °· Walking will help expel (get rid of) the air and reduce the bloated feeling in your abdomen.  °· No driving for 24 hours (because of the anesthesia (medicine) used during the test).  °· You may shower.  °· Do not sign any important legal documents or operate any machinery for 24 hours (because of the anesthesia used during the test).  °Nutrition °· Drink plenty of fluids.  °· You may resume your normal diet.  °· Begin with a light meal and progress to your normal diet.  °· Avoid alcoholic beverages for 24 hours or as instructed by your caregiver.  °Medications °You may resume your normal medications unless your caregiver tells you otherwise. °What you can expect today °· You may experience abdominal discomfort such as a feeling of fullness or "gas" pains.  °· You may experience a sore throat for 2 to 3 days. This is normal. Gargling with salt water may help this.  °·  °SEEK IMMEDIATE MEDICAL CARE IF: °· You have excessive nausea (feeling sick to your stomach) and/or vomiting.  °· You have severe abdominal pain and distention (swelling).  °· You have trouble swallowing.  °· You have a temperature over 100° F (37.8° C).  °· You have rectal bleeding or vomiting of blood.  °Document Released:  02/21/2004 Document Revised: 03/21/2011 Document Reviewed: 09/03/2007 °Exit[employer]® Patient Information ©2012 [employer]. °

## 2013-10-29 NOTE — Anesthesia Preprocedure Evaluation (Addendum)
Anesthesia Evaluation  Patient identified by MRN, date of birth, ID band Patient awake    Reviewed: Allergy & Precautions, H&P , NPO status , Patient's Chart, lab work & pertinent test results, reviewed documented beta blocker date and time   Airway Mallampati: II TM Distance: >3 FB Neck ROM: full    Dental  (+) Partial Upper, Dental Advisory Given, Partial Lower   Pulmonary shortness of breath, pneumonia -, former smoker,  Hx of lung CA 2009 breath sounds clear to auscultation        Cardiovascular Exercise Tolerance: Poor hypertension, Pt. on medications and Pt. on home beta blockers + CAD, + Past MI, + Peripheral Vascular Disease and +CHF + dysrhythmias Supra Ventricular Tachycardia + Valvular Problems/Murmurs AS Rhythm:regular Rate:Normal + Systolic murmurs (Grade 3/6 SEM) Severe AS; EF 40-45%   Neuro/Psych negative neurological ROS  negative psych ROS   GI/Hepatic Neg liver ROS, PUD, GERD-  Medicated,  Endo/Other  negative endocrine ROS  Renal/GU ESRF and DialysisRenal disease  negative genitourinary   Musculoskeletal   Abdominal   Peds  Hematology  (+) anemia ,   Anesthesia Other Findings See surgeon's H&P   Reproductive/Obstetrics negative OB ROS                        Anesthesia Physical Anesthesia Plan  ASA: IV  Anesthesia Plan: MAC   Post-op Pain Management:    Induction: Intravenous  Airway Management Planned: Nasal Cannula  Additional Equipment:   Intra-op Plan:   Post-operative Plan:   Informed Consent: I have reviewed the patients History and Physical, chart, labs and discussed the procedure including the risks, benefits and alternatives for the proposed anesthesia with the patient or authorized representative who has indicated his/her understanding and acceptance.   Dental advisory given  Plan Discussed with: CRNA  Anesthesia Plan Comments:        Anesthesia  Quick Evaluation

## 2013-10-29 NOTE — Interval H&P Note (Signed)
History and Physical Interval Note:  10/29/2013 10:00 AM  Gerald Cooke  has presented today for surgery, with the diagnosis of malenia  The various methods of treatment have been discussed with the patient and family. After consideration of risks, benefits and other options for treatment, the patient has consented to  Procedure(s) with comments: ESOPHAGOGASTRODUODENOSCOPY (EGD) (N/A) - shannon Greggory Brandy as a surgical intervention .  The patient's history has been reviewed, patient examined, no change in status, stable for surgery.  I have reviewed the patient's chart and labs.  Questions were answered to the patient's satisfaction.     Arta Silence  Assessment:  1.  History of melena.  Plan:  1.  Endoscopy, at surgeon request, to ensure no high-risk bleeding lesion seen, given his likely need for intensive antiplatelet therapy for the next several months at his upcoming transaortic valve replacement. 2.  Risks (bleeding, infection, bowel perforation that could require surgery, sedation-related changes in cardiopulmonary systems), benefits (identification and possible treatment of source of symptoms, exclusion of certain causes of symptoms), and alternatives (watchful waiting, radiographic imaging studies, empiric medical treatment) of upper endoscopy (EGD) were explained to patient/family in detail and patient wishes to proceed.

## 2013-10-30 ENCOUNTER — Encounter (HOSPITAL_COMMUNITY): Payer: Self-pay | Admitting: Gastroenterology

## 2013-11-02 ENCOUNTER — Ambulatory Visit: Payer: Medicare Other | Admitting: Thoracic Surgery (Cardiothoracic Vascular Surgery)

## 2013-11-04 ENCOUNTER — Ambulatory Visit: Payer: Self-pay | Admitting: Oncology

## 2013-11-14 ENCOUNTER — Other Ambulatory Visit: Payer: Self-pay | Admitting: Internal Medicine

## 2013-11-16 NOTE — Telephone Encounter (Signed)
Ok to refill Zofran?

## 2013-11-18 ENCOUNTER — Other Ambulatory Visit: Payer: Self-pay | Admitting: Cardiology

## 2013-11-20 ENCOUNTER — Ambulatory Visit: Payer: Self-pay | Admitting: Oncology

## 2013-11-21 ENCOUNTER — Other Ambulatory Visit: Payer: Self-pay | Admitting: Cardiology

## 2013-11-25 NOTE — Telephone Encounter (Signed)
Rx was sent to pharmacy electronically. 

## 2013-11-29 LAB — CBC WITH DIFFERENTIAL/PLATELET
BASOS PCT: 1.2 %
Basophil #: 0.1 10*3/uL (ref 0.0–0.1)
Eosinophil #: 0.2 10*3/uL (ref 0.0–0.7)
Eosinophil %: 2.3 %
HCT: 32.2 % — ABNORMAL LOW (ref 40.0–52.0)
HGB: 10 g/dL — AB (ref 13.0–18.0)
Lymphocyte #: 0.4 10*3/uL — ABNORMAL LOW (ref 1.0–3.6)
Lymphocyte %: 3.7 %
MCH: 28.2 pg (ref 26.0–34.0)
MCHC: 31 g/dL — AB (ref 32.0–36.0)
MCV: 91 fL (ref 80–100)
MONOS PCT: 9.9 %
Monocyte #: 1 x10 3/mm (ref 0.2–1.0)
Neutrophil #: 8.2 10*3/uL — ABNORMAL HIGH (ref 1.4–6.5)
Neutrophil %: 82.9 %
PLATELETS: 266 10*3/uL (ref 150–440)
RBC: 3.54 10*6/uL — AB (ref 4.40–5.90)
RDW: 16.3 % — AB (ref 11.5–14.5)
WBC: 9.9 10*3/uL (ref 3.8–10.6)

## 2013-11-29 LAB — COMPREHENSIVE METABOLIC PANEL
ALBUMIN: 2.5 g/dL — AB (ref 3.4–5.0)
ALT: 15 U/L (ref 12–78)
ANION GAP: 8 (ref 7–16)
AST: 18 U/L (ref 15–37)
Alkaline Phosphatase: 65 U/L
BUN: 57 mg/dL — ABNORMAL HIGH (ref 7–18)
Bilirubin,Total: 0.4 mg/dL (ref 0.2–1.0)
CALCIUM: 9.4 mg/dL (ref 8.5–10.1)
CHLORIDE: 101 mmol/L (ref 98–107)
CREATININE: 8.35 mg/dL — AB (ref 0.60–1.30)
Co2: 26 mmol/L (ref 21–32)
EGFR (Non-African Amer.): 6 — ABNORMAL LOW
GFR CALC AF AMER: 7 — AB
Glucose: 86 mg/dL (ref 65–99)
OSMOLALITY: 285 (ref 275–301)
Potassium: 4.8 mmol/L (ref 3.5–5.1)
Sodium: 135 mmol/L — ABNORMAL LOW (ref 136–145)
Total Protein: 6.9 g/dL (ref 6.4–8.2)

## 2013-11-29 LAB — LIPASE, BLOOD: LIPASE: 245 U/L (ref 73–393)

## 2013-11-29 LAB — TROPONIN I: Troponin-I: 1.4 ng/mL — ABNORMAL HIGH

## 2013-11-30 ENCOUNTER — Inpatient Hospital Stay: Payer: Self-pay | Admitting: Internal Medicine

## 2013-11-30 DIAGNOSIS — N186 End stage renal disease: Secondary | ICD-10-CM

## 2013-11-30 DIAGNOSIS — I359 Nonrheumatic aortic valve disorder, unspecified: Secondary | ICD-10-CM

## 2013-11-30 DIAGNOSIS — Z5189 Encounter for other specified aftercare: Secondary | ICD-10-CM

## 2013-11-30 LAB — URINALYSIS, COMPLETE
Bacteria: NONE SEEN
Bilirubin,UR: NEGATIVE
Blood: NEGATIVE
Ketone: NEGATIVE
LEUKOCYTE ESTERASE: NEGATIVE
Nitrite: NEGATIVE
PH: 8 (ref 4.5–8.0)
RBC,UR: <1 /HPF (ref 0–5)
Specific Gravity: 1.011 (ref 1.003–1.030)
WBC UR: <1 /HPF (ref 0–5)

## 2013-11-30 LAB — CK-MB
CK-MB: 2.5 ng/mL (ref 0.5–3.6)
CK-MB: 2.5 ng/mL (ref 0.5–3.6)
CK-MB: 2.7 ng/mL (ref 0.5–3.6)

## 2013-11-30 LAB — TROPONIN I
TROPONIN-I: 1.4 ng/mL — AB
TROPONIN-I: 1.2 ng/mL — AB

## 2013-11-30 LAB — PHOSPHORUS: PHOSPHORUS: 5.1 mg/dL — AB (ref 2.5–4.9)

## 2013-12-01 LAB — CBC WITH DIFFERENTIAL/PLATELET
BASOS ABS: 0.1 10*3/uL (ref 0.0–0.1)
Basophil %: 1 %
Eosinophil #: 0.2 10*3/uL (ref 0.0–0.7)
Eosinophil %: 2.8 %
HCT: 28.8 % — AB (ref 40.0–52.0)
HGB: 9.2 g/dL — ABNORMAL LOW (ref 13.0–18.0)
Lymphocyte #: 0.3 10*3/uL — ABNORMAL LOW (ref 1.0–3.6)
Lymphocyte %: 3.8 %
MCH: 28.7 pg (ref 26.0–34.0)
MCHC: 32.1 g/dL (ref 32.0–36.0)
MCV: 89 fL (ref 80–100)
MONO ABS: 1 x10 3/mm (ref 0.2–1.0)
Monocyte %: 11.3 %
NEUTROS ABS: 7.2 10*3/uL — AB (ref 1.4–6.5)
NEUTROS PCT: 81.1 %
Platelet: 202 10*3/uL (ref 150–440)
RBC: 3.22 10*6/uL — ABNORMAL LOW (ref 4.40–5.90)
RDW: 16.3 % — ABNORMAL HIGH (ref 11.5–14.5)
WBC: 8.9 10*3/uL (ref 3.8–10.6)

## 2013-12-01 LAB — BODY FLUID CELL COUNT WITH DIFFERENTIAL
BASOS ABS: 0 %
Eosinophil: 0 %
LYMPHS PCT: 26 %
NEUTROS PCT: 35 %
Nucleated Cell Count: 1315 /mm3
Other Cells BF: 0 %
Other Mononuclear Cells: 39 %

## 2013-12-01 LAB — PROTEIN, BODY FLUID: PROTEIN, BODY FLUID: 3.5 g/dL

## 2013-12-01 LAB — COMPREHENSIVE METABOLIC PANEL
ALT: 10 U/L — AB (ref 12–78)
AST: 15 U/L (ref 15–37)
Albumin: 2 g/dL — ABNORMAL LOW (ref 3.4–5.0)
Alkaline Phosphatase: 53 U/L
Anion Gap: 5 — ABNORMAL LOW (ref 7–16)
BILIRUBIN TOTAL: 0.3 mg/dL (ref 0.2–1.0)
BUN: 20 mg/dL — ABNORMAL HIGH (ref 7–18)
CALCIUM: 8.5 mg/dL (ref 8.5–10.1)
Chloride: 100 mmol/L (ref 98–107)
Co2: 31 mmol/L (ref 21–32)
Creatinine: 4.06 mg/dL — ABNORMAL HIGH (ref 0.60–1.30)
EGFR (African American): 16 — ABNORMAL LOW
EGFR (Non-African Amer.): 14 — ABNORMAL LOW
GLUCOSE: 89 mg/dL (ref 65–99)
Osmolality: 274 (ref 275–301)
Potassium: 4 mmol/L (ref 3.5–5.1)
Sodium: 136 mmol/L (ref 136–145)
Total Protein: 5.8 g/dL — ABNORMAL LOW (ref 6.4–8.2)

## 2013-12-01 LAB — MAGNESIUM: MAGNESIUM: 1.8 mg/dL

## 2013-12-02 LAB — PHOSPHORUS: PHOSPHORUS: 5.2 mg/dL — AB (ref 2.5–4.9)

## 2013-12-07 ENCOUNTER — Ambulatory Visit: Payer: Self-pay | Admitting: Oncology

## 2013-12-08 LAB — COMPREHENSIVE METABOLIC PANEL
Albumin: 2.7 g/dL — ABNORMAL LOW (ref 3.4–5.0)
Alkaline Phosphatase: 65 U/L
Anion Gap: 8 (ref 7–16)
BUN: 33 mg/dL — ABNORMAL HIGH (ref 7–18)
Bilirubin,Total: 0.3 mg/dL (ref 0.2–1.0)
Calcium, Total: 9.4 mg/dL (ref 8.5–10.1)
Chloride: 102 mmol/L (ref 98–107)
Co2: 32 mmol/L (ref 21–32)
Creatinine: 5.4 mg/dL — ABNORMAL HIGH (ref 0.60–1.30)
EGFR (African American): 11 — ABNORMAL LOW
EGFR (Non-African Amer.): 10 — ABNORMAL LOW
Glucose: 98 mg/dL (ref 65–99)
Osmolality: 290 (ref 275–301)
Potassium: 4.4 mmol/L (ref 3.5–5.1)
SGOT(AST): 12 U/L — ABNORMAL LOW (ref 15–37)
SGPT (ALT): 8 U/L — ABNORMAL LOW (ref 12–78)
Sodium: 142 mmol/L (ref 136–145)
Total Protein: 7.4 g/dL (ref 6.4–8.2)

## 2013-12-08 LAB — CBC CANCER CENTER
Basophil #: 0.1 x10 3/mm (ref 0.0–0.1)
Basophil %: 0.9 %
Eosinophil #: 0.2 x10 3/mm (ref 0.0–0.7)
Eosinophil %: 1.9 %
HCT: 30.7 % — ABNORMAL LOW (ref 40.0–52.0)
HGB: 9.8 g/dL — ABNORMAL LOW (ref 13.0–18.0)
Lymphocyte #: 0.5 x10 3/mm — ABNORMAL LOW (ref 1.0–3.6)
Lymphocyte %: 4.9 %
MCH: 28.3 pg (ref 26.0–34.0)
MCHC: 32.1 g/dL (ref 32.0–36.0)
MCV: 88 fL (ref 80–100)
Monocyte #: 0.9 x10 3/mm (ref 0.2–1.0)
Monocyte %: 8.8 %
Neutrophil #: 8.1 x10 3/mm — ABNORMAL HIGH (ref 1.4–6.5)
Neutrophil %: 83.5 %
Platelet: 223 x10 3/mm (ref 150–440)
RBC: 3.48 10*6/uL — ABNORMAL LOW (ref 4.40–5.90)
RDW: 16.1 % — ABNORMAL HIGH (ref 11.5–14.5)
WBC: 9.7 x10 3/mm (ref 3.8–10.6)

## 2013-12-08 LAB — PROTEIN, URINE, RANDOM: Protein, Random Urine: 209 mg/dL — ABNORMAL HIGH (ref 0–12)

## 2013-12-16 ENCOUNTER — Ambulatory Visit: Payer: Self-pay | Admitting: Vascular Surgery

## 2013-12-17 ENCOUNTER — Inpatient Hospital Stay: Payer: Self-pay | Admitting: Internal Medicine

## 2013-12-17 LAB — COMPREHENSIVE METABOLIC PANEL
AST: 20 U/L (ref 15–37)
Albumin: 2.4 g/dL — ABNORMAL LOW (ref 3.4–5.0)
Alkaline Phosphatase: 61 U/L
Anion Gap: 11 (ref 7–16)
BILIRUBIN TOTAL: 0.3 mg/dL (ref 0.2–1.0)
BUN: 67 mg/dL — ABNORMAL HIGH (ref 7–18)
CHLORIDE: 102 mmol/L (ref 98–107)
CREATININE: 5.44 mg/dL — AB (ref 0.60–1.30)
Calcium, Total: 8.2 mg/dL — ABNORMAL LOW (ref 8.5–10.1)
Co2: 28 mmol/L (ref 21–32)
EGFR (African American): 11 — ABNORMAL LOW
EGFR (Non-African Amer.): 10 — ABNORMAL LOW
GLUCOSE: 104 mg/dL — AB (ref 65–99)
Osmolality: 301 (ref 275–301)
Potassium: 4.6 mmol/L (ref 3.5–5.1)
SGPT (ALT): 11 U/L — ABNORMAL LOW (ref 12–78)
SODIUM: 141 mmol/L (ref 136–145)
TOTAL PROTEIN: 6.2 g/dL — AB (ref 6.4–8.2)

## 2013-12-17 LAB — CBC CANCER CENTER
Basophil #: 0.1 x10 3/mm (ref 0.0–0.1)
Basophil %: 0.5 %
Eosinophil #: 0.1 x10 3/mm (ref 0.0–0.7)
Eosinophil %: 0.5 %
HCT: 21.4 % — ABNORMAL LOW (ref 40.0–52.0)
HGB: 6.9 g/dL — ABNORMAL LOW (ref 13.0–18.0)
LYMPHS ABS: 0.5 x10 3/mm — AB (ref 1.0–3.6)
LYMPHS PCT: 3.5 %
MCH: 28.8 pg (ref 26.0–34.0)
MCHC: 32 g/dL (ref 32.0–36.0)
MCV: 90 fL (ref 80–100)
MONOS PCT: 8.8 %
Monocyte #: 1.3 x10 3/mm — ABNORMAL HIGH (ref 0.2–1.0)
Neutrophil #: 12.8 x10 3/mm — ABNORMAL HIGH (ref 1.4–6.5)
Neutrophil %: 86.7 %
Platelet: 243 x10 3/mm (ref 150–440)
RBC: 2.38 10*6/uL — ABNORMAL LOW (ref 4.40–5.90)
RDW: 17.2 % — AB (ref 11.5–14.5)
WBC: 14.8 x10 3/mm — ABNORMAL HIGH (ref 3.8–10.6)

## 2013-12-18 LAB — PROTIME-INR
INR: 1.2
Prothrombin Time: 14.8 secs — ABNORMAL HIGH (ref 11.5–14.7)

## 2013-12-18 LAB — CBC WITH DIFFERENTIAL/PLATELET
BASOS ABS: 0 10*3/uL (ref 0.0–0.1)
Basophil %: 0.3 %
EOS ABS: 0.2 10*3/uL (ref 0.0–0.7)
EOS PCT: 1.8 %
HCT: 22.9 % — ABNORMAL LOW (ref 40.0–52.0)
HGB: 7.4 g/dL — AB (ref 13.0–18.0)
Lymphocyte #: 0.5 10*3/uL — ABNORMAL LOW (ref 1.0–3.6)
Lymphocyte %: 4.9 %
MCH: 28.7 pg (ref 26.0–34.0)
MCHC: 32.4 g/dL (ref 32.0–36.0)
MCV: 89 fL (ref 80–100)
MONOS PCT: 9.3 %
Monocyte #: 1 x10 3/mm (ref 0.2–1.0)
NEUTROS PCT: 83.7 %
Neutrophil #: 8.7 10*3/uL — ABNORMAL HIGH (ref 1.4–6.5)
PLATELETS: 167 10*3/uL (ref 150–440)
RBC: 2.58 10*6/uL — AB (ref 4.40–5.90)
RDW: 17.3 % — ABNORMAL HIGH (ref 11.5–14.5)
WBC: 10.4 10*3/uL (ref 3.8–10.6)

## 2013-12-18 LAB — BASIC METABOLIC PANEL
ANION GAP: 8 (ref 7–16)
BUN: 81 mg/dL — ABNORMAL HIGH (ref 7–18)
CO2: 24 mmol/L (ref 21–32)
CREATININE: 6.28 mg/dL — AB (ref 0.60–1.30)
Calcium, Total: 8.3 mg/dL — ABNORMAL LOW (ref 8.5–10.1)
Chloride: 106 mmol/L (ref 98–107)
EGFR (African American): 9 — ABNORMAL LOW
EGFR (Non-African Amer.): 8 — ABNORMAL LOW
Glucose: 82 mg/dL (ref 65–99)
OSMOLALITY: 299 (ref 275–301)
Potassium: 4.3 mmol/L (ref 3.5–5.1)
SODIUM: 138 mmol/L (ref 136–145)

## 2013-12-18 LAB — MAGNESIUM: Magnesium: 1.8 mg/dL

## 2013-12-18 LAB — TSH: THYROID STIMULATING HORM: 6.28 u[IU]/mL — AB

## 2013-12-18 LAB — PHOSPHORUS: Phosphorus: 4.5 mg/dL (ref 2.5–4.9)

## 2013-12-18 LAB — APTT: Activated PTT: 47.1 secs — ABNORMAL HIGH (ref 23.6–35.9)

## 2013-12-21 ENCOUNTER — Ambulatory Visit: Payer: Self-pay | Admitting: Oncology

## 2013-12-21 ENCOUNTER — Telehealth: Payer: Self-pay | Admitting: Internal Medicine

## 2013-12-21 LAB — RENAL FUNCTION PANEL
ALBUMIN: 1.9 g/dL — AB (ref 3.4–5.0)
Anion Gap: 8 (ref 7–16)
BUN: 56 mg/dL — ABNORMAL HIGH (ref 7–18)
CALCIUM: 8.4 mg/dL — AB (ref 8.5–10.1)
CHLORIDE: 106 mmol/L (ref 98–107)
CO2: 26 mmol/L (ref 21–32)
Creatinine: 6.28 mg/dL — ABNORMAL HIGH (ref 0.60–1.30)
EGFR (Non-African Amer.): 8 — ABNORMAL LOW
GFR CALC AF AMER: 9 — AB
Glucose: 120 mg/dL — ABNORMAL HIGH (ref 65–99)
OSMOLALITY: 296 (ref 275–301)
POTASSIUM: 4.2 mmol/L (ref 3.5–5.1)
Phosphorus: 3.8 mg/dL (ref 2.5–4.9)
Sodium: 140 mmol/L (ref 136–145)

## 2013-12-21 LAB — CBC WITH DIFFERENTIAL/PLATELET
BASOS PCT: 1.1 %
Basophil #: 0.1 10*3/uL (ref 0.0–0.1)
EOS PCT: 1.6 %
Eosinophil #: 0.2 10*3/uL (ref 0.0–0.7)
HCT: 25.6 % — AB (ref 40.0–52.0)
HGB: 8.2 g/dL — AB (ref 13.0–18.0)
Lymphocyte #: 0.2 10*3/uL — ABNORMAL LOW (ref 1.0–3.6)
Lymphocyte %: 1.4 %
MCH: 29.3 pg (ref 26.0–34.0)
MCHC: 31.9 g/dL — AB (ref 32.0–36.0)
MCV: 92 fL (ref 80–100)
MONO ABS: 1 x10 3/mm (ref 0.2–1.0)
Monocyte %: 8.9 %
NEUTROS ABS: 9.5 10*3/uL — AB (ref 1.4–6.5)
NEUTROS PCT: 87 %
Platelet: 181 10*3/uL (ref 150–440)
RBC: 2.78 10*6/uL — ABNORMAL LOW (ref 4.40–5.90)
RDW: 18.9 % — AB (ref 11.5–14.5)
WBC: 10.9 10*3/uL — ABNORMAL HIGH (ref 3.8–10.6)

## 2013-12-21 NOTE — Telephone Encounter (Signed)
Fine to reschedule the appointment, or just cancel until he is out of the hospital.

## 2013-12-21 NOTE — Telephone Encounter (Signed)
Spoke with pt's daughter who stated that pt is being transferred to [facility] short term health care.  Advised her that we would cancel his appt and she could call back later to reschedule.

## 2013-12-21 NOTE — Telephone Encounter (Signed)
Patients daughter called after the step son called to let us know that he had gotten a reminder call from our phone tree. Patient is still in the hospital Gerald Cooke said that since the patient last saw you he has had 2 procedures done they are still giving patient chemo. She isn't sure if he will be able to keep this appointment for Thursday since he is still in the hospital. Please call her and let her know if we should just reschedule the appointment for Thursday.

## 2013-12-24 ENCOUNTER — Ambulatory Visit: Payer: Medicare Other | Admitting: Internal Medicine

## 2013-12-29 LAB — COMPREHENSIVE METABOLIC PANEL
ALK PHOS: 69 U/L
ALT: 12 U/L (ref 12–78)
Albumin: 2.1 g/dL — ABNORMAL LOW (ref 3.4–5.0)
Anion Gap: 8 (ref 7–16)
BUN: 36 mg/dL — ABNORMAL HIGH (ref 7–18)
Bilirubin,Total: 0.2 mg/dL (ref 0.2–1.0)
CALCIUM: 9 mg/dL (ref 8.5–10.1)
CHLORIDE: 99 mmol/L (ref 98–107)
Co2: 32 mmol/L (ref 21–32)
Creatinine: 4.77 mg/dL — ABNORMAL HIGH (ref 0.60–1.30)
EGFR (Non-African Amer.): 11 — ABNORMAL LOW
GFR CALC AF AMER: 13 — AB
GLUCOSE: 107 mg/dL — AB (ref 65–99)
Osmolality: 286 (ref 275–301)
POTASSIUM: 4.1 mmol/L (ref 3.5–5.1)
SGOT(AST): 31 U/L (ref 15–37)
Sodium: 139 mmol/L (ref 136–145)
Total Protein: 6.7 g/dL (ref 6.4–8.2)

## 2013-12-29 LAB — CBC CANCER CENTER
BASOS ABS: 0 x10 3/mm (ref 0.0–0.1)
BASOS PCT: 0.1 %
Eosinophil #: 0.1 x10 3/mm (ref 0.0–0.7)
Eosinophil %: 0.7 %
HCT: 27 % — ABNORMAL LOW (ref 40.0–52.0)
HGB: 8.5 g/dL — AB (ref 13.0–18.0)
Lymphocyte #: 0.2 x10 3/mm — ABNORMAL LOW (ref 1.0–3.6)
Lymphocyte %: 1.5 %
MCH: 28.9 pg (ref 26.0–34.0)
MCHC: 31.6 g/dL — AB (ref 32.0–36.0)
MCV: 92 fL (ref 80–100)
MONO ABS: 1 x10 3/mm (ref 0.2–1.0)
MONOS PCT: 8.7 %
NEUTROS ABS: 10.1 x10 3/mm — AB (ref 1.4–6.5)
Neutrophil %: 89 %
Platelet: 259 x10 3/mm (ref 150–440)
RBC: 2.95 10*6/uL — AB (ref 4.40–5.90)
RDW: 19.6 % — ABNORMAL HIGH (ref 11.5–14.5)
WBC: 11.3 x10 3/mm — ABNORMAL HIGH (ref 3.8–10.6)

## 2014-01-05 ENCOUNTER — Inpatient Hospital Stay: Payer: Self-pay | Admitting: Internal Medicine

## 2014-01-05 LAB — COMPREHENSIVE METABOLIC PANEL
AST: 19 U/L (ref 15–37)
Albumin: 2.1 g/dL — ABNORMAL LOW (ref 3.4–5.0)
Alkaline Phosphatase: 58 U/L
Anion Gap: 9 (ref 7–16)
BUN: 52 mg/dL — AB (ref 7–18)
Bilirubin,Total: 0.4 mg/dL (ref 0.2–1.0)
CHLORIDE: 103 mmol/L (ref 98–107)
Calcium, Total: 8.8 mg/dL (ref 8.5–10.1)
Co2: 28 mmol/L (ref 21–32)
Creatinine: 5.94 mg/dL — ABNORMAL HIGH (ref 0.60–1.30)
EGFR (African American): 10 — ABNORMAL LOW
EGFR (Non-African Amer.): 9 — ABNORMAL LOW
GLUCOSE: 98 mg/dL (ref 65–99)
Osmolality: 293 (ref 275–301)
Potassium: 4.2 mmol/L (ref 3.5–5.1)
SGPT (ALT): 7 U/L — ABNORMAL LOW (ref 12–78)
Sodium: 140 mmol/L (ref 136–145)
TOTAL PROTEIN: 6.4 g/dL (ref 6.4–8.2)

## 2014-01-05 LAB — TROPONIN I: Troponin-I: 0.97 ng/mL — ABNORMAL HIGH

## 2014-01-05 LAB — CBC
HCT: 22.8 % — ABNORMAL LOW (ref 40.0–52.0)
HGB: 7.1 g/dL — ABNORMAL LOW (ref 13.0–18.0)
MCH: 29.5 pg (ref 26.0–34.0)
MCHC: 31.2 g/dL — ABNORMAL LOW (ref 32.0–36.0)
MCV: 95 fL (ref 80–100)
Platelet: 161 10*3/uL (ref 150–440)
RBC: 2.4 10*6/uL — ABNORMAL LOW (ref 4.40–5.90)
RDW: 21.1 % — ABNORMAL HIGH (ref 11.5–14.5)
WBC: 11.9 10*3/uL — ABNORMAL HIGH (ref 3.8–10.6)

## 2014-01-05 LAB — CK TOTAL AND CKMB (NOT AT ARMC)
CK, Total: 15 U/L — ABNORMAL LOW
CK-MB: 3 ng/mL (ref 0.5–3.6)

## 2014-01-05 LAB — CK-MB: CK-MB: 2.6 ng/mL (ref 0.5–3.6)

## 2014-01-05 LAB — LIPASE, BLOOD: LIPASE: 87 U/L (ref 73–393)

## 2014-01-06 LAB — CBC WITH DIFFERENTIAL/PLATELET
BASOS ABS: 0 10*3/uL (ref 0.0–0.1)
Basophil %: 0.2 %
EOS ABS: 0.1 10*3/uL (ref 0.0–0.7)
Eosinophil %: 0.5 %
HCT: 26.1 % — AB (ref 40.0–52.0)
HGB: 8.5 g/dL — AB (ref 13.0–18.0)
LYMPHS PCT: 1.9 %
Lymphocyte #: 0.2 10*3/uL — ABNORMAL LOW (ref 1.0–3.6)
MCH: 30.5 pg (ref 26.0–34.0)
MCHC: 32.7 g/dL (ref 32.0–36.0)
MCV: 94 fL (ref 80–100)
MONOS PCT: 8.9 %
Monocyte #: 1 x10 3/mm (ref 0.2–1.0)
Neutrophil #: 9.8 10*3/uL — ABNORMAL HIGH (ref 1.4–6.5)
Neutrophil %: 88.5 %
PLATELETS: 136 10*3/uL — AB (ref 150–440)
RBC: 2.79 10*6/uL — ABNORMAL LOW (ref 4.40–5.90)
RDW: 17.8 % — ABNORMAL HIGH (ref 11.5–14.5)
WBC: 11.1 10*3/uL — ABNORMAL HIGH (ref 3.8–10.6)

## 2014-01-06 LAB — VANCOMYCIN, TROUGH: VANCOMYCIN, TROUGH: 6 ug/mL — AB (ref 10–20)

## 2014-01-06 LAB — CK-MB: CK-MB: 1.9 ng/mL (ref 0.5–3.6)

## 2014-01-06 LAB — HEMOGLOBIN
HGB: 7.5 g/dL — AB (ref 13.0–18.0)
HGB: 9 g/dL — AB (ref 13.0–18.0)
HGB: 6 g/dL — AB (ref 13.0–18.0)

## 2014-01-06 LAB — OCCULT BLOOD X 1 CARD TO LAB, STOOL: OCCULT BLOOD, FECES: POSITIVE

## 2014-01-06 LAB — PHOSPHORUS: PHOSPHORUS: 5.9 mg/dL — AB (ref 2.5–4.9)

## 2014-01-07 LAB — CBC WITH DIFFERENTIAL/PLATELET
BASOS PCT: 0.3 %
Basophil #: 0 10*3/uL (ref 0.0–0.1)
EOS ABS: 0.2 10*3/uL (ref 0.0–0.7)
Eosinophil %: 1.9 %
HCT: 28.2 % — ABNORMAL LOW (ref 40.0–52.0)
HGB: 9.1 g/dL — ABNORMAL LOW (ref 13.0–18.0)
LYMPHS ABS: 0.4 10*3/uL — AB (ref 1.0–3.6)
Lymphocyte %: 3.4 %
MCH: 30.3 pg (ref 26.0–34.0)
MCHC: 32.4 g/dL (ref 32.0–36.0)
MCV: 94 fL (ref 80–100)
MONO ABS: 1.4 x10 3/mm — AB (ref 0.2–1.0)
Monocyte %: 10.5 %
NEUTROS ABS: 10.9 10*3/uL — AB (ref 1.4–6.5)
Neutrophil %: 83.9 %
PLATELETS: 143 10*3/uL — AB (ref 150–440)
RBC: 3.02 10*6/uL — AB (ref 4.40–5.90)
RDW: 17.9 % — AB (ref 11.5–14.5)
WBC: 13 10*3/uL — AB (ref 3.8–10.6)

## 2014-01-07 LAB — BASIC METABOLIC PANEL
Anion Gap: 7 (ref 7–16)
BUN: 31 mg/dL — ABNORMAL HIGH (ref 7–18)
CHLORIDE: 100 mmol/L (ref 98–107)
Calcium, Total: 8.6 mg/dL (ref 8.5–10.1)
Co2: 31 mmol/L (ref 21–32)
Creatinine: 4.06 mg/dL — ABNORMAL HIGH (ref 0.60–1.30)
EGFR (African American): 16 — ABNORMAL LOW
EGFR (Non-African Amer.): 14 — ABNORMAL LOW
GLUCOSE: 86 mg/dL (ref 65–99)
Osmolality: 282 (ref 275–301)
Potassium: 4.2 mmol/L (ref 3.5–5.1)
SODIUM: 138 mmol/L (ref 136–145)

## 2014-01-08 LAB — URINALYSIS, COMPLETE
BILIRUBIN, UR: NEGATIVE
Bacteria: NONE SEEN
Blood: NEGATIVE
Glucose,UR: NEGATIVE mg/dL (ref 0–75)
KETONE: NEGATIVE
Leukocyte Esterase: NEGATIVE
Nitrite: NEGATIVE
PH: 7 (ref 4.5–8.0)
Specific Gravity: 1.014 (ref 1.003–1.030)
Squamous Epithelial: NONE SEEN
WBC UR: 2 /HPF (ref 0–5)

## 2014-01-08 LAB — CBC WITH DIFFERENTIAL/PLATELET
BASOS ABS: 0 10*3/uL (ref 0.0–0.1)
BASOS PCT: 0.3 %
EOS ABS: 0.1 10*3/uL (ref 0.0–0.7)
EOS PCT: 1.1 %
HCT: 27 % — ABNORMAL LOW (ref 40.0–52.0)
HGB: 8.7 g/dL — ABNORMAL LOW (ref 13.0–18.0)
LYMPHS PCT: 1.7 %
Lymphocyte #: 0.2 10*3/uL — ABNORMAL LOW (ref 1.0–3.6)
MCH: 30.5 pg (ref 26.0–34.0)
MCHC: 32.4 g/dL (ref 32.0–36.0)
MCV: 94 fL (ref 80–100)
MONOS PCT: 8.3 %
Monocyte #: 1 x10 3/mm (ref 0.2–1.0)
NEUTROS PCT: 88.6 %
Neutrophil #: 10.6 10*3/uL — ABNORMAL HIGH (ref 1.4–6.5)
Platelet: 139 10*3/uL — ABNORMAL LOW (ref 150–440)
RBC: 2.87 10*6/uL — ABNORMAL LOW (ref 4.40–5.90)
RDW: 18.3 % — AB (ref 11.5–14.5)
WBC: 12 10*3/uL — AB (ref 3.8–10.6)

## 2014-01-08 LAB — BASIC METABOLIC PANEL
ANION GAP: 8 (ref 7–16)
BUN: 42 mg/dL — AB (ref 7–18)
CALCIUM: 8.8 mg/dL (ref 8.5–10.1)
CREATININE: 5.12 mg/dL — AB (ref 0.60–1.30)
Chloride: 102 mmol/L (ref 98–107)
Co2: 28 mmol/L (ref 21–32)
EGFR (African American): 12 — ABNORMAL LOW
EGFR (Non-African Amer.): 10 — ABNORMAL LOW
Glucose: 75 mg/dL (ref 65–99)
OSMOLALITY: 285 (ref 275–301)
Potassium: 4.2 mmol/L (ref 3.5–5.1)
SODIUM: 138 mmol/L (ref 136–145)

## 2014-01-09 DIAGNOSIS — I359 Nonrheumatic aortic valve disorder, unspecified: Secondary | ICD-10-CM

## 2014-01-09 LAB — HEMOGLOBIN: HGB: 8.6 g/dL — ABNORMAL LOW (ref 13.0–18.0)

## 2014-01-10 LAB — CBC WITH DIFFERENTIAL/PLATELET
BASOS PCT: 0.4 %
Basophil #: 0 10*3/uL (ref 0.0–0.1)
EOS ABS: 0.2 10*3/uL (ref 0.0–0.7)
Eosinophil %: 1.9 %
HCT: 24.6 % — ABNORMAL LOW (ref 40.0–52.0)
HGB: 7.6 g/dL — ABNORMAL LOW (ref 13.0–18.0)
Lymphocyte #: 0.2 10*3/uL — ABNORMAL LOW (ref 1.0–3.6)
Lymphocyte %: 1.6 %
MCH: 29.4 pg (ref 26.0–34.0)
MCHC: 31 g/dL — AB (ref 32.0–36.0)
MCV: 95 fL (ref 80–100)
MONO ABS: 1 x10 3/mm (ref 0.2–1.0)
Monocyte %: 8.7 %
Neutrophil #: 10.2 10*3/uL — ABNORMAL HIGH (ref 1.4–6.5)
Neutrophil %: 87.4 %
Platelet: 141 10*3/uL — ABNORMAL LOW (ref 150–440)
RBC: 2.59 10*6/uL — ABNORMAL LOW (ref 4.40–5.90)
RDW: 18.7 % — AB (ref 11.5–14.5)
WBC: 11.7 10*3/uL — ABNORMAL HIGH (ref 3.8–10.6)

## 2014-01-10 LAB — BASIC METABOLIC PANEL
Anion Gap: 8 (ref 7–16)
BUN: 37 mg/dL — AB (ref 7–18)
CALCIUM: 9 mg/dL (ref 8.5–10.1)
CHLORIDE: 101 mmol/L (ref 98–107)
Co2: 30 mmol/L (ref 21–32)
Creatinine: 4.29 mg/dL — ABNORMAL HIGH (ref 0.60–1.30)
EGFR (Non-African Amer.): 13 — ABNORMAL LOW
GFR CALC AF AMER: 15 — AB
GLUCOSE: 91 mg/dL (ref 65–99)
Osmolality: 286 (ref 275–301)
Potassium: 4.1 mmol/L (ref 3.5–5.1)
Sodium: 139 mmol/L (ref 136–145)

## 2014-01-11 LAB — CBC WITH DIFFERENTIAL/PLATELET
BASOS PCT: 0.4 %
BASOS PCT: 0.4 %
Basophil #: 0.1 10*3/uL (ref 0.0–0.1)
Basophil #: 0.1 10*3/uL (ref 0.0–0.1)
EOS PCT: 1.6 %
Eosinophil #: 0.2 10*3/uL (ref 0.0–0.7)
Eosinophil #: 0.2 10*3/uL (ref 0.0–0.7)
Eosinophil %: 1.5 %
HCT: 27.9 % — AB (ref 40.0–52.0)
HCT: 30.9 % — ABNORMAL LOW (ref 40.0–52.0)
HGB: 9 g/dL — ABNORMAL LOW (ref 13.0–18.0)
HGB: 10 g/dL — ABNORMAL LOW (ref 13.0–18.0)
LYMPHS PCT: 2.2 %
Lymphocyte #: 0.2 10*3/uL — ABNORMAL LOW (ref 1.0–3.6)
Lymphocyte #: 0.3 10*3/uL — ABNORMAL LOW (ref 1.0–3.6)
Lymphocyte %: 1.7 %
MCH: 29.6 pg (ref 26.0–34.0)
MCH: 29.9 pg (ref 26.0–34.0)
MCHC: 32.1 g/dL (ref 32.0–36.0)
MCHC: 32.5 g/dL (ref 32.0–36.0)
MCV: 92 fL (ref 80–100)
MCV: 92 fL (ref 80–100)
MONO ABS: 1 x10 3/mm (ref 0.2–1.0)
MONO ABS: 1.1 x10 3/mm — AB (ref 0.2–1.0)
Monocyte %: 7.5 %
Monocyte %: 7.1 %
NEUTROS PCT: 88.8 %
Neutrophil #: 11.8 10*3/uL — ABNORMAL HIGH (ref 1.4–6.5)
Neutrophil #: 13.9 10*3/uL — ABNORMAL HIGH (ref 1.4–6.5)
Neutrophil %: 88.8 %
PLATELETS: 149 10*3/uL — AB (ref 150–440)
PLATELETS: 170 10*3/uL (ref 150–440)
RBC: 3.02 10*6/uL — AB (ref 4.40–5.90)
RBC: 3.35 10*6/uL — ABNORMAL LOW (ref 4.40–5.90)
RDW: 19.8 % — AB (ref 11.5–14.5)
RDW: 20.3 % — ABNORMAL HIGH (ref 11.5–14.5)
WBC: 13.3 10*3/uL — AB (ref 3.8–10.6)
WBC: 15.7 10*3/uL — ABNORMAL HIGH (ref 3.8–10.6)

## 2014-01-11 LAB — BASIC METABOLIC PANEL
Anion Gap: 8 (ref 7–16)
BUN: 49 mg/dL — AB (ref 7–18)
Calcium, Total: 9.1 mg/dL (ref 8.5–10.1)
Chloride: 102 mmol/L (ref 98–107)
Co2: 28 mmol/L (ref 21–32)
Creatinine: 5.09 mg/dL — ABNORMAL HIGH (ref 0.60–1.30)
EGFR (Non-African Amer.): 11 — ABNORMAL LOW
GFR CALC AF AMER: 12 — AB
Glucose: 88 mg/dL (ref 65–99)
Osmolality: 288 (ref 275–301)
Potassium: 4.4 mmol/L (ref 3.5–5.1)
Sodium: 138 mmol/L (ref 136–145)

## 2014-01-11 LAB — RENAL FUNCTION PANEL
Albumin: 1.8 g/dL — ABNORMAL LOW (ref 3.4–5.0)
Anion Gap: 7 (ref 7–16)
BUN: 49 mg/dL — ABNORMAL HIGH (ref 7–18)
Calcium, Total: 9.4 mg/dL (ref 8.5–10.1)
Chloride: 104 mmol/L (ref 98–107)
Co2: 28 mmol/L (ref 21–32)
Creatinine: 5.07 mg/dL — ABNORMAL HIGH (ref 0.60–1.30)
EGFR (African American): 12 — ABNORMAL LOW
EGFR (Non-African Amer.): 11 — ABNORMAL LOW
Glucose: 88 mg/dL (ref 65–99)
OSMOLALITY: 290 (ref 275–301)
PHOSPHORUS: 4.1 mg/dL (ref 2.5–4.9)
Potassium: 4.4 mmol/L (ref 3.5–5.1)
SODIUM: 139 mmol/L (ref 136–145)

## 2014-01-11 LAB — CULTURE, BLOOD (SINGLE)

## 2014-01-13 LAB — CBC WITH DIFFERENTIAL/PLATELET
BASOS ABS: 0.1 10*3/uL (ref 0.0–0.1)
Basophil %: 0.4 %
EOS PCT: 2 %
Eosinophil #: 0.3 10*3/uL (ref 0.0–0.7)
HCT: 26.2 % — AB (ref 40.0–52.0)
HGB: 8.5 g/dL — AB (ref 13.0–18.0)
LYMPHS ABS: 0.2 10*3/uL — AB (ref 1.0–3.6)
Lymphocyte %: 1.8 %
MCH: 30.1 pg (ref 26.0–34.0)
MCHC: 32.5 g/dL (ref 32.0–36.0)
MCV: 93 fL (ref 80–100)
Monocyte #: 1.2 x10 3/mm — ABNORMAL HIGH (ref 0.2–1.0)
Monocyte %: 8.9 %
NEUTROS ABS: 11.6 10*3/uL — AB (ref 1.4–6.5)
NEUTROS PCT: 86.9 %
PLATELETS: 168 10*3/uL (ref 150–440)
RBC: 2.83 10*6/uL — ABNORMAL LOW (ref 4.40–5.90)
RDW: 19.6 % — AB (ref 11.5–14.5)
WBC: 13.4 10*3/uL — ABNORMAL HIGH (ref 3.8–10.6)

## 2014-01-13 LAB — PHOSPHORUS: Phosphorus: 4.3 mg/dL (ref 2.5–4.9)

## 2014-01-15 LAB — CBC WITH DIFFERENTIAL/PLATELET
BASOS PCT: 1.4 %
Basophil #: 0.2 10*3/uL — ABNORMAL HIGH (ref 0.0–0.1)
Eosinophil #: 0.1 10*3/uL (ref 0.0–0.7)
Eosinophil %: 0.8 %
HCT: 27.8 % — ABNORMAL LOW (ref 40.0–52.0)
HGB: 8.9 g/dL — AB (ref 13.0–18.0)
LYMPHS PCT: 1.2 %
Lymphocyte #: 0.2 10*3/uL — ABNORMAL LOW (ref 1.0–3.6)
MCH: 29.9 pg (ref 26.0–34.0)
MCHC: 31.9 g/dL — ABNORMAL LOW (ref 32.0–36.0)
MCV: 94 fL (ref 80–100)
MONOS PCT: 8.8 %
Monocyte #: 1.4 x10 3/mm — ABNORMAL HIGH (ref 0.2–1.0)
NEUTROS PCT: 87.8 %
Neutrophil #: 13.7 10*3/uL — ABNORMAL HIGH (ref 1.4–6.5)
PLATELETS: 163 10*3/uL (ref 150–440)
RBC: 2.97 10*6/uL — ABNORMAL LOW (ref 4.40–5.90)
RDW: 19.5 % — AB (ref 11.5–14.5)
WBC: 15.6 10*3/uL — AB (ref 3.8–10.6)

## 2014-01-15 LAB — BASIC METABOLIC PANEL
ANION GAP: 5 — AB (ref 7–16)
BUN: 35 mg/dL — AB (ref 7–18)
CO2: 32 mmol/L (ref 21–32)
Calcium, Total: 9.1 mg/dL (ref 8.5–10.1)
Chloride: 103 mmol/L (ref 98–107)
Creatinine: 4.95 mg/dL — ABNORMAL HIGH (ref 0.60–1.30)
EGFR (African American): 13 — ABNORMAL LOW
EGFR (Non-African Amer.): 11 — ABNORMAL LOW
GLUCOSE: 89 mg/dL (ref 65–99)
Osmolality: 287 (ref 275–301)
Potassium: 4.4 mmol/L (ref 3.5–5.1)
Sodium: 140 mmol/L (ref 136–145)

## 2014-01-15 LAB — PHOSPHORUS: PHOSPHORUS: 4.4 mg/dL (ref 2.5–4.9)

## 2014-01-16 LAB — BASIC METABOLIC PANEL
Anion Gap: 5 — ABNORMAL LOW (ref 7–16)
BUN: 24 mg/dL — AB (ref 7–18)
Calcium, Total: 8.5 mg/dL (ref 8.5–10.1)
Chloride: 104 mmol/L (ref 98–107)
Co2: 32 mmol/L (ref 21–32)
Creatinine: 3.88 mg/dL — ABNORMAL HIGH (ref 0.60–1.30)
EGFR (African American): 17 — ABNORMAL LOW
GFR CALC NON AF AMER: 15 — AB
Glucose: 97 mg/dL (ref 65–99)
OSMOLALITY: 285 (ref 275–301)
POTASSIUM: 4.3 mmol/L (ref 3.5–5.1)
Sodium: 141 mmol/L (ref 136–145)

## 2014-01-16 LAB — CBC WITH DIFFERENTIAL/PLATELET
BASOS ABS: 0 10*3/uL (ref 0.0–0.1)
Basophil %: 0.3 %
EOS ABS: 0 10*3/uL (ref 0.0–0.7)
EOS PCT: 0.4 %
HCT: 24 % — ABNORMAL LOW (ref 40.0–52.0)
HGB: 7.4 g/dL — ABNORMAL LOW (ref 13.0–18.0)
LYMPHS ABS: 0.1 10*3/uL — AB (ref 1.0–3.6)
LYMPHS PCT: 0.7 %
MCH: 29 pg (ref 26.0–34.0)
MCHC: 30.8 g/dL — ABNORMAL LOW (ref 32.0–36.0)
MCV: 94 fL (ref 80–100)
MONOS PCT: 7.9 %
Monocyte #: 1 x10 3/mm (ref 0.2–1.0)
Neutrophil #: 11.8 10*3/uL — ABNORMAL HIGH (ref 1.4–6.5)
Neutrophil %: 90.7 %
Platelet: 162 10*3/uL (ref 150–440)
RBC: 2.55 10*6/uL — ABNORMAL LOW (ref 4.40–5.90)
RDW: 19.3 % — AB (ref 11.5–14.5)
WBC: 13 10*3/uL — ABNORMAL HIGH (ref 3.8–10.6)

## 2014-01-18 LAB — PHOSPHORUS: Phosphorus: 5 mg/dL — ABNORMAL HIGH (ref 2.5–4.9)

## 2014-01-20 ENCOUNTER — Ambulatory Visit: Payer: Self-pay | Admitting: Oncology

## 2014-01-20 ENCOUNTER — Ambulatory Visit
Admit: 2014-01-20 | Disposition: A | Discharge status: Home / Self Care | Payer: Self-pay | Attending: Nurse Practitioner | Admitting: Nurse Practitioner

## 2014-02-20 DEATH — deceased

## 2014-07-01 ENCOUNTER — Encounter (HOSPITAL_COMMUNITY): Payer: Self-pay | Admitting: Cardiovascular Disease

## 2014-11-06 IMAGING — CR DG CHEST 2V
2 series · 2 of 2 positions shown · non-contrast
Comparison: August 21, 2013

CLINICAL DATA: Shortness of Breath

EXAM:
CHEST  2 VIEW

[w chest pa]
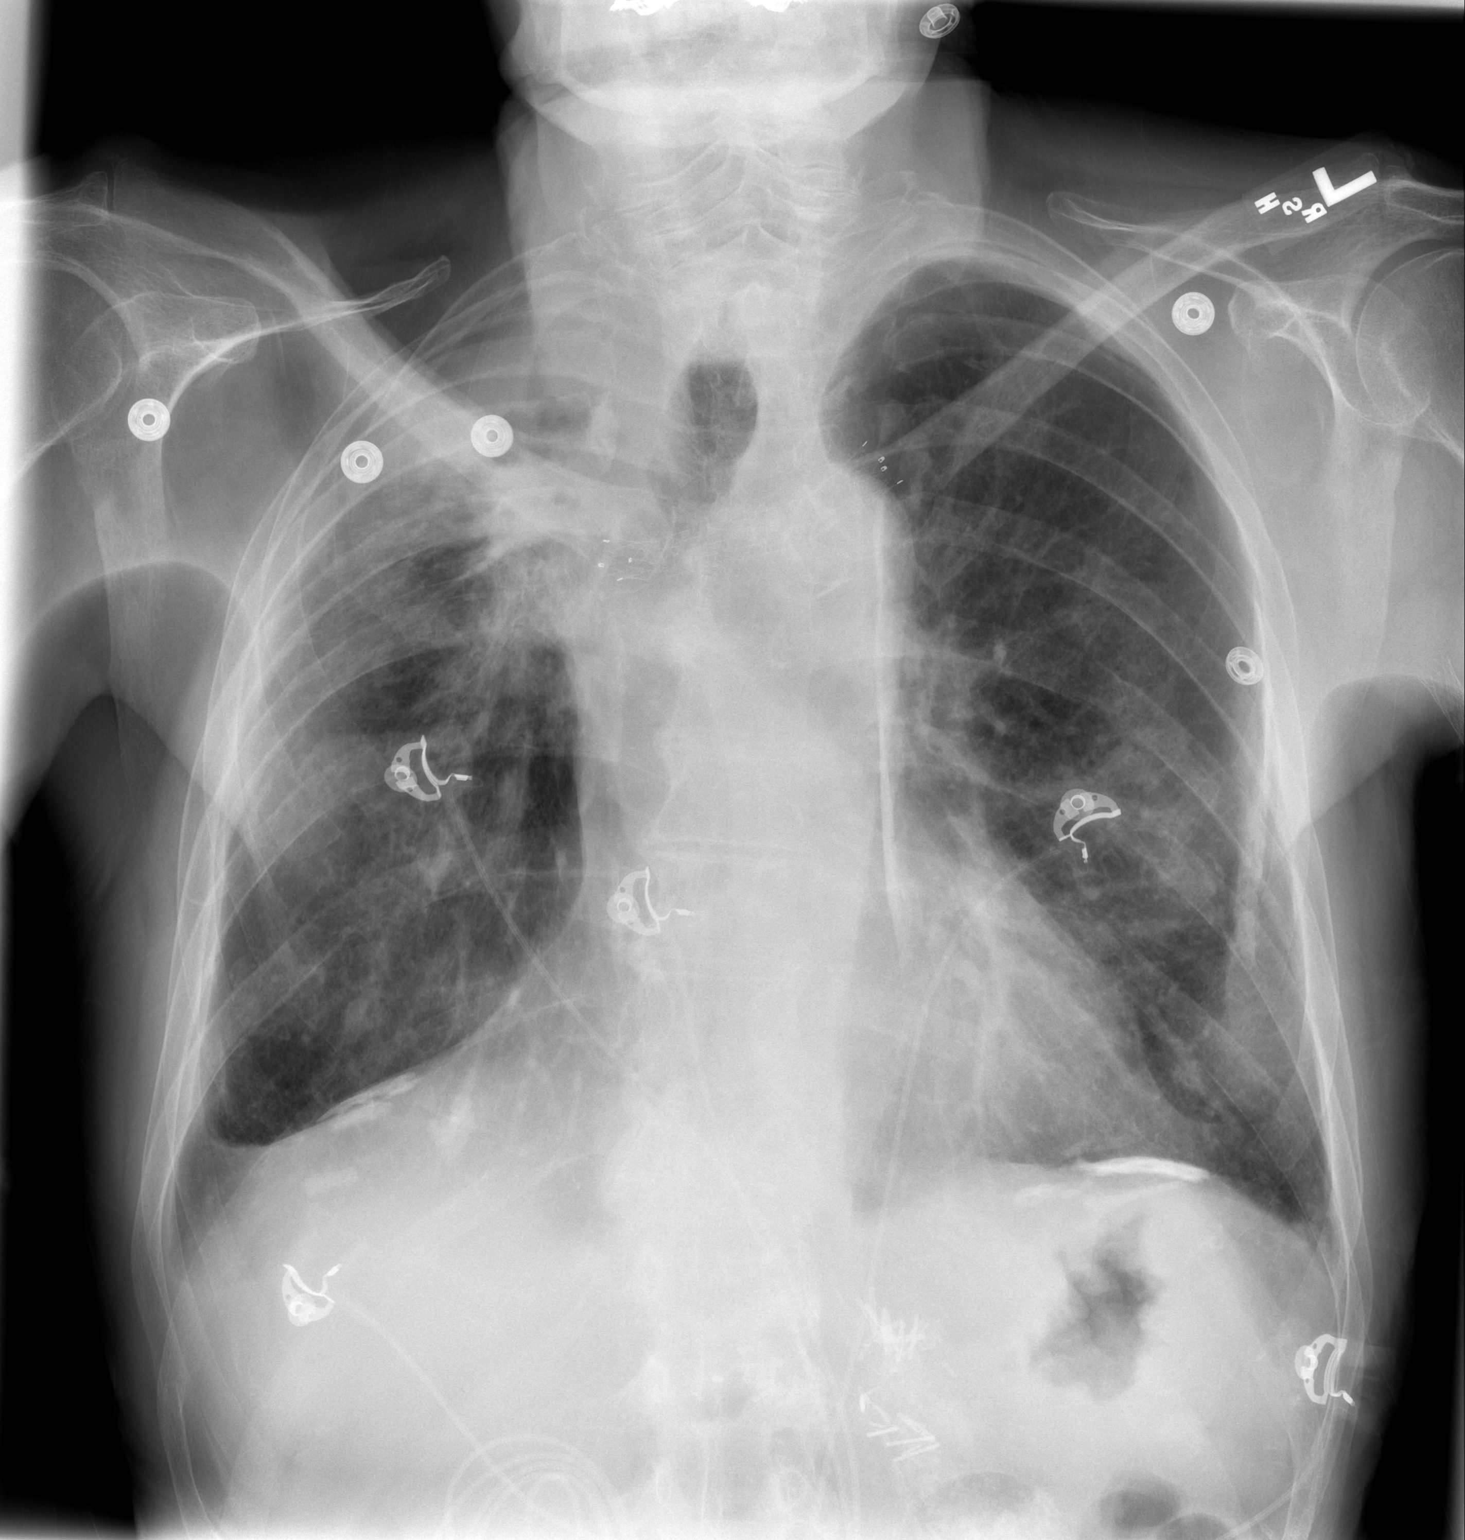

[w chest lat]
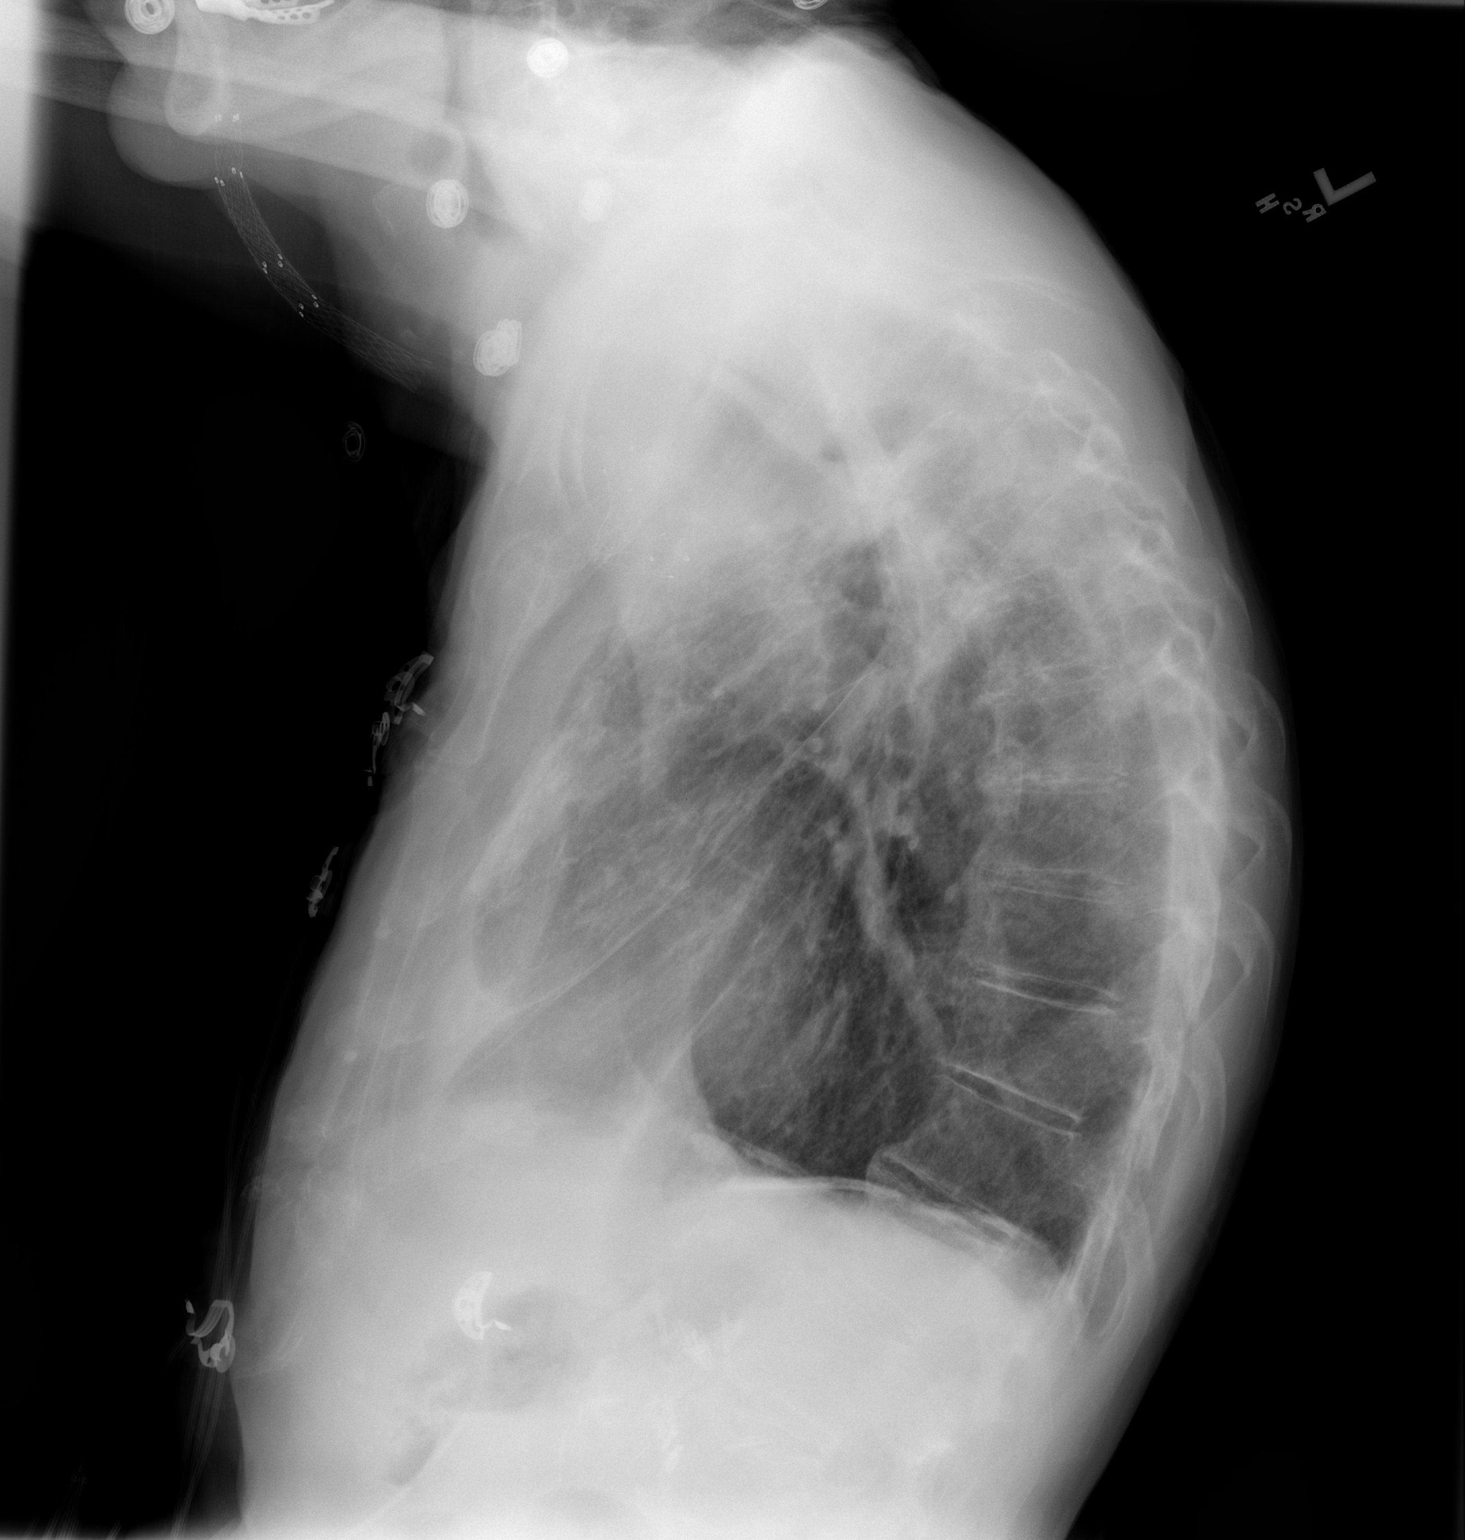

[2 of 2 positions shown; findings below may reference images not displayed]

FINDINGS: There is persistent volume loss in the right upper lobe with a
cavitary lesion which appear stable. There is mild scarring in the
bases. There is calcification along both hemidiaphragms as well as
along several pleural surfaces consistent with previous asbestos
exposure. There is some localized pleural thickening in the left mid
hemi thorax, stable. There is no new opacity. There is no edema or
pulmonary venous hypertension. No cardiomegaly. There is a stent in
the innominate vein regions.
IMPRESSION: No appreciable change from the most recent prior study. Cavitary
lesion with a cicatrization in the right upper lobe, stable.
Evidence of previous asbestos exposure. No frank edema or
consolidation. No overt congestive heart failure.

## 2014-11-06 IMAGING — CT CT ANGIO CHEST
2 of 9 series · 17 of 46 positions shown · IV contrast (APPLIED)
Comparison: DG CHEST 2 VIEW dated 08/25/2013; CT ANGIO CHEST dated
05/29/2013

CLINICAL DATA: Rule out pulmonary embolism, follow-up lung
malignancy

EXAM:
CT ANGIOGRAPHY CHEST WITH CONTRAST
TECHNIQUE: Multidetector CT imaging of the chest was performed using the
standard protocol during bolus administration of intravenous
contrast. Multiplanar CT image reconstructions including MIPs were
obtained to evaluate the vascular anatomy.
CONTRAST:  100mL OMNIPAQUE IOHEXOL 350 MG/ML SOLN

[Series 5: thins · axial · 0.66mm/px · z∈[-181,+63]mm · 14 of 276 slices shown]
[im 16/276  lung]
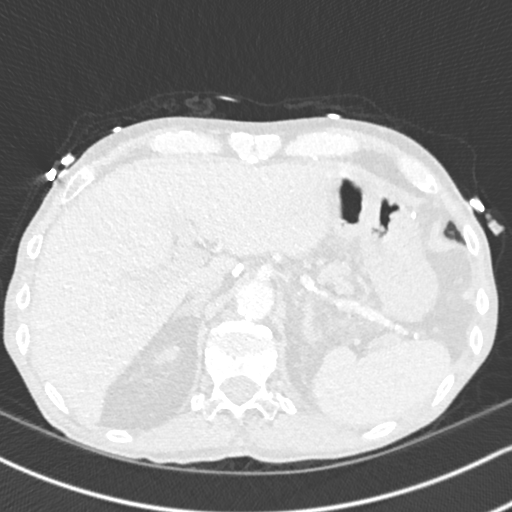
[im 31/276  soft-tissue]
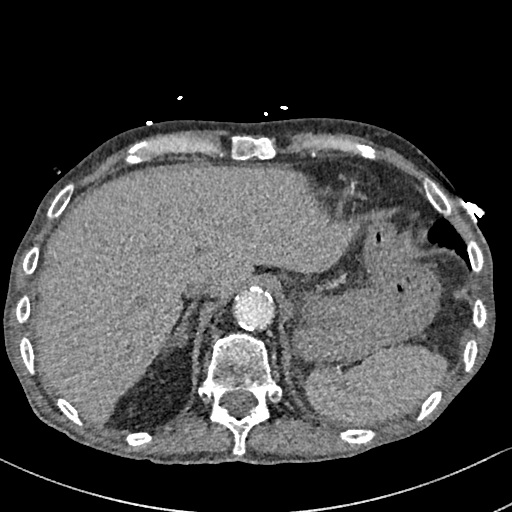
[im 62/276  lung]
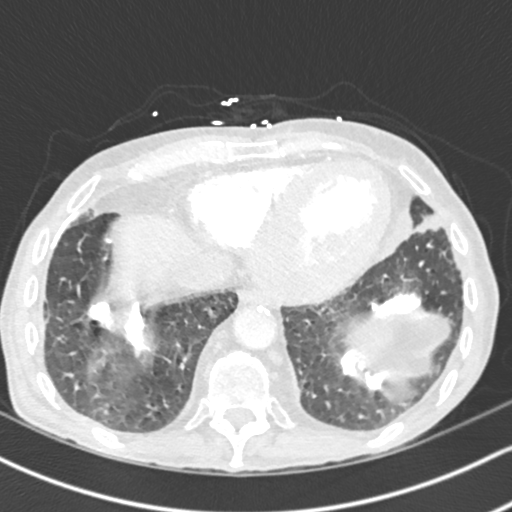
[im 77/276  soft-tissue]
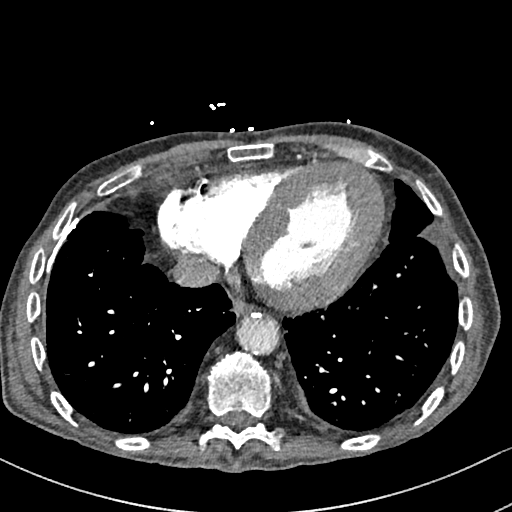
[im 92/276  lung]
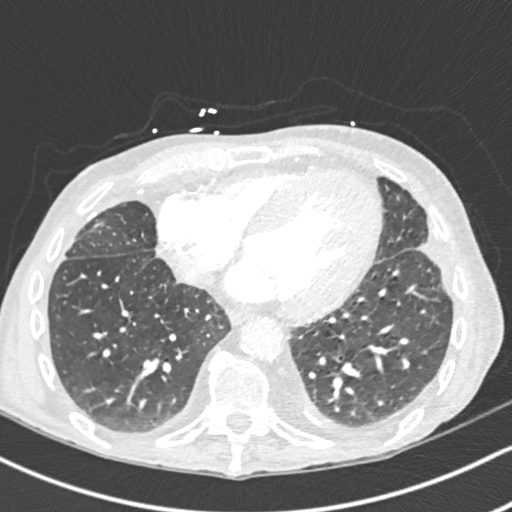
[im 107/276  soft-tissue]
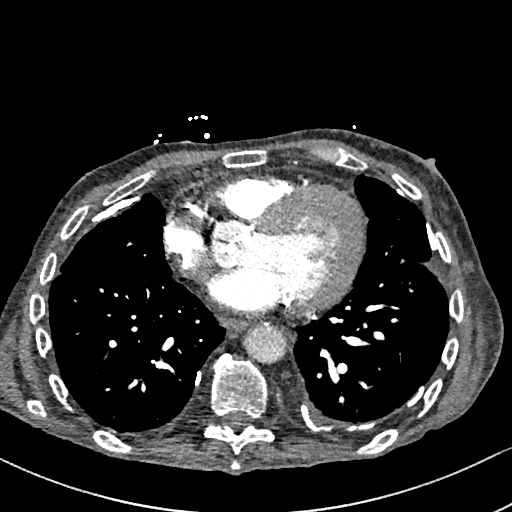
[im 123/276  lung]
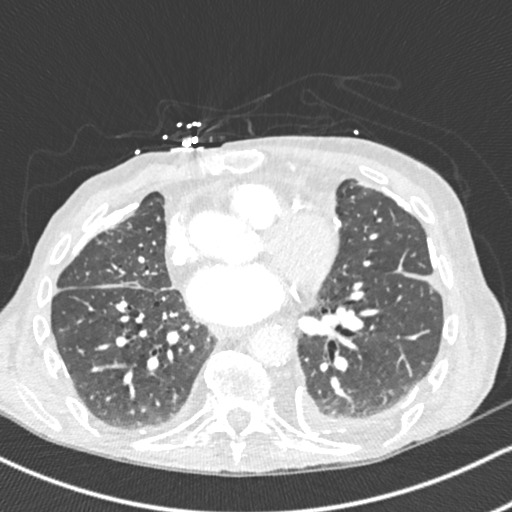
[im 153/276  soft-tissue]
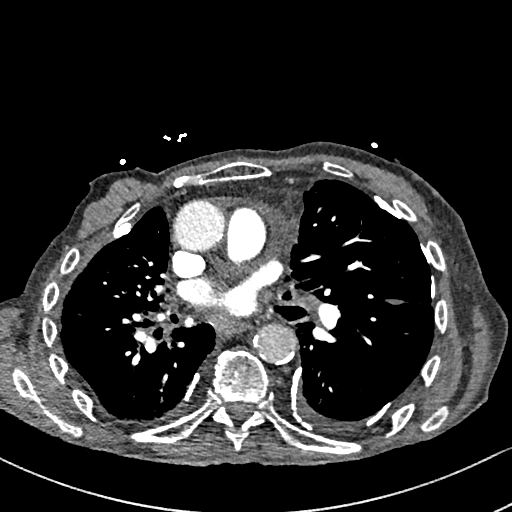
[im 169/276  lung]
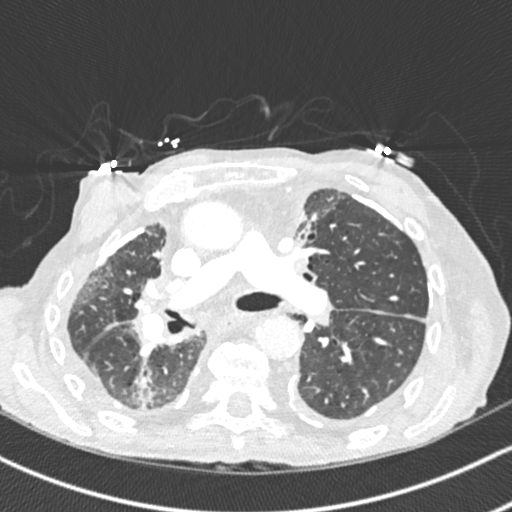
[im 184/276  soft-tissue]
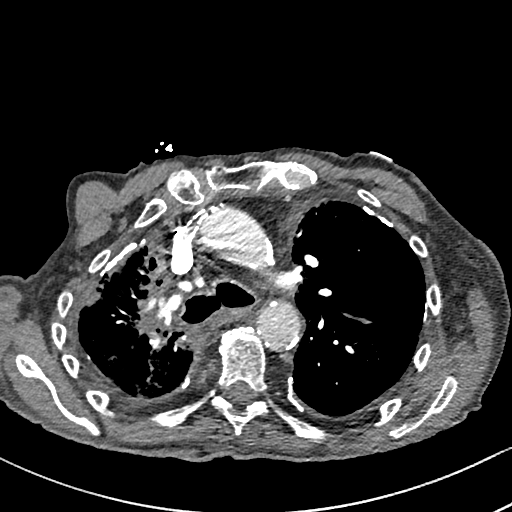
[im 199/276  lung]
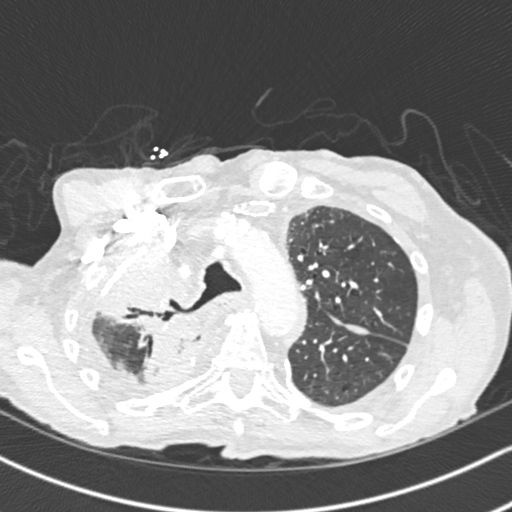
[im 214/276  soft-tissue]
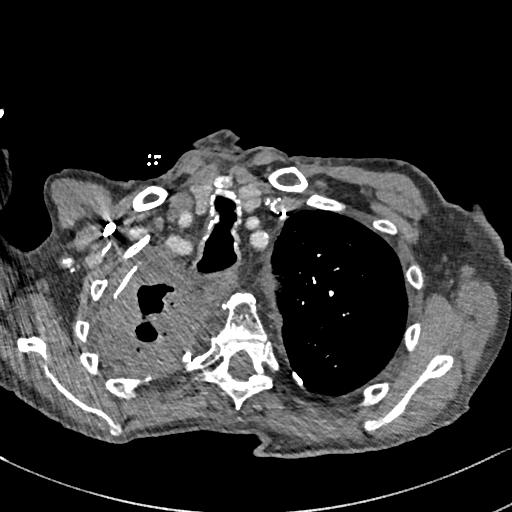
[im 245/276  lung]
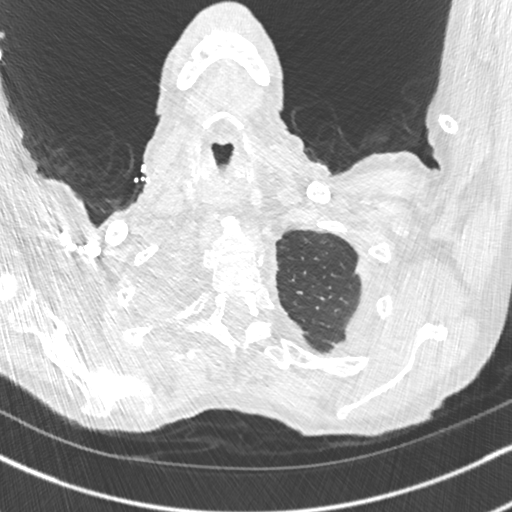
[im 260/276  soft-tissue]
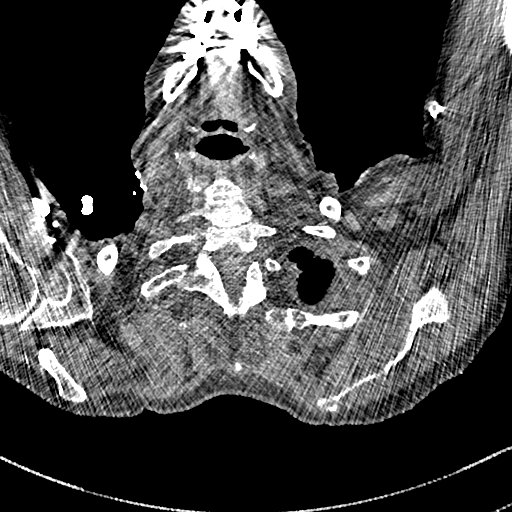

[Series 7: coronal mpr · coronal · 0.59mm/px · 3 of 111 slices shown]
[im 28/111  soft-tissue]
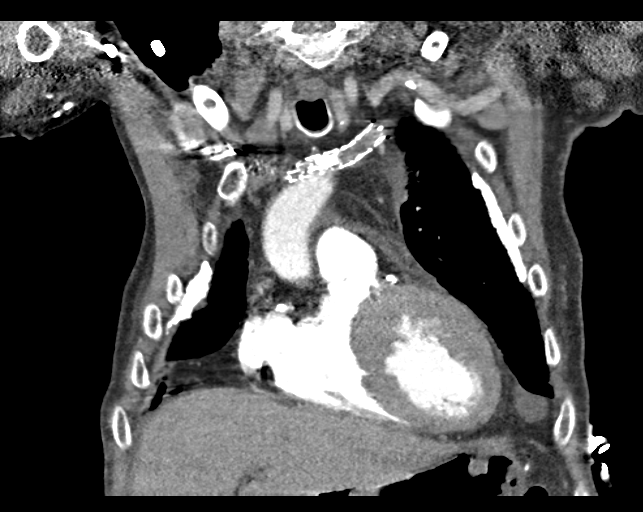
[im 56/111  soft-tissue]
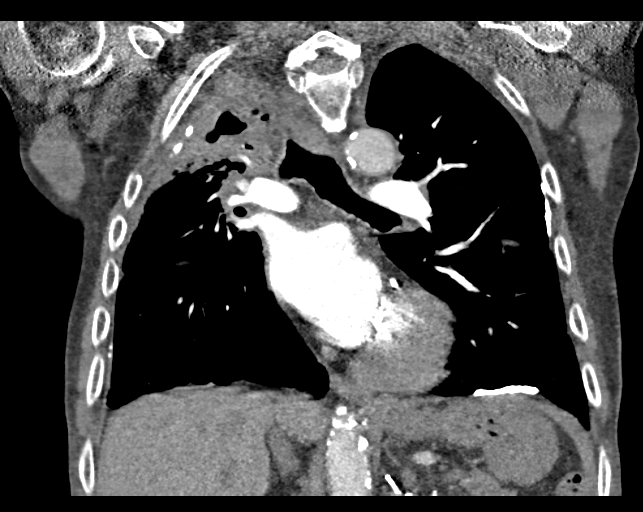
[im 83/111  soft-tissue]
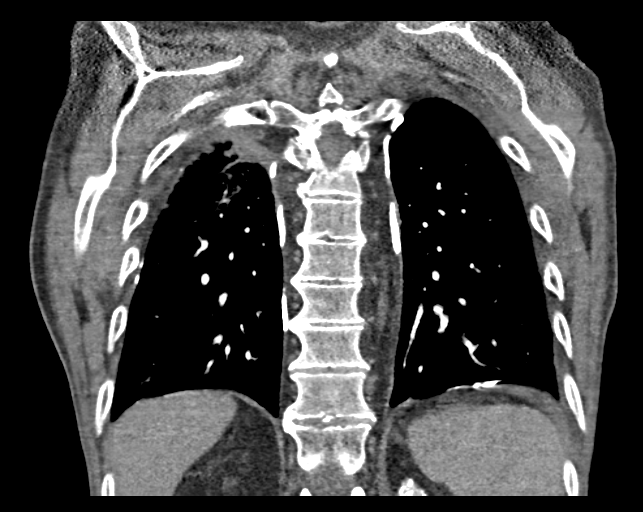

[17 of 46 positions shown; findings below may reference images not displayed]

FINDINGS: Contrast within the pulmonary arterial tree is normal in appearance.
There are no filling defects to suggest an acute pulmonary embolism.
The caliber of the thoracic aorta is normal. No false lumen is
demonstrated. The cardiac chambers are mildly enlarged and stable.
There is no pericardial or pleural effusion. Coronary artery
calcifications are demonstrated. Calcifications of the aortic and
mitral valve are stable. No bulky mediastinal or hilar lymph nodes
are demonstrated. The caliber of the thoracic esophagus appears
normal.

There is abnormal parenchymal consolidation in the right upper lobe
consistent with a thick-walled cavitary lesion. Allowing for
differences in positioning this has not significantly changed since
the previous study. Density previously demonstrated in the posterior
superior aspect of the right lower lobe has improved and increased
interstitial density posteriorly in the left lower lobe is less
prominent today.

Within the upper abdomen the observed portions of the liver and
spleen appear normal. The right kidney is atrophic where visualized.
Calcification of the visualized portions of the left adrenal gland
is demonstrated. Calcification of the pleura over the hemidiaphragms
is stable.

The observed portions of the ribs exhibit no acute abnormalities.
The thoracic vertebral bodies are preserved in height with the
exception of T5 which exhibits mild anterior wedging which is
stable. There is prominent thoracic kyphosis.

Review of the MIP images confirms the above findings.
IMPRESSION: 1. There is no evidence of an acute pulmonary embolism or acute
thoracic aortic pathology.
2. The small bilateral pleural effusions seen on the study May 2013 have resolved.
3. There is stable increased density in the right upper lobe. Patchy
interstitial density in the lower lobes bilaterally has resolved.
4. Extensive pleural calcifications are present bilaterally.

## 2014-11-09 NOTE — Consult Note (Signed)
PATIENT NAME:  Gerald Cooke, Gerald Cooke MR#:  [id number] DATE OF BIRTH:  1941/04/29  DATE OF CONSULTATION:  06/19/2012  REFERRING PHYSICIAN:  Dr. Levonne Hubert  CONSULTING PHYSICIAN:  Shabria Egley R. Ma Hillock, MD  REASON FOR CONSULTATION: Hemoptysis, lung mass.   HISTORY OF PRESENT ILLNESS: The patient is a 74 year old gentleman who was admitted to the hospital earlier today for complaints of persistent hemoptysis of one week's duration. The patient states that he has been coughing up blood clots for the last few days. It got worse in the last 1 to 2 days. He also has mild dyspnea on exertion but otherwise denies any chest pain, fever, or chills. 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 According to chart records, he has been treated by Dr. Oliva Bustard and has received chemotherapy and radiation therapy initially followed by Tarceva orally which he did not tolerate. For the last three years the patient states that he has been on surveillance only and has not been found to have recurrent malignancy. Currently denies any changes in appetite or unintentional weight loss. No new bone pains. He has chronic fatigue and weakness on exertion which is unchanged.   CT scan done on November 26th for follow-up continued to report the right upper lobe lung abnormalities which was felt to be overall stable, also stable 4 x 6 mm nodule adjacent to the major fissure in the extreme anterior aspect of the left lower lobe,  stable 2 mm diameter nodule in the left lower lobe posteromedially, considerable pleural thickening in the right upper lobe also felt to be stable, small amount of pleural fluid on the left side. No masses in the liver or spleen. Labs so far show normal INR of 0.9, platelet count 225, hemoglobin 11.9, creatinine 3.89 (patient on dialysis).   PAST MEDICAL HISTORY/PAST SURGICAL HISTORY: As in history of present illness above.   FAMILY HISTORY: Noncontributory.   SOCIAL HISTORY: Ex-smoker, quit about 18 years ago. Denies alcohol usage. Lives with his wife.   ALLERGIES: Heparin, tetanus/diphtheria toxoid.   HOME MEDICATIONS:  1. Aspirin 325 mg daily.  2. Simvastatin 20 mg daily.  3. Cetirizine 10 mg daily.  4. Renvela 3 tablets t.i.d.  5. Fish Oil 1000 mg 2 capsules daily at bedtime.   6. Cardizem ER 120 mg daily.  7. Metoprolol 25 mg b.i.d.   8. Zofran 4 mg q.6 hours p.r.n. for nausea or vomiting.   REVIEW OF SYSTEMS: CONSTITUTIONAL: As in history of present illness. Currently no fever or chills. No night sweats. HEENT: Denies any headaches, epistaxis, ear or jaw pain. No new progressive sinus symptoms. CARDIAC: Denies any angina at rest, orthopnea, or PND. LUNGS: As in history of present illness. GI: No nausea, vomiting, or diarrhea. Stools have been slightly darker the last 1 or 2 days but no bright red blood in stools. GU: No dysuria or hematuria. SKIN: No new rashes or pruritus. MUSCULOSKELETAL: As in history of present illness. NEUROLOGIC: No new focal weakness, seizures, loss of consciousness. ENDOCRINE: No polyuria or polydipsia.   PHYSICAL EXAMINATION:   GENERAL: The patient is a  moderately built thin individual sitting in bed, alert and oriented and converses appropriately. No acute distress. No pallor. No icterus.   VITAL SIGNS: Temperature 98.5, pulse 86, respirations 16, blood pressure 166/74, 96% on room air.   HEENT: Normocephalic, atraumatic. Extraocular movements intact.  Sclerae anicteric. No oral thrush.   NECK: Supple without lymphadenopathy.   CARDIOVASCULAR: S1, S2, regular rate and rhythm.   LUNGS: Decreased breath sounds in the right upper lobe area, otherwise no rhonchi or crepitations.   ABDOMEN: Soft. No hepatosplenomegaly clinically.   EXTREMITIES: No edema or cyanosis.   SKIN: No generalized rashes.   NEUROLOGIC: Limited exam. Cranial nerves intact. Moves all extremities spontaneously.   MUSCULOSKELETAL: No obvious joint deformity or swelling.   LABORATORY, DIAGNOSTIC, AND RADIOLOGICAL DATA: WBC 8100, hemoglobin 11.9, platelets 225, creatinine 3.8, potassium 4.4, calcium 9.1. INR 0.9.   IMPRESSION AND RECOMMENDATIONS: This is a 74 year old gentleman with known history of multiple medical problems as described above including stage III-B nonsmall-cell right lung cancer status post chemoradiation in 2009 followed by a short course of Tarceva which he did not tolerate. Since then he has been on surveillance only. Recent CT scan on November 26th continued to show chronic changes in the right upper lung. The patient, however, has now been admitted with progressive hemoptysis of a few days' duration. I have explained that this could be inflammatory/infectious etiology versus recurrent malignancy. Agree with ongoing supportive treatment. No evidence of coagulopathy as platelet count and INR is in the normal range. Will also add empiric antibiotic with Augmentin 500 mg p.o. q.24 hours (dialysis dose) to empirically treat any infection and see if hemoptysis improves. Agree with Pulmonary consult. Also, PET scan has already been ordered for tomorrow to look for any definite evidence of recurrent/progressive lung cancer. Will continue to follow. The patient was explained above, agreeable to this plan.   Thank you for the referral. Please feel free to contact me if any additional questions.   ____________________________ Rhett Bannister Ma Hillock,  MD srp:drc D: 06/19/2012 12:12:32 ET T: 06/19/2012 12:56:47 ET JOB#: [id number]  cc: Trevor Iha R. Ma Hillock, MD, <Dictator> Alveta Heimlich MD ELECTRONICALLY SIGNED 06/19/2012 20:14

## 2014-11-09 NOTE — H&P (Signed)
PATIENT NAME:  Gerald Cooke, Gerald Cooke MR#:  [id number] DATE OF BIRTH:  [birth year]  DATE OF ADMISSION:  06/19/2012  PRIMARY CARE PHYSICIAN: Ronette Deter, MD   PRIMARY ONCOLOGIST: Dr. Oliva Bustard    CHIEF COMPLAINT: Hemoptysis.   HISTORY OF PRESENT ILLNESS: The patient is a 74 year old male with a past history of lung carcinoma stage III-B status post chemotherapy and radiation, last treatment about a year ago, who presents with hemoptysis of one weeks' duration.  The patient notes that he was in his usual state of health and actually was seen in follow-up by Dr. Oliva Bustard at the end of October on October 29th and at that time was not having any problems. He had a CT scan at that time which showed a soft tissue density in the right upper hemithorax as well as in the right aspect of the mediastinum which was noted to be maybe stable, however, PET CT was recommended for reassessment. Over the last seven days the patient has developed hemoptysis. He denies chest pain, shortness of breath, fevers, or palpitations. On the day of presentation, the patient had significant hemoptysis. He notes that he was coughing up clots of blood multiple times noting that maybe each time it was about a tablespoon. Due to these symptoms, he presented to the Emergency Department. At baseline he has a cough but does not have any hemoptysis. The patient endorses intermittent night sweats and notes that he has had some weight loss, although he cannot quantify it. The patient obtained CAT scan two days ago on November 26th.   PAST MEDICAL HISTORY:  1. Stage III-B lung cancer with right paratracheal mass, status post chemotherapy and radiation. The patient received carboplatin and Taxol as well as radiation therapy.  2. End-stage renal disease, on hemodialysis Monday, Wednesday, Friday.  3. Hypertension.  4. History of SVC syndrome.  5. History of ruptured aortic aneurysm requiring nephrectomy and partial bowel resection, status post  colostomy.  6. Gastroesophageal reflux disease.   7. Hyperlipidemia.  8. Anemia.  9. Abdominal aortic aneurysm status post rupture and repair.  10. History of spinal abscess.   PAST SURGICAL HISTORY:  1. Left AV graft with thrombosis and status post thrombectomy.  2. Biopsies of lung carcinoma.  3. Aortic aneurysm repair.  4. Nephrectomy and partial bowel resection with colostomy in place.   MEDICATIONS:  1. Aspirin 325 mg daily.  2. Cetirizine 10 mg daily.  3. Simvastatin 20 mg daily.  4. Renvela 3 tablets 3 times a day.  5. Fish Oil 1000 mg 2 caps daily at bedtime.  6. Cardizem 120 mg extended-release tablet daily.  7. Metoprolol tartrate 25 mg twice a day.  8. Ondansetron 4 mg 1 tablet every six hours as needed for nausea and vomiting.   ALLERGIES: Heparin, tetanus/diphtheria toxoid.   FAMILY HISTORY: Mother had diabetes and hypertension. Father is deceased, had lung cancer asbestosis. Sister died of myocardial infarction in her 22's. Son died of myocardial infarction in his 67's.   SOCIAL HISTORY: The patient is married, lives with his wife and his wife has dementia. He used to smoke at least 2-1/2 packs per day for many years per records at least 30 to 40 years. He quit 18 years ago he says. Denies alcohol or illicit drug use.   REVIEW OF SYSTEMS: Denies fever, fatigue, weakness. Admits to weight loss. EYES: Denies blurred vision, double vision, or eye pain. ENT: Denies tinnitus, ear pain. RESPIRATORY: Admits to cough and hemoptysis. Denies wheeze or painful respirations.  CARDIOVASCULAR: Denies chest pain, orthopnea, edema, palpitations. Admits to some dyspnea on exertion. GI: Denies nausea, vomiting, or increased output from his colostomy. Denies abdominal pain. Denies any GI bleeding. GU: The patient is on dialysis. INTEGUMENTARY: No new skin rashes. MUSCULOSKELETAL: Denies arthralgias, myalgias, or swelling in joints. NEUROLOGICAL: Denies numbness, tingling, headaches, or  dysarthria. PSYCH: Denies anxiety or depression.    PHYSICAL EXAMINATION:   VITAL SIGNS: Temperature 98.7, pulse 98, respirations 20, initial blood pressure 184/85. Currently his blood pressure is 158/72.   GENERAL: Elderly Caucasian male resting comfortably on stretcher in no apparent distress.   EYES: Pupils equal, round, and reactive to light and accommodation. Anicteric sclerae. Pink conjunctivae.   ENT: Normal external ears and nares. Posterior oropharynx is clear without erythema or exudate.   NECK: Supple without lymphadenopathy.   CARDIOVASCULAR: S1, S2, regular rate and rhythm. There is a systolic murmur that radiates to the carotids 3 out of 6. There is no pretibial edema.   PULMONARY: Clear to auscultation bilaterally. Normal effort.   ABDOMEN: Soft, nontender, nondistended. No hepatosplenomegaly.   MUSCULOSKELETAL: Full range of motion in bilateral upper and lower extremities. No clubbing. No cyanosis. There is a left AV graft in place with notable thrill.   PSYCH: The patient is awake, alert, oriented x3. Judgment appears intact.   LABORATORY DATA: Troponin less than 0.02. WBC count 8.1, hemoglobin 11.9, hematocrit 33.7, platelets 225 with an MCV of 96, sodium 139, potassium 4.4, chloride 104, bicarb 27, BUN 96, creatinine 3.89 with a glucose of 90, anion gap 8, osmolality 282 with a GFR of 15. INR 0.9.   EKG shows normal sinus rhythm at 99 beats per minute with right bundle branch block and left anterior fascicular block.  CT chest as previously noted showed no dramatic interval change in appearance of the thorax as previous study. Consideration could be given for performing a PET/CT scan to assess area of soft tissue density in the right upper hemithorax as well as in the right aspect of the mediastinum. There are coronary artery calcifications present. There are pleural calcifications bilaterally. There is right renal atrophy.   ASSESSMENT AND PLAN: This is a  74 year old male with history of stage III-B lung carcinoma status post chemotherapy and radiation presenting with progressive hemoptysis.  1. Hemoptysis. At this time there is concern that this might be a result of underlying lung mass, whether it be erosion into the vasculature or from bleeding from the mass itself. We will order PET scan to assess the mass and consult Oncology as well as Pulmonology. The patient will likely need a bronchoscopy for possible biopsy and reassessment of this mass as well as evaluation for ongoing bleed given the quantity of hemoptysis that he's had in the last day. Ongoing monitoring of  CBC for her acute anemia.  2. Hypertension. Continue his metoprolol and Cardizem.  3. End-stage renal disease, on dialysis. Consult Nephrology to continue his hemodialysis schedule.  4. Dyslipidemia. Continue his simvastatin.  5. Prophylaxis. We will hold aspirin given hemoptysis.   CODE STATUS: FULL CODE. Surrogate decision maker is his daughter and Joslyn Hy as his wife has dementia. He does not feel that his wife will have the capacity to make medical decisions for him.   TIME SPENT ON COORDINATING ADMISSION: 45 minutes.   ____________________________ Samson Frederic, DO aeo:drc D: 06/19/2012 06:23:00 ET T: 06/19/2012 10:04:02 ET JOB#: [id number]  cc: Samson Frederic, DO, <Dictator> Eduard Clos. Gilford Rile, MD Martie Lee. Choksi, MD  ____________________________  Samson Frederic, DO aeo:drc D: 06/19/2012 06:23:52 ET T: 06/19/2012 10:22:49 ET JOB#: [id number]  cc: Samson Frederic, DO, <Dictator> Kanyia Heaslip E Barbaraann Avans DO ELECTRONICALLY SIGNED 06/22/2012 2:05

## 2014-11-09 NOTE — Discharge Summary (Signed)
PATIENT NAME:  Gerald Cooke, SCULLY MR#:  [id number] DATE OF BIRTH:  [birth year]  DATE OF ADMISSION:  06/19/2012 DATE OF DISCHARGE:  06/20/2012  ADMISSION DIAGNOSES:  1. Hemoptysis.  2. Known history of non-small cell carcinoma.  DISCHARGE DIAGNOSES:  1. Hemoptysis.  2. History of non-small cell carcinoma.  3. End stage renal disease, on hemodialysis. 4. Hypertension.  5. Hyperlipidemia.   6. CONSULTANTS: Leia Alf, MD. 7. Devona Konig, MD.  8. Munsoor [place], MD.  DISCHARGE LABS: White blood cells 9.1, hemoglobin 10.5, hematocrit 32, and platelets 225. Troponins are negative.   HOSPITAL COURSE: This is a  74 year old male with known non-small cell lung carcinoma who presented with a week of hemoptysis. For further details, please refer to the History and Physical. 1. Hemoptysis. The patient's hemoptysis pretty much resolved. He is having one maybe two slightly striped episodes of hemoptysis. There was thought that he may have an underlying infection. He was started on Augmentin. The plan was for PET; scan however, the PET scanner was broken. He will follow up with Dr. Forest Gleason on Monday for further evaluation. He has no evidence of massive hemoptysis.  2. Non-small cell lung carcinoma. The patient will follow up with Dr. Forest Gleason at discharge. Appreciate Dr. Trevor Iha Pandit's consult.  3. Hypertension, fairly relatively controlled while in the hospital.  4. Endstage renal disease, on hemodialysis: The patient did receive dialysis while in the hospital.  5. Hyperlipidemia. The patient was continued on his outpatient medications.  DISCHARGE MEDICATIONS:  1. Simvastatin 20 mg at bedtime.  2. Fish oil 1000 mg two tablets at bedtime.  3. Metoprolol 25 mg twice a day. 4. Zofran 4 mg every six hours p.r.n.  5. Diltiazem 120 mg daily.  6. Imodium 2 mg twice a day p.r.n.  7. Renvela three tablets three times daily. 8. Augmentin 1 tablet twice a day for 10 days.  NOTE: The patient  will stop taking aspirin for now.   DISCHARGE DIET: Renal diet.   DISCHARGE ACTIVITY: As tolerated.   DISCHARGE FOLLOWUP: The patient will follow up on Monday with Dr. Forest Gleason.  The patient is medically stable for discharge.   TIME SPENT: Approximately 35 minutes.  ____________________________ Donell Beers. Benjie Karvonen, MD spm:slb D: 06/20/2012 11:10:58 ET T: 06/20/2012 11:23:03 ET JOB#: [id number]  cc: Gurney Balthazor P. Benjie Karvonen, MD, <Dictator> Martie Lee. Oliva Bustard, MD Donell Beers Anwen Cannedy MD ELECTRONICALLY SIGNED 06/20/2012 12:34

## 2014-11-10 IMAGING — CT CT HEART MORPH/PULM VEIN W/ CM & W/O CA SCORE
1 series · 1 of 1 positions shown · non-contrast
Comparison: CTA of the chest, abdomen and pelvis 08/29/2013. CT of
the chest 05/29/2013.

CLINICAL DATA: Aorticstenosis

EXAM:
Cardiac TAVR CT
TECHNIQUE: The patient was scanned on a Philips 256 scanner. A 120 kV
retrospective scan was triggered in the descending thoracic aorta at
111 HU's. Gantry rotation speed was 270 msecs and collimation was .9
mm. 10 mg of iv Metoprolol and no nitro was given. The 3D data set
was reconstructed in 5% intervals of the R-R cycle. Systolic and
diastolic phases were analyzed on a dedicated work station using
MPR, MIP and VRT modes. The patient received 80 cc of contrast.

[Series 2: — · coronal · 0.6mm · 0.98mm/px · 1 of 1 slices shown]
[im 1/1]
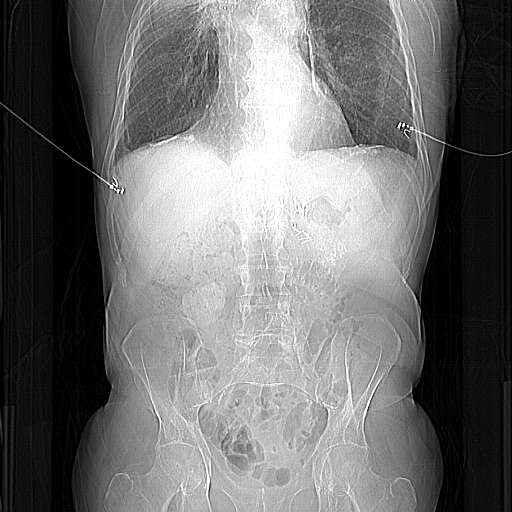

[1 of 1 positions shown; findings below may reference images not displayed]

FINDINGS: Aortic Valve: Moderately thickened and severely calcified with
limited but still somewhat preserved leaflet opening.

Aorta: Mild aortic calcifications in the ascending and descending
thoracic aorta and moderate calcifications in the aortic arch
predominantly at the origin of the brachiocephalic arteries.

Sinotubular Junction:  29 x 29 mm

Ascending Thoracic Aorta:  32 x 31 mm

Aortic Arch:  27 x 27 mm

Descending Thoracic Aorta:  26 x 26 mm

Sinus of Valsalva Measurements:

Non-coronary:  34 mm

Right -coronary:  35 mm

Left -coronary:  35 mm

Coronary Artery Height above Annulus:

Left Main:  11 mm

Right Coronary:  18 mm

Virtual Basal Annulus Measurements:

Maximum/Minimum Diameter:  25.7 x 29.6 mm

Perimeter:  91 mm

Area:  5.8 cm2

Coronary Arteries: Heavily calcified, the study is insufficient for
coronary artery read, no nitro was given.

Optimum Fluoroscopic Angle for Delivery:  LAO 10, RAKHI 10.

Distance from annulus to sheath tip is 70 mm (>60 mm required for 29
mm valve).
IMPRESSION: 1. Severely calcified aortic valve with measurement suitable for 29
mm Edwards-Sapien Valve.

2.  Optimum deployment angle is LAO 10, RAKHI 10.

3.  Sufficient distance from annulus to coronary arteries.

4. Sufficient distance from annulus to sheath tip for transaortic
approach.

Sastro Champ

EXAM:
OVER-READ INTERPRETATION  CT CHEST

The following report is an over-read performed by radiologist Dr.
over-read does not include interpretation of cardiac or coronary
anatomy or pathology. The interpretation by the cardiologist is
attached.
FINDINGS: Please see separate dictation for CTA of the chest, abdomen and
pelvis from 08/29/2013 for full description of extracardiac
findings.
IMPRESSION: Please see separate dictation for CTA of the chest, abdomen and
pelvis from 08/29/2013 for full description of extracardiac
findings.

## 2014-11-12 NOTE — Op Note (Signed)
PATIENT NAME:  Gerald Cooke, Gerald Cooke MR#:  [id number] DATE OF BIRTH:  1941/06/03  DATE OF PROCEDURE:  03/10/2013  PREOPERATIVE DIAGNOSES:  1. End-stage renal disease requiring hemodialysis.  2. Poorly functioning dialysis access.  POSTOPERATIVE DIAGNOSES:  1. End-stage renal disease requiring hemodialysis.  2. Poorly functioning dialysis access.  PROCEDURES PERFORMED:  1. Contrast injection, left brachial axillary dialysis graft.  2. Percutaneous transluminal angioplasty to 8 mm of the venous portion of the arteriovenous graft.   PROCEDURE PERFORMED BY: Katha Cabal, MD  SEDATION: Versed 4 mg plus fentanyl 150 mcg administered IV. Continuous ECG, pulse oximetry and cardiopulmonary monitoring was performed throughout the entire procedure by the interventional radiology nurse. Total sedation time was approximately 1 hour.   ACCESS:  A 6 French sheath, antegrade direction, left arm AV graft.   CONTRAST USED: Isovue 30 mL.   FLUOROSCOPY TIME: Approximately 4 minutes.   INDICATIONS: The patient is having increasing difficulties with his dialysis access and worsening efficiency of his dialysis with poor dialysis parameters. The risks and benefits for angiography and the hope for intervention were reviewed. All questions answered. The patient agrees to proceed.   DESCRIPTION OF PROCEDURE: The patient is taken to special procedures and placed in the supine position. After adequate sedation is achieved, he is positioned supine with his left arm extended palm upward. The left arm is prepped and draped in a sterile fashion. Appropriate timeout is called.   The graft is then accessed using a micropuncture needle in an antegrade direction at a level near the arterial anastomosis. Microwire followed by micro sheath, J-wire followed by a 6 French sheath is inserted. Hand injection of contrast is then utilized to image the graft. A high-grade stenosis is noted at the level of the venous cannulation zone.  This is greater than 70%. The outflow previously stented segment through the anastomosis with the axillary vein remains widely patent. Proximal axillary vein, subclavian, innominate and superior vena cava, including the stented segment of the innominate, all remain widely patent.   THE PATIENT IS ALLERGIC TO HEPARIN, SO NO HEPARIN WAS GIVEN. A Magic Torque wire was advanced, and an 8 x 6 balloon was used to perform angioplasty of the venous portion. Two separate inflations were made to 14 atmospheres, each for a minute. Followup angiography demonstrates an excellent result, with wide patency of the graft and rapid flow.   Pursestring suture is placed around the sheath. Sheath is pulled, and there are no immediate complications.   INTERPRETATION: Initial views of the arteriovenous graft demonstrate a fairly focused high-grade stenosis within the graft itself right at the level of the venous access zone. Outflow is otherwise patent. Previously stented segments both at the venous anastomotic level as well as in the central veins are all widely patent. Followup angioplasty is then performed with an excellent result and minimal residual stenosis. Therefore, no further stents were placed. The patient tolerated the procedure well, and there were no immediate complications.   ____________________________ Katha Cabal, MD ggs:OSi D: 03/11/2013 11:57:45 ET T: 03/11/2013 13:17:05 ET JOB#: [id number]  cc: Katha Cabal, MD, <Dictator> Katha Cabal MD ELECTRONICALLY SIGNED 04/03/2013 9:10

## 2014-11-12 NOTE — Consult Note (Signed)
PATIENT NAME:  Gerald Cooke, Gerald Cooke MR#:  [id number] DATE OF BIRTH:  [birth year]  DATE OF CONSULTATION:  05/29/2013  CONSULTING PHYSICIAN:  Timoteo Gaul, MD  REASON FOR CONSULTATION: T5 compression fracture.   HISTORY OF PRESENT ILLNESS: Gerald Cooke is a 74 year old male with stage IIIB lung cancer. His oncologist is Dr. Oliva Bustard. He also has end-stage renal disease and is on hemodialysis 3 days a week. The patient was admitted to medical service for acute respiratory failure. He presented with symptoms of chest congestion, cough and shortness of breath. The patient was admitted to the medical service for further evaluation. On CT angiogram, which was ordered to rule out pulmonary embolism, the patient was noted to have an abnormal appearance to the T5 vertebra. There was evidence of a compression deformity of T5. Therefore, I have been consulted to provide recommendations for this finding on CT.   PAST MEDICAL HISTORY: Includes stage IIIB lung cancer, end-stage renal disease with hemodialysis, hypertension, SVC syndrome, ruptured aortic aneurysm requiring nephrectomy and partial bowel resection, status post colostomy, GERD, hyperlipidemia, anemia, abdominal aortic aneurysm status post repair and previous spinal abscess.   PAST SURGICAL HISTORY:  Includes a left AV graft with thrombosis status post thrombectomy, status post biopsy of lung carcinoma, aortic aneurysm repair and nephrectomy with partial bowel resection. Colostomy is in place.   ALLERGIES: HEPARIN, TETANUS AND DIPHTHERIA TOXOID.   SOCIAL HISTORY: The patient is married and lives with his wife. He is a former smoker and quit 18 to 20 years ago. He denies alcohol or drug use.   MEDICATIONS: Include Renvela 800 mg 2 tablets t.i.d., Zofran 4 mg p.o. q.6 hours p.r.n., metoprolol 25 mg p.o. b.i.d., Lipitor 20 mg p.o. daily, aspirin 81 mg p.o. daily, amlodipine 10 mg p.o. daily.  PHYSICAL EXAMINATION:  The patient is seen sitting semi-reclined  in his hospital bed. He denies any back pain, particularly between his scapulae. He had no tenderness over the thoracic spine. There were no outward signs of injury or trauma, such as ecchymosis, erythema or swelling. There is no abnormal curvature to his spine. He had intact motor and sensory function in both upper and lower extremities.   RADIOLOGY: Chest films and a chest CT have been performed. I have reviewed these studies. The chest CT demonstrated an irregular T5 vertebral body. There was mild anterior wedging. There was no retropulsion of any fragments of the vertebral body. The radiologist read that there was the possibility of a metastatic lesion and developing pathologic compression fracture.   ASSESSMENT: T5 compression abnormality seen on CT in the absence of back pain.   PLAN: I explained to Gerald Cooke the findings on his CAT scan. I am going to order an MRI to evaluate for the acuity of this deformity and to also further evaluate for any lesions seen within the vertebrae. I recommend the oncology team be consulted for further management and staging. I will follow up on the MRI results when they are available. The patient's activity is as tolerated at this point.   I am not a spine surgeon and cannot provide any interventional support to this patient if his MRI warrants any further intervention. Therefore, the patient will have to be transferred to a facility with spine surgical services if a biopsy or intervention on the compression fracture is warranted by the MRI results.   ____________________________ Timoteo Gaul, MD klk:jcm D: 05/29/2013 18:06:02 ET T: 05/29/2013 18:26:05 ET JOB#: [id number]  cc: Timoteo Gaul, MD, <Dictator>  Timoteo Gaul MD ELECTRONICALLY SIGNED 06/15/2013 19:23

## 2014-11-12 NOTE — Consult Note (Signed)
History of Present Illness:  Reason for Consult Patient was admitted with progressive shortness of breath Hypoxia History of carcinoma of lung Chronic renal failure on dialysis   HPI   patient has been admitted with progressive shortness of breath.  According to patient started approximately week ago when he started getting more and more short of breath.  Has cough yellowish expectoration  Poor appetite.  Patient is on chronic dialysis and has multiple other medical problems  Patient has lost significant amount of weight.getting dialysis  PFSH:  Additional Past Medical and Surgical History Past medical history: Renal failure requiring dialysis, Hypertension.  Past surgical history: Ruptured aortic aneurysm repair requiring nephrectomy, partial bowel resection status post colostomy.  Family history: Negative and noncontributory.  Social history: Remote history of tobacco use, denies alcohol.   Review of Systems:  General weakness  fatigue   Performance Status (ECOG) 1   Lungs cough  SOB   Cardiac no complaints   GI Poor appetite   GU no complaints   Musculoskeletal back pain   Extremities swelling   Skin no complaints   Neuro no complaints   Psych anxiety  sleep disturbance   Review of Systems   declining condition.  Significant weight loss.  NURSING NOTES: ED Vital Sign Flow Sheet:   07-Nov-14 03:37   Temp Source: oral   Pulse Pulse: 85   Respirations Respirations: 18   SBP SBP: 169   DBP DBP: 74   Pulse Ox % Pulse Ox %: 96   Pulse Ox Source Source: Room Air   Pain Scale (0-10) Pain Scale (0-10): Scale:0  NURSING NOTES: **Vital Signs.:   07-Nov-14 09:42   Vital Signs Type: Admission   Pulse Pulse: 79   Respirations Respirations: 20   Systolic BP Systolic BP: 573   Diastolic BP (mmHg) Diastolic BP (mmHg): 87   Mean BP: 118   Pulse Ox % Pulse Ox %: 95   Pulse Ox Activity Level: At rest   Oxygen Delivery: 2L   Physical Exam:   General Patient has lost significant amount of weight.  Lying in the bed in dialysis unit.   HEENT: normal   Lungs: rales  crepitations  wheezing  Diminished at entry   Cardiac: Irregular heart sounds   Abdomen: soft  nontender  positive bowel sounds   Skin: intact   Extremities: edema   Neuro: AAOx3  cranial nerves intact   Physical Exam LYMPHATICS:   No cervical, axillary, or inguinal lymphadenopathy     ESRD:    Lung Cancer:    Anemia:    Renal Insufficiency:    Previous Blood Transfusions:    Dialysis -  m,w,f:    Hypertension:    AAA:    reflux:    high cholesterol:    Plastic Surgery/Mouth:    Colostomy:    abcess on spine:    Tetanus /Diphtheria Toxoid: Other  Heparin: Other    amlodipine 10 mg oral tablet: 1 tab(s) orally once a day, Status: Active, Quantity: 0, Refills: None   metoprolol tartrate 25 mg oral tablet: 1 tab(s) orally 2 times a day, Status: Active, Quantity: 0, Refills: None   ondansetron 4 mg oral tablet: 1 tab(s) orally every 6 hours, As Needed- for Nausea, Vomiting , Status: Active, Quantity: 0, Refills: None   aspirin 81 mg oral tablet: 1 tab(s) orally once a day, Status: Active, Quantity: 0, Refills: None   atorvastatin 20 mg oral tablet: 1 tab(s) orally once a day, Status:  Active, Quantity: 0, Refills: None   Renvela carbonate 800 mg oral tablet: 3 tab(s) orally 3 times a day (before meals),1 with snacks, Status: Active, Quantity: 0, Refills: None  Laboratory Results: Hepatic:  07-Nov-14 04:34   Bilirubin, Total 0.5  Alkaline Phosphatase 85  SGPT (ALT) 22  SGOT (AST) 18  Total Protein, Serum 7.3  Albumin, Serum 3.4  Routine Chem:  07-Nov-14 04:34   B-Type Natriuretic Peptide [facility])  [phone] (Result(s) reported on 29 May 2013 at 05:12AM.)  Glucose, Serum  102  BUN  35  Creatinine (comp)  5.65  Sodium, Serum 136  Potassium, Serum  5.7  Chloride, Serum 106  CO2, Serum 26  Calcium (Total), Serum 9.3  Osmolality  (calc) 280  eGFR (African American)  11  eGFR (Non-African American)  9 (eGFR values <16m/min/1.73 m2 may be an indication of chronic kidney disease (CKD). Calculated eGFR is useful in patients with stable renal function. The eGFR calculation will not be reliable in acutely ill patients when serum creatinine is changing rapidly. It is not useful in  patients on dialysis. The eGFR calculation may not be applicable to patients at the low and high extremes of body sizes, pregnant women, and vegetarians.)  Anion Gap  4    12:32   Phosphorus, Serum  6.3 (Result(s) reported on 29 May 2013 at 01:48PM.)  Cardiac:  07-Nov-14 04:34   CK, Total 54  CPK-MB, Serum 2.8 (Result(s) reported on 29 May 2013 at 05:12AM.)  Troponin I 0.05 (0.00-0.05 0.05 ng/mL or less: NEGATIVE  Repeat testing in 3-6 hrs  if clinically indicated. >0.05 ng/mL: POTENTIAL  MYOCARDIAL INJURY. Repeat  testing in 3-6 hrs if  clinically indicated. NOTE: An increase or decrease  of 30% or more on serial  testing suggests a  clinically important change)  Routine Hem:  07-Nov-14 04:34   WBC (CBC) 7.0  RBC (CBC)  3.22  Hemoglobin (CBC)  10.7  Hematocrit (CBC)  31.5  Platelet Count (CBC) 177 (Result(s) reported on 29 May 2013 at 04:49AM.)  MCV 98  MCH 33.3  MCHC 34.0  RDW  15.2   Radiology Results: LabUnknown:    09-Sep-14 13:02, CT Chest Without Contrast  PACS Image     07-Nov-14 06:10[facility]   CT:    09-Sep-14 13:02, CT Chest Without Contrast  CT Chest Without Contrast   REASON FOR EXAM:    follow up lung nodule  COMMENTS:       PROCEDURE: KCT - KCT CHEST WITHOUT CONTRAST  - Mar 31 2013  1:02PM     RESULT: Axial noncontrast CT scanning was performed through the chest   with reconstructions at 3 mm intervals and slicethicknesses. Comparison   made to the study of August 31, 2012, and June 17, 2012. Review of   multiplanar reconstructed images was performed  separately on the VIA   monitor.    There is persistent increased density throughout much of the upper   portion of the right upper lobe. The right middle and lower lobes are   relatively clear. The left lung exhibits stable thickening of the pleura   with associated calcification inferolaterally. There is no alveolar     infiltrate in the left lung.    At mediastinal window settings stable increased density in the right   perihilar region is present that likely reflects scarring. Extensive   pleural calcifications are present bilaterally. The cardiac chambers are   normal  in size. Coronary artery calcifications are present. There is a   stent in place involving the central portion of the left subclavian   artery. The caliber of the thoracic aorta is normal.    There is prominent thoracic kyphosis with mild wedge compression of   approximately T6.    Within the upper abdomen the observed portions of the liver and spleen   are normal. There are no adrenal masses, but there is retroperitoneal   calcification that is of uncertain etiology. This is stable where     visualized.    IMPRESSION:    1. There is a stable appearance of parenchymal consolidation in the right   upper lobe. There is stable pleural thickening and pleural calcification   elsewhere in both hemithoraces.  2. Stable increased density in the right superhilar region is present.  3. There are coronary artery calcifications but no overt evidence of CHF.     Dictation Site: 2        Verified By: DAVID A. Martinique, M.D., MD   Assessment and Plan: Impression:   1. Carcinoma of lung, stage IIIB. 2. Chronic renal failure.scan has been reviewed shows progressive changes seen the lungofrecurrent progressing disease (lung cancer) combined withcongestive heart failure your, infection had detailed discussion with patient.would like to treat patient with combination of antibiotic.  Bronchodilator therapy, steroid and    diuresis .   on improvement possibility of further treatment options can be discussed.understands that  he may not be a candidate for any aggressive chemotherapy.to him he does not want to be resuscitated  or go on ventilator.  will follow this patient along with you   and within  next 48 hours depending on improvement in Gen. condition a further decision can be made.   CC Referral:  cc: Dr. Ronette Deter. Dr Max Sane   Electronic Signatures: Jobe Gibbon (MD)  (Signed [phone] 14:10)  Authored: HISTORY OF PRESENT ILLNESS, PFSH, ROS, NURSING NOTES, PE, PAST MEDICAL HISTORY, ALLERGIES, HOME MEDICATIONS, LABS, OTHER RESULTS, ASSESSMENT AND PLAN, CC Referring Physician   Last Updated: 07-Nov-14 14:10 by Jobe Gibbon (MD)

## 2014-11-12 NOTE — Discharge Summary (Signed)
PATIENT NAME:  Gerald Cooke, Gerald Cooke MR#:  [id number] DATE OF BIRTH:  [birth year]  DATE OF ADMISSION:  05/29/2013 DATE OF DISCHARGE:  06/01/2013  PRIMARY CARE PHYSICIAN:  Anderson Malta A. Gilford Rile, MD  DISCHARGE DIAGNOSES: 1.  Acute respiratory failure with shortness of breath, hypoxia, likely due to fluid overload.  2.  Possible acute diastolic congestive heart failure.  3.  End-stage renal disease.  4.  T5 vertebral body fracture.  5.  Lung cancer, stage IIIB.  6.  Anemia of chronic disease.  7.  Hypertension.   CONDITION: Stable.   CODE STATUS: Full code.   HOME MEDICATIONS: Please refer to the [facility] physician discharge instruction medication reconciliation list.   DIET: Low-sodium, renal diet.   ACTIVITY: As tolerated.   FOLLOWUP CARE:  1.  Follow with PCP within 1 to 2 weeks.  2.  Follow up with Dr. Candiss Norse and dialysis within 1 to 2 weeks.  3.  Follow up with Dr. Mack Guise, orthopedic surgeon, within 1 to 2 weeks.  4.  The patient also needs a followup MRI of the spine report.  5.  Follow up with Dr. Oliva Bustard within 1 to 2 weeks.   REASON FOR ADMISSION: Shortness of breath.   CONSULTATIONS: Orthopedic surgeon, Dr. Mack Guise, and nephrology, Dr. Candiss Norse.   HOSPITAL COURSE: The patient is a 74 year old Caucasian male with a history of lung cancer, stage B3 followed by Dr. Oliva Bustard and ESRD on dialysis, presenting to the ED with shortness of breath, chest congestion, cough, sputum. For detailed history and physical examination, please refer to the admission note dictated by Dr. Manuella Ghazi. Laboratory data on admission date showed CBC in normal range except hemoglobin 10.7. BUN 35, creatinine 5.65, potassium 5.7. BNP 23,611. Chest x-ray showed pulmonary vascular congestion. CAT scan of chest showed no pulmonary emboli.  1.  For acute respiratory failure with shortness of breath, hypoxia likely due to fluid overload. The patient's O2 saturation decreased to 86 % on room air. After admission, the patient has  been treated with O2 by nasal cannula. In addition, patient got emergent hemodialysis. After dialysis, the patient's symptoms have much improved. The patient's lungs sounds are clear today.  2.  For hyperkalemia, after dialysis potassium decreased to normal range.  3.  Possible acute diastolic dysfunction due to fluid overload, now stable.  4.  Lung cancer. The patient needs to follow up with Dr. Oliva Bustard as outpatient.  5.  T5 vertebral body fracture. According to Dr. Mack Guise, the patient needs an MRI of the spine but the patient could not get an MRI for the past 2 days due to concern about past history of aortic surgery with some stent. Now, today, the patient is cleared for MRI.  He will get an MRI of the spine today. 6. For ESRD, the patient is on dialysis   The patient has no complaints. Vital signs are stable. He is clinically stable and will be discharged to home today. The patient needs a followup MRI report and followup with Dr. Mack Guise and Dr. Oliva Bustard as outpatient I discussed the patient's discharge plan with the patient and nurse case manager.   TIME SPENT: About 68 minutes.   ____________________________ Demetrios Loll, MD qc:cs D: 06/01/2013 15:27:00 ET T: 06/01/2013 19:31:47 ET JOB#: [id number]  cc: Demetrios Loll, MD, <Dictator> Demetrios Loll MD ELECTRONICALLY SIGNED 06/02/2013 13:37

## 2014-11-12 NOTE — H&P (Signed)
PATIENT NAME:  Gerald Cooke, KANTNER MR#:  [id number] DATE OF BIRTH:  [birth year]  DATE OF ADMISSION:  05/29/2013  PRIMARY CARE PHYSICIAN: Ronette Deter, MD  ONCOLOGIST: Forest Gleason, MD  REQUESTING PHYSICIAN: Ferman Hamming, MD  CHIEF COMPLAINT: Shortness of breath.   HISTORY OF PRESENT ILLNESS: The patient is a 74 year old male with stage IIIB lung cancer, followed by Dr. Oliva Bustard, end-stage renal disease on hemodialysis Monday, Wednesday and Friday who is being admitted for acute respiratory failure likely due to fluid overload. The patient started having chest congestion, cough and shortness of breath starting earlier in the week, on Monday. Kept getting worse. He could not expectorate. He denies any fever. His breathing was getting worse, even after his dialysis on Wednesday, and finally decided to come this morning as he was having it significant worse and could not manage at home.   He also reports feeling worse when he lies down.   PAST MEDICAL HISTORY: 1.  Stage IIIB lung cancer with right paratracheal mass status post chemo and radiation. 2.  End-stage renal disease on hemodialysis Monday, Wednesday and Friday. 3.  Hypertension.  4.  SVC syndrome. 5.  Ruptured aortic aneurysm requiring nephrectomy and partial bowel resection status post colostomy.  6.  GERD. 7.  Hyperlipidemia. 8.  Anemia. 9.  Abdominal aortic aneurysm status post rupture and repair.  10.  Spinal abscess.   PAST SURGICAL HISTORY: 1. Left AV graft with thrombosis and status post thrombectomy. 2.  Biopsy of lung carcinoma. 3.  Aortic aneurysm repair. 4.  Nephrectomy and partial bowel resection with colostomy in place.   ALLERGIES: Heparin, tetanus/diphtheria toxoid.   FAMILY HISTORY: Mother with hypertension and diabetes. Father is deceased. Had lung cancer, asbestosis. Sister died of MI in her 43s. Son died of MI in his 65s.   SOCIAL HISTORY: He is married, lives with his wife. He is a former smoker, quit 18 to  20 years ago. No alcohol or drug use.   MEDICATIONS AT HOME: 1.  Renvela 800 mg 2 tablets p.o. 3 times a day. 2.  Zofran 4 mg p.o. every 6 hours as needed. 3.  Metoprolol 25 mg p.o. b.i.d.  4.  Lipitor 20 mg p.o. daily.  5.  Aspirin 81 mg p.o. daily. 6.  Amlodipine 10 mg p.o. daily.   REVIEW OF SYSTEMS: CONSTITUTIONAL: No fever. Positive for fatigue and weakness.  EYES: No blurred or double vision.  ENT: No tinnitus, ear pain.  RESPIRATORY: Positive for dry cough. No wheezing. Positive for dyspnea.  CARDIOVASCULAR: Some chest tightness and orthopnea. No edema. Dyspnea on exertion present.  GASTROINTESTINAL: No nausea, vomiting or diarrhea. GENITOURINARY: No dysuria or hematuria. ENDOCRINE: No polyuria or nocturia.  HEMATOLOGY: No anemia or easy bruising.  SKIN: No rash or lesion.  MUSCULOSKELETAL: No arthritis or muscle cramp.  NEUROLOGIC: No tingling, numbness, weakness.  PSYCHIATRIC: No history of anxiety or depression.   PHYSICAL EXAMINATION: VITAL SIGNS: Temperature 96.3, heart rate 85 per minute, respirations 18 per minute, blood pressure 169/74 mmHg and he is saturating 96% on room air. GENERAL: The patient is a 74 year old male lying in the bed in mild acute respiratory distress.  EYES: Pupils equal, round and reactive to light and accommodation. No scleral icterus. Extraocular muscles intact.  HEAD: Atraumatic, normocephalic. Oropharynx and nasopharynx clear.  NECK: Supple. No jugular venous distention. No thyroid enlargement or tenderness.  LUNGS: Decreased breath sounds at the bases, rhonchi at the right lung base, crackles at the bases. No rales. No accessory  muscles of respiration in use.  CARDIOVASCULAR: S1 and S2 normal, loud systolic murmur radiating to carotids, 3/6, can be heard all over the chest, also on the left.  ABDOMEN: Soft, nontender, nondistended. Bowel sounds present. No organomegaly or mass.  EXTREMITIES: No pedal edema, cyanosis or clubbing.   NEUROLOGIC: Cranial nerves II through XII intact. Muscle strength 5/5 in all extremities. Sensation intact.  PSYCHIATRIC: The patient is alert and oriented x 3.   SKIN: No obvious rash, lesion or ulcer.   LABORATORY AND DIAGNOSTICS: Normal CBC except hemoglobin of 10.7, hematocrit 31.5. Normal first set of cardiac enzymes. Normal liver function tests. BMP within normal limits, except creatinine of 5.65, BUN 35 and potassium 5.7. BNP 23,611.   Chest x-ray, in the ED, showed pulmonary vascular congestion, possible superimposed infection and/or edema cannot be excluded.   CT scan of the chest showed no pulmonary embolism, possible progression of lung disease with widespread lymphangitic spread of tumor and widespread pleural disease. Large thick-walled cavity similar to prior studies seen in the apex of the right lung. Mild interstitial pulmonary edema. Atherosclerosis including left main and three-vessel coronary artery disease. Mild irregular sclerosis of the anterior aspect of the T5 vertebral body associated with mild anterior wedging. Possible metastatic lesion and developing pathological compression fracture in this region is not excluded.   IMPRESSION AND PLAN: 1.  Acute respiratory failure with shortness of breath, hypoxia. The patient desaturated to 86% on room air on minimal ambulation while in the ED, likely due to fluid overload. Will get 2-D echo. The patient will get hemodialysis today. The patient certainly could have pneumonia, underlying. Will start him on empiric Levaquin. This is concerning for progressive underlying lung cancer.  2.  Hyperkalemia. Should improve with hemodialysis. We will consult nephrology for dialysis need. He gets Monday, Wednesday, Friday dialysis.  3.  Possible congestive heart failure. Will obtain 2-D echo and decide further management as needed.  4.  Progressive lung cancer, now with some metastasis in bones and also probably in the lungs. Will consult  oncology, Dr. Oliva Bustard. Consider palliative care. Will consult after Dr. Oliva Bustard evaluates the patient. Discussed with Dr. Oliva Bustard.  5.  Possible T5 vertebral body fracture. We will consult orthopedics for further evaluation. This is likely pathological fracture. Unlikely surgical intervention. For now just pain management.  6.  End-stage renal disease, on hemodialysis Monday, Wednesday and Friday. We will consult nephrology.  7.  Anemia of chronic disease, stable.  8.  Hypertension. We will continue metoprolol.   CODE STATUS: FULL CODE.   TOTAL TIME TAKING CARE OF THIS PATIENT: 55 minutes.  ____________________________ Lucina Mellow. Manuella Ghazi, MD vss:sb D: 05/29/2013 09:30:25 ET T: 05/29/2013 09:53:41 ET JOB#: [id number]  cc: Arianni Gallego S. Manuella Ghazi, MD, <Dictator> Eduard Clos. Gilford Rile, MD Martie Lee. Oliva Bustard, MD Remer Macho MD ELECTRONICALLY SIGNED 06/02/2013 12:05

## 2014-11-12 NOTE — Consult Note (Signed)
Brief Consult Note: Diagnosis: T5 compression fracture.   Patient was seen by consultant.   Consult note dictated.   Recommend further assessment or treatment.   Comments: Asked to see patient based on abnormal appearance of T5 vertebrae on CT angiogram ordered to rule out PE.  Patient denies any back pain and had no tenderness over the thoracic spine to palpation today.  He denies any neurologic deficits.  The CT images show compression of the T5 vertebrae of 10-20%.  He has stage IIIB lung cancer.  There is concern that this may be a pathologic fracture due to metastatic disease.  Patient is followed by Dr. Oliva Bustard.  I have ordered an MRI of the lumbar spine to assess the T5 vertebrae further and determine the age of the compression deformity.  Since I am not a spine surgeon, I can not provide any interventional services to this patient.  If biopsy or treatment of compression fractures is indicated he will need to be sent to a spine surgeon for further managment.  I will follow up on the results of the MRI when available.  Recommend oncology consult.  Electronic Signatures: Thornton Park (MD)  (Signed [phone] 17:58)  Authored: Brief Consult Note   Last Updated: 07-Nov-14 17:58 by Thornton Park (MD)

## 2014-11-12 NOTE — Discharge Summary (Signed)
PATIENT NAME:  Gerald Cooke, Gerald Cooke MR#:  [id number] DATE OF BIRTH:  [birth year]  DATE OF ADMISSION:  05/29/2013 DATE OF DISCHARGE:  06/02/2013  PRIMARY CARE PHYSICIAN: Ronette Deter, MD  ADDENDUM: The patient's discharge was suspended due to the patient has hemoptysis. We repeated the patient's hemoglobin this morning. Hemoglobin is stable at 10.5. The patient has no complaints. Vital signs are stable. MRI of the spine did not show any cancer metastasis. The patient is clinically stable and will be discharged home. According to Dr. Oliva Bustard, the patient needs a PET scan, either as inpatient or outpatient. Since the patient is clinically stable, he will be discharged to home and follow up with Dr. Oliva Bustard and have PET as outpatient. I discussed the patient's discharge plan with the patient, nurse and case manager.   TIME SPENT: About 35 minutes.  ____________________________ Demetrios Loll, MD qc:sb D: 06/02/2013 13:11:11 ET T: 06/02/2013 14:18:10 ET JOB#: [id number]  cc: Demetrios Loll, MD, <Dictator> Demetrios Loll MD ELECTRONICALLY SIGNED 06/04/2013 16:04

## 2014-11-12 NOTE — Op Note (Signed)
PATIENT NAME:  Gerald Cooke, Gerald Cooke MR#:  [id number] DATE OF BIRTH:  [birth year]  DATE OF PROCEDURE:  08/26/2012  PREOPERATIVE DIAGNOSIS:  1.  End-stage renal disease.  2.  Complication dialysis device with poorly functioning dialysis access.   POSTOPERATIVE DIAGNOSIS:  1.  End-stage renal disease.  2.  Complication dialysis device with poorly functioning dialysis access.   PROCEDURES PERFORMED: 1.  Left upper extremity shuntogram.  2.  Percutaneous transluminal angioplasty of the midportion of the arteriovenous graft.  3.  Percutaneous transluminal angioplasty of the proximal axillary vein.   SURGEON:  Hortencia Pilar, M.D.   SEDATION:  Versed 4 mg plus fentanyl 150 mcg administered IV. Continuous ECG, pulse oximetry and cardiopulmonary monitoring is performed throughout the entire procedure by the interventional radiology nurse. Total sedation time was approximately 45 minutes.   ACCESS: 6 French sheath left arm brachial axillary dialysis graft.   CONTRAST USED:  Isovue 25 mL.   FLUOROSCOPY TIME: 1.5 minutes.   INDICATIONS: The patient is a 74 year old gentleman who has been having increasing problems at dialysis with cannulation and adequate dialysis runs.  The risks and benefits for angiography and intervention of his fistula were reviewed. All questions answered. The patient agrees to proceed.   DESCRIPTION OF PROCEDURE: The patient is taken to special procedures and placed in the supine position. After adequate sedation is achieved, the left arm is positioned and extended palm upward. The left arm is prepped and draped in sterile fashion. 1% lidocaine is infiltrated into the soft tissues overlying the access near the arterial anastomosis. Micropuncture needle followed by microwire, micro sheath followed by a J-wire and a 6 French sheath were then inserted. Hand injection of contrast is utilized to image the access.  Within the midportion of the access there is greater than 75% stenosis.  This is within the cannulation, the midportion of the cannulation zone. More proximally the image demonstrates previously placed stents and there is a narrowing at the distal margin of the stent extending into the proximal axillary vein. The subclavian vein is otherwise widely patent.  The innominate vein demonstrates a previously placed stent, which is widely patent. Superior vena cava is widely patent.   3000 units of heparin are given. Magic torque wire is then advanced, and first a 7 x 4 balloon is advanced across the 2 lesions, one in the midportion of the access one in the venous outflow.  The results at these 2 sites are inadequate and therefore an 8 x 6 balloon is advanced through. Angioplasty is performed for one minute. Two separate inflations are performed of these distinct lesions and follow-up angiography now demonstrates an excellent result and I do not feel that a stent is warranted based on this result. The pursestring suture of 4-0 Monocryl is placed. The sheath is pulled, pressure is held and there are no immediate complications.   INTERPRETATION: Initial views of the access demonstrate in the mid portion of the cannulation zone there is a greater than 75% stenosis. There is also more proximal stenosis in the axillary artery at the leading edge of the stent.  The previously placed central stent is widely patent.  Reflux imaging demonstrates arterial anastomosis is widely patent. Following angioplasty with first a 7 but then an 8 mm, there is an excellent result at both these locations.   SUMMARY: Successful salvage of left arm AV access as described.     ____________________________ Katha Cabal, MD ggs:ct D: 08/26/2012 20:39:07 ET T: 08/27/2012 07:47:17 ET JOB#:  [id number]  cc: Katha Cabal, MD, <Dictator> Katha Cabal MD ELECTRONICALLY SIGNED 09/08/2012 23:26

## 2014-11-12 NOTE — Consult Note (Signed)
patient had hemoptysis yesterday.  No chills.  No fever.  According to patient and hemoptysis was just takes of the blurred not very significant.scan of the spine has been reviewed.  Patient is afebrile.  Off oxygen. On dialysis.  No nausea.  No vomiting.is now being discharged todaysigns have been reviewed lungs: Crepitation and rhonchi on both sides.  Heart tachycardia Soft.extremity trace edema scan of lumbosacral spine does not reveal any evidence of metastatic diseasein the thoracic spines are consistent with radiation changesCT scan is difficult   to evaluatewith PET scan is done as inpatient or outpatient (called patient he patient is discharged I will arrange for that.)on the PET scan result further biopsy can be arrangedsupportive treatment and dialysis  Electronic Signatures: Jobe Gibbon (MD)  (Signed on 11-Nov-14 12:34)  Authored  Last Updated: 11-Nov-14 12:34 by Jobe Gibbon (MD)

## 2014-11-13 NOTE — Op Note (Signed)
PATIENT NAME:  Gerald Cooke, Gerald Cooke MR#:  [id number] DATE OF BIRTH:  [birth year]  DATE OF PROCEDURE:  12/16/2013  PREOPERATIVE DIAGNOSES:  1. Thrombosis of left arm brachioaxillary arteriovenous graft.  2. 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  3. End-stage renal disease requiring hemodialysis.   POSTOPERATIVE DIAGNOSES:  1. Thrombosis of left arm brachioaxillary arteriovenous graft.  2. 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  3. End-stage renal disease requiring hemodialysis.   PROCEDURES PERFORMED:  1. Contrast injection, left arm brachioaxillary dialysis graft.  2. Mechanical thrombectomy using both Trerotola and AngioJet of the left brachioaxillary dialysis graft and the central venous system.  3. Percutaneous transluminal angioplasty to 10 mm of the innominate and subclavian vein.  4. Exchange of dialysis catheter cuffed tunneled type over wire, same venous access, right femoral vein.   SURGEON: Katha Cabal, M.D.   SEDATION: Versed 5 mg plus fentanyl 200 mcg administered IV. Continuous ECG, pulse oximetry and cardiopulmonary monitoring is performed throughout the entire procedure by the interventional radiology nurse. Total sedation time was 1 hour and 50 minutes.   ACCESS:  1. A 6-French sheath left arm brachiocephalic fistula.  2. Right common femoral vein.   CONTRAST USED: Isovue 60 mL.   FLUOROSCOPY TIME: 9.2 minutes.   INDICATIONS: Mr. Gerald Cooke is a 74 year old gentleman who presented sent from an outside dialysis access center after they were unable to adequately treat his left arm AV graft. The risks and benefits were reviewed. All questions answered. The patient has agreed to proceed.   DESCRIPTION OF PROCEDURE: The patient is taken to special procedures and placed in the supine position. After adequate sedation is achieved, he is positioned supine with his left  arm extended palm upward. Left arm is prepped and draped in a sterile fashion. Appropriate timeout is called.   Lidocaine 1% is infiltrated in the soft tissues overlying the palpable brachioaxillary graft near the arterial anastomosis and in an antegrade direction, microneedle is inserted, microwire followed by micro sheath, J-wire followed by a 6-French sheath. Hand injection of contrast demonstrates thrombus within the graft. Angiomax 4.5 mL is then given as anticoagulation since the patient has heparin antibody. Floppy Glidewire and KMP catheter are then negotiated into the central venous system where thrombus is noted throughout the innominate and subclavian vein, essentially throughout the entire course of both veins.   Trerotola device is then utilized to make several passes, including within the central venous anatomy as well as the graft. There is a modest improvement, and it is elected to perform TPA thrombolysis with AngioJet for aspiration. The TPA is then reconstituted 8 mg in 50 mL and infused from the innominate all the way into the AV graft. It is allowed to dwell for approximately 30 minutes and then subsequently AngioJet is engaged. The proxy device is utilized. After multiple passes, there appears to be a stenosis and/or stricture at the leading edge of the stent as well as within the axillary subclavian confluence, but there is also significant thrombus still present. This does not appear to be responding well to either Trerotola thrombolysis or thrombolysis with AngioJet and Magic torque wire is introduced and a 10 x 6 Armada balloon is advanced over these 2 areas. Two separate inflations to 11 atmospheres for 1 minute are performed. Following this, hand injection of contrast does not show any significant improvement. Given this lack of ability to get the thrombus  removed, I did not think further attempts at salvaging this graft were warranted. Wire does pass easily and, therefore, this can  be converted to a HeRO graft, and this will be the future plan.   The right thigh is then prepped and draped in a sterile fashion. The existing catheter is nonfunctional and, therefore, it is removed and exchanged over an Amplatz Super Stiff wire for a 44 cm tip to cuff catheter which now has the tip just barely within the atrium. A significant improvement over the 33 cm catheter that was previously placed. Both lumens aspirate and flush easily and are then packed with a milligram of TPA per lumen. A sterile dressing is applied, and the catheter is secured to the skin of the thigh with 0 silk. The patient tolerated the procedure well, and there were no immediate complications.   INTERPRETATION: Initial views demonstrate that there is thrombus not only within the left arm brachioaxillary graft but extending into the axillary subclavian and innominate. Following attempts at thrombectomy, there is insufficient result. It did appear to be organized as it was not well treated with the Trerotola or with AngioJet and, therefore, angioplasty was performed. No significant wasting was noted and no significant improvement was identified and, therefore, further attempts at thrombectomy and salvage of this graft are abandoned, Thigh catheter was exchanged as noted above.   SUMMARY: Unsuccessful salvage of left arm brachioaxillary graft; however, I do believe that this graft can be converted and successfully salvaged using a HeRO type graft, and arrangements will be made for this. Femoral catheter exchanged over wire as described.   ____________________________ Katha Cabal, MD ggs:gb D: 12/16/2013 18:08:44 ET T: 12/17/2013 03:21:40 ET JOB#: [id number]  cc: Katha Cabal, MD, <Dictator> Munsoor Lilian Kapur, MD Katha Cabal MD ELECTRONICALLY SIGNED 12/22/2013 12:30

## 2014-11-13 NOTE — Consult Note (Signed)
Brief Consult Note: Diagnosis: melena, anemia, chest pain.   Patient was seen by consultant.   Comments: Gerald Cooke is a very pleasant caucasian male with hx CKD on dialysis, Stage IIIB lung cancer followed by Dr Oliva Bustard, anemia of chronic disease & intermittent GERD who presents with over 1 month hx of intermittent melena & chest pain.  Hgb has dropped from 10.5 to 8.2 in 2 months.  He has EKG changes & elevated troponins suggestive of cardiac disease & had ECHO this AM with cardiac cath pending today followed by Dr Fletcher Anon.  He is s/p colostomy during ruptured AAA repair at [facility] in 2008.  Differentials include PUD, gastritis or AVM.  Chest pain may also be due to advancing chest mass & tumor load.  Pt not stable for GI evaluation at this time.  If cardiology & oncology deem appropriate, we could consider EGD with Dr Allen Norris once stable.  In the interim, will change PPI to IV BID since he has nausea.  Continue antiemetics & add carafate.  Continue supportive measures.  Follow H/H.    Thanks for consult.  Please see full dictated note. #[id number].  Electronic Signatures: Andria Meuse (NP)  (Signed 30-Jan-15 10:56)  Authored: Brief Consult Note   Last Updated: 30-Jan-15 10:56 by Andria Meuse (NP)

## 2014-11-13 NOTE — Consult Note (Signed)
Pt seen and examined. Hx of lung ca with ESRD on HD. Recent hx of gastric ulcer that required cauterization. Some bleeding from colostomy bag. Pt anemic. Getting blood transfusion. Many possible causes of anemia, incl bleeding from femoral cath site. Pt already on protonix/carafate. Will consider only if bleeding from colostomy site continues. Will follow. Thanks.  Electronic Signatures: Verdie Shire (MD)  (Signed on 28-May-15 20:22)  Authored  Last Updated: 28-May-15 20:22 by Verdie Shire (MD)

## 2014-11-13 NOTE — Discharge Summary (Signed)
PATIENT NAME:  Gerald Cooke, Gerald Cooke MR#:  [id number] DATE OF BIRTH:  [birth year]   ADDENDUM: This is a continuation.  DATE OF ADMISSION:  01/05/2014.  DATE OF DISCHARGE:  01/18/2014.  DISCHARGE DIAGNOSES:  1.  Acute ogastrointestinal bleed, status post transfusion.  2.  Hypertension, now on midodrine.  3.  Acute respiratory failure secondary to take acute systolic heart failure.  4.  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 The patient had an IVC filter placed, not a candidate anticoagulation because of gastrointestinal bleed.  5.  Chronic pain.  6.  End-stage renal disease, on hemodialysis.  7.  Possible gastrointestinal malignancy; history of lung cancer.   DISCHARGE MEDICATIONS:  1.  Renvela 800 mg 2 tablets p.o. t.i.d.  2.  Combivent Respimat 1 puff inhalation b.i.d.  3.  Pantoprazole 40 mg p.o. b.i.d.  4.  Sucralfate 1 gram (po QID 6.  Fentanyl patch 12 mcg q.72 hours.  7.  Oxycodone 5 mg every 4-6 hours and as needed for pain.  8.  Colace 50 mg p.o. b.i.d.  9.  zofran 8 mg every 6 hours as needed for nausea.  10. Nepro supplement 1 can t.i.d.  11. Midodrine 10 mg p.o. t.i.d.  12. Lasix 40 mg p.o. b.i.d.   The patient now is on home oxygen 4 liters by nasal cannula. There was an interim discharge summary done by Dr. Manuella Ghazi on 24th of June, this is a continuation. The patient remained stable except that he had a Perma-Cath placed in the right IJ vein on June 26 by vascular and the patient is now on dialysis. He received a dialysis session on Friday, 26th of June and then he is having another hemodialysis session today on Monday 29th of June.  The patient still complains of some trouble breathing and he is on 4 liters of oxygen. His p.o. intake continues to be poor but he has been having poor p.o. intake and shortness of breath on a regular basis.  Please refer to Dr. Trena Platt interim discharge summary.  The patient remains stable otherwise and he is going to  [facility] and in the rest of the interim discharge summary was reviewed by me and also the labs were reviewed.   CODE STATUS:  Limited code.     PROGNOSIS:  Still very poor.  Going to [facility].    DISCHARGE VITAL SIGNS:  Temperature 98.5, blood pressure is around 90/58, oxygen saturation 100% on 4 liters.   TIME SPENT IN DISCHARGE PREPARATION: More than 30 minutes.    ____________________________ Epifanio Lesches, MD sk:lt D: 01/18/2014 13:37:23 ET T: 01/18/2014 14:35:39 ET JOB#: [id number]  cc: Epifanio Lesches, MD, <Dictator> Epifanio Lesches MD ELECTRONICALLY SIGNED 02/04/2014 19:48

## 2014-11-13 NOTE — Consult Note (Signed)
Chief Complaint:  Subjective/Chief Complaint Pt in dialysis. Feels better today. Stronger after blood transfusion.Eating ok. No further bleeding through colostomy.   VITAL SIGNS/ANCILLARY NOTES: **Vital Signs.:   29-May-15 07:50  Vital Signs Type Routine  Temperature Temperature (F) 97.8  Celsius 36.5  Temperature Source oral  Pulse Pulse 98  Respirations Respirations 18  Systolic BP Systolic BP 706  Diastolic BP (mmHg) Diastolic BP (mmHg) 74  Mean BP 94  Pulse Ox % Pulse Ox % 100  Pulse Ox Activity Level  At rest  Oxygen Delivery Room Air/ 21 %   Brief Assessment:  GEN no acute distress   Cardiac Regular   Respiratory clear BS   Gastrointestinal Normal  Fresh colostomy bag that is empty. No blood present   EXTR Bloody dressing at femoral cath site.   Lab Results:  Thyroid:  29-May-15 05:18   Thyroid Stimulating Hormone  6.28 (0.45-4.50 (International Unit)  ----------------------- Pregnant patients have  different reference  ranges for TSH:  - - - - - - - - - -  Pregnant, first trimetser:  0.36 - 2.50 uIU/mL)  Routine Chem:  29-May-15 05:18   Glucose, Serum 82  BUN  81  Creatinine (comp)  6.28  Sodium, Serum 138  Potassium, Serum 4.3  Chloride, Serum 106  CO2, Serum 24  Calcium (Total), Serum  8.3  Anion Gap 8  Osmolality (calc) 299  eGFR (African American)  9  eGFR (Non-African American)  8 (eGFR values <28m/min/1.73 m2 may be an indication of chronic kidney disease (CKD). Calculated eGFR is useful in patients with stable renal function. The eGFR calculation will not be reliable in acutely ill patients when serum creatinine is changing rapidly. It is not useful in  patients on dialysis. The eGFR calculation may not be applicable to patients at the low and high extremes of body sizes, pregnant women, and vegetarians.)  Magnesium, Serum 1.8 (1.8-2.4 THERAPEUTIC RANGE: 4-7 mg/dL TOXIC: > 10 mg/dL  -----------------------)  Routine Hem:  29-May-15  05:18   WBC (CBC) 10.4  RBC (CBC)  2.58  Hemoglobin (CBC)  7.4  Hematocrit (CBC)  22.9  Platelet Count (CBC) 167  MCV 89  MCH 28.7  MCHC 32.4  RDW  17.3  Neutrophil % 83.7  Lymphocyte % 4.9  Monocyte % 9.3  Eosinophil % 1.8  Basophil % 0.3  Neutrophil #  8.7  Lymphocyte #  0.5  Monocyte # 1.0  Eosinophil # 0.2  Basophil # 0.0 (Result(s) reported on 18 Dec 2013 at 06:41AM.)   Assessment/Plan:  Assessment/Plan:  Assessment Anemia. Bleeding at femoral cath site continues. No bleeding from colostomy site.   Plan Continue protonix and carafate due to hx of gastric ulcer. Since there is no active GI bleeding, will not pursue EGD at this time. Will sign off. If GI condition changes, then call [facility]. Thanks.   Electronic Signatures: OVerdie Shire(MD)  (Signed 29-May-15 11:51)  Authored: Chief Complaint, VITAL SIGNS/ANCILLARY NOTES, Brief Assessment, Lab Results, Assessment/Plan   Last Updated: 29-May-15 11:51 by OVerdie Shire(MD)

## 2014-11-13 NOTE — Consult Note (Signed)
PATIENT NAME:  Gerald Cooke, Gerald Cooke MR#:  [id number] DATE OF BIRTH:  1940/11/26  DATE OF CONSULTATION:  01/07/2014  REFERRING PHYSICIAN:   CONSULTING PHYSICIAN:  Payton Emerald, NP  ATTENDING:  Dr. Earleen Newport.  CONSULTING DEPARTMENT:  For [name] Payton Emerald, NP and Dr. Verdie Shire.   REASON FOR CONSULTATION: Anemia.   HISTORY OF PRESENT ILLNESS: Gerald Cooke is a very pleasant 74 year old Caucasian gentleman with multiple complex medical history.  History of end-stage renal disease, peptic ulcer disease, chronic anemia, lung cancer under the care of Dr. Oliva Bustard, hypertension, hypercholesteremia, ruptured abdominal aortic aneurysm in 2008, subsequent colostomy, and reflux. Gerald Cooke was seen in consultation by Dr. Verdie Shire, 12/18/2013, when he was hospitalized for anemia at that time. There was some question as to the underlying etiological cause of the anemia. Possibly not related to a GI cause. In reviewing records over the past couple of months, there has been documentation of evidence of blood being noted in his colostomy bag. On admission, his hemoglobin was 7.1, which dropped to 6.0 to 8.5 then up to 9.0, 7.5, and currently 9.1 after blood transfusion. He has been complaining for the past month of generalized weakness, associated nausea, no vomiting. Unable to elaborate the amount of weight which he has lost. Significant for heartburn, as well as black, tarry stools. He does state that he had an upper endoscopy performed by Dr. Paulita Fujita about a month ago at [facility], was advised that he had a gastric ulcer at that time. He has not had a colonoscopy done since 2005 and was performed by Dr. Hervey Ard. At that time, he had a polyp resected, but unable to locate  pathology report.   PAST MEDICAL AND SURGICAL HISTORY: End-stage renal disease on hemodialysis, peptic ulcer disease, chronic esophagitis, chronic GI bleed, chronic anemia secondary to slow GI bleed felt to be as well related to chronic renal  failure, history of lung cancer status post radiation and chemotherapy under the care of Dr. Oliva Bustard, hypertension, hypercholesteremia, ruptured abdominal aortic aneurysm in 2008 and subsequent colostomy, hyperlipidemia, GERD, severe aortic stenosis, history of coronary artery disease, status post non-Q-wave myocardial infarction.   ALLERGIES: HEPARIN AND TETANUS.   FAMILY HISTORY: From old records, diabetes, hypertension, and coronary artery disease.   SOCIAL HISTORY: Resides at [facility]. No tobacco. No alcohol use.   HOME MEDICATIONS: On admission, Carafate 2 grams b.i.d., Renvela 800 mg 3 tablets 3 times a day, Protonix 40 mg twice a day, oxycodone 5 mg 1 tablet every 4-6 hours as needed,  Zofran 8 mg every 6 hours as needed, fentanyl patch 12 mcg 1 patch every 72 hours, DuoNeb as needed every 6 hours, Combivent 1 puff b.i.d., and Colace 15 mg twice a day.   REVIEW OF SYSTEMS:  CONSTITUTIONAL: No fevers. Significant for fatigue and weakness.  EYES: No blurred vision or double vision.  EARS, NOSE AND THROAT: No tinnitus, ear pain, hearing loss, or discharge. RESPIRATORY: Significant for shortness of breath and history of lung cancer.  GASTROINTESTINAL: See history of present illness.  Significant for abdominal pain, generalized constant pain for the past month. Not resolved with defecation.  GENITOURINARY: No dysuria or hematuria.  ENDOCRINE: No polyuria or nocturia.  HEMATOLOGIC: History of chronic anemia. Denies significant easy bruising and bleeding.  SKIN: No rashes. No lesions.  MUSCULOSKELETAL: Significant for arthritis. No history of gout.  NEUROLOGICAL: No history of CVA or TIA or seizure disorder.  PSYCHIATRIC: Significant for depression. No  anxiety.   PHYSICAL EXAMINATION:  VITAL SIGNS: Temperature is 97.8 with a pulse of 85, respirations are 16, blood pressure is 80/62 and pulse oximetry is 98%.  GENERAL: Well-developed, thin, 74 year old Caucasian gentleman,  resting comfortably in bed. No acute distress noted.  HEENT: Normocephalic, atraumatic. Pupils equal and reactive to light.  NECK: Supple. Trachea midline. No lymphadenopathy or thyromegaly.  PULMONARY: Decreased breath sounds noted throughout, auscultated anteriorly. No wheezing.  CARDIOVASCULAR: Regular rhythm, S1 and S2, 3/6 systolic murmur.  ABDOMEN: Soft, nondistended. Hyperactive bowel sounds. Colostomy present with a very scant amount of melanotic stool, as noted on H and P to be strongly heme-positive. RECTAL: Deferred.  EXTREMITIES: No edema.  PSYCHIATRIC: Alert and oriented x 4. Flat affect. Depressive mood.  NEUROLOGICAL: No gross neurological deficits.   LABORATORY AND DIAGNOSTIC DATA:  All laboratory studies during this admission reviewed by myself. Creatinine was noted to be elevated at 5.94 on initial admission, which has improved to 4.06, today's date. Hemoglobin on admission, again, was 7.1, trending noted under history and 9.1 at this time. The patient has received 2 units of packed red blood cells on June 16. Hepatitis B service antibody is reactive. Hepatitis B surface antigen is negative. Hepatitis B core antibody IgM is negative.   CT scan of abdomen and pelvis without contrast was done on the 16th of June, which revealed new small bilateral pleural effusions, stable calcified pleural plaques suggestive of asbestos related pulmonary disease, increasing abdominal pelvic ascites. Stomach did demonstrate mild diffuse wall thickening, which could be inflammation or edema.   Chest x-ray, single view, on the 16th revealed diffuse interstitial opacities again, which could reflect edema or infectious process similar in appearance of right upper lobe volume loss consolidation, essential cavitation.   Ultrasound Doppler lower right extremity revealed extensive occlusive femoral-popliteal deep vein thrombosis.   IMPRESSION:  1.  History of chronic anemia, suspect to be related to upper  gastrointestinal source, according to patient diagnosed with gastric ulcer a month ago.  2.  History of end-stage renal disease requiring  dialysis.  3.  Lung cancer, under the care of Dr. Oliva Bustard.  4.  Newly diagnosed with deep vein thrombosis.  5.  Melena with evidence of heme-positive stool, guaiac positive on admission.   PLAN: The patient's presentation will be discussed with Dr. Verdie Shire. Addendum to follow. We will proceed forward though with patient to sign medical release form to obtain endoscopy findings from a month ago to assess size of the ulcer at that time.  Consultation in collaboration with Dr. Verdie Shire.   ____________________________ Payton Emerald, NP dsh:ts D: 01/07/2014 13:10:49 ET T: 01/07/2014 14:58:35 ET JOB#: [id number]  cc: Payton Emerald, NP, <Dictator> Payton Emerald MD ELECTRONICALLY SIGNED 01/07/2014 17:23

## 2014-11-13 NOTE — H&P (Signed)
PATIENT NAME:  Gerald Cooke, Gerald Cooke DATE OF BIRTH:  03-27-1941  DATE OF ADMISSION:  08/21/2013  REFERRING PHYSICIAN:  Dr. [name].   PRIMARY CARE PHYSICIAN:  Dr. Ronette Deter.   CARDIOLOGIST:  Dr. Rockey Situ.   ONCOLOGIST:  Dr. Oliva Bustard.   NEPHROLOGIST:  Dr. Holley Raring.   HISTORY OF PRESENT ILLNESS:  This is a 74 year old Caucasian gentleman with past medical history of end-stage renal disease on hemodialysis as well as stage IIIB lung cancer, followed by Dr. Oliva Bustard who is presenting with chest pain.  He is describing one day duration of chest pain which is retrosternal in location, radiates across the chest bilaterally.  States that it is a burning quality, intensity ranging between 6 to 7 out of 10, however this lasted for 1 to 2 hours before subsiding.  He denies any associated shortness of breath, palpitations, fevers, chills, cough, nausea, vomiting, diaphoresis.  On arrival to the Emergency Department, his chest pain has decreased, but not subsided.  He was given a GI cocktail with complete resolution of symptoms.  Also given aspirin.  On initial work-up he was found to have significant EKG changes consistent of ST depressions in the lateral leads greater than 3 mm and noted elevated troponin of 0.1.  Of note, he is a hemodialysis patient and did miss dialysis today, though does not have a history of elevated troponins.  Currently, he is without complaints.   REVIEW OF SYSTEMS:  CONSTITUTIONAL:  Denies fever, fatigue, weakness.  EYES:  Denies blurred vision, double vision, eye pain.  EARS, NOSE, THROAT:  Denies tinnitus, ear pain, hearing loss.  RESPIRATORY:  Denies cough, wheeze, shortness of breath.  CARDIOVASCULAR:  Positive for chest pain as described above.  Denies any orthopnea, edema or palpitations.  GASTROINTESTINAL:  Denies nausea, vomiting, diarrhea, abdominal pain, hematemesis, melena.  GENITOURINARY:  Denies dysuria, hematuria.  ENDOCRINE:  Denies nocturia or thyroid  problems.  HEMATOLOGIC AND LYMPHATIC:  Denies easy bruising or bleeding.  SKIN:  Denies rash or lesions.  MUSCULOSKELETAL:  Denies pain in neck, back, shoulder, knees, hips, arthritic symptoms.  NEUROLOGIC:  Denies paralysis, paresthesias.  PSYCHIATRIC:  Denies anxiety or depressive symptoms.  Otherwise, full review of systems performed by me is negative.   PAST MEDICAL HISTORY:  End-stage renal disease on hemodialysis Monday, Wednesday, Friday, stage IIIB lung cancer status post chemotherapy and radiation, hypertension, history of ruptured AAA, gastroesophageal reflux disease, hyperlipidemia.   SOCIAL HISTORY:  Remote tobacco use.  Denies any alcohol or drug usage.   FAMILY HISTORY:  Positive for diabetes, hypertension.  He also has a sister and son who have myocardial infarctions and unfortunately died in their 41s.   ALLERGIES:  HEPARIN, WHICH HE STATES CAUSES SEVERE HYPOTENSION AS WELL AS ALLERGIES TO TETANUS TOXOID WHICH CAUSES ARM SWELLING.   PHYSICAL EXAMINATION: VITAL SIGNS:  Afebrile, blood pressure 123/70, heart rate 94, saturating in the mid-90s on room air.  GENERAL:  Well-nourished, well-developed, Caucasian gentleman, currently in no acute distress.  HEAD:  Normocephalic, atraumatic.  EYES:  Pupils equal, round, reactive to light.  Extraocular muscles intact.  No scleral icterus.  MOUTH:  Moist mucosal membranes.  Dentition intact.  No abscess noted.  EARS, NOSE, THROAT:  Throat clear without exudates.  No external lesions.  NECK:  Supple.  No thyromegaly.  No nodules.  No JVD.  PULMONARY:  Clear to auscultation bilaterally without wheezes, rubs or rhonchi.  No use of accessory muscles.  Good respiratory effort.  CHEST:  Nontender  to palpation.  CARDIOVASCULAR:  S1, S2, regular rate and rhythm with a 4 out of 6 systolic ejection murmur best heard at right upper sternal border.  No edema.  Pedal pulses 2+ bilaterally.  GASTROINTESTINAL:  Soft, nontender, nondistended.  No  masses.  Positive bowel sounds.  No hepatosplenomegaly.  MUSCULOSKELETAL:  No swelling, clubbing or edema.  Range of motion full in all extremities.  NEUROLOGICAL:  Cranial nerves II through XII are intact.  No gross focal neurological deficits.  Sensation intact.  Reflexes intact.  SKIN:  No ulcerations, lesions, rashes or cyanosis.  Skin warm, dry.  Turgor intact.  PSYCHIATRIC:  Mood and affect within normal limits.  The patient is awake, alert, oriented x 3.  Insight and judgment intact.   LABORATORY DATA:  Current laboratory system is down, however BUN of 53, creatinine of 5.28, hemoglobin of 8.2, note that it was 10.5 back in November, MCV of 103 with an RDW of 14.5, troponin I of 0.1.  EKG performed revealing bifascicular block with 3 to 5 mm ST depression in the lateral leads which is changed from previous examination.   ASSESSMENT AND PLAN:  A 74 year old gentleman with history of lung cancer, end-stage renal disease on hemodialysis, presenting with chest pain, found to have elevated troponin and EKG changes.   1.  Non-ST-elevation myocardial infarction.  He has been given aspirin.  We will trend cardiac enzymes.  Admit to telemetry.  Consult his cardiologist, Dr. Rockey Situ.  The case was discussed with on call cardiologist Dr Stanford Breed about the addition of heparin or not with concern of his allergy.  We are limited in the options as far as anticoagulation.  As he is asymptomatic at this time, we will trend cardiac enzymes and reassess from that standpoint.  2.  End-stage renal disease on hemodialysis.  We will consult nephrology for continuation of dialysis.  Of note, he did miss dialysis today.  He has no need for emergent dialysis at this time.  3.  Gastroesophageal reflux disease.  Continue with PPI therapy, has received GI cocktail with relief of symptoms.  4.  Hypertension.  We will review home medications and continue those medications.  5.  Stage IIIB lung cancer.  6.  VTE prophylaxis with  SCDs. 7.  CODE STATUS:  THE PATIENT IS FULL CODE.   TIME SPENT:  45 minutes.    ____________________________ Aaron Mose. Hower, MD dkh:ea D: 08/21/2013 02:55:12 ET T: 08/21/2013 03:30:52 ET JOB#: [id number]  cc: Aaron Mose. Hower, MD, <Dictator> DAVID Woodfin Ganja MD ELECTRONICALLY SIGNED 08/23/2013 20:31

## 2014-11-13 NOTE — Consult Note (Signed)
History of Present Illness:  Reason for Consult Stage IIIb Carcinoma lung with severe anemia S/p Chemo with Taxotere/Ramicurumab 1 week ago.   HPI   Patient is here for further evaluation and treatment consideration regarding carcinoma of lung.  He states is feeling very weak and tired past 2 days. Dialysis access in left arm clotted over the weekend and temp dialysis cath was placed in right groin 12/15/13. States dressing was saturated with blood this morning and continues to bleed. He also reports some blood noted in his stool via colostomy. He is extremely weak and has no strength to even walk across the room. admitted via hospitalist service and s/p 1 unit packed red cell transfusion.  PFSH:  Additional Past Medical and Surgical History Past medical history: Renal failure requiring dialysis, Hypertension.  Past surgical history: Ruptured aortic aneurysm repair requiring nephrectomy, partial bowel resection status post colostomy.  Family history: Negative and noncontributory.  Social history: Remote history of tobacco use, denies alcohol.   Review of Systems:  General weakness  fatigue   Performance Status (ECOG) 2   HEENT no complaints   Lungs cough  SOB   Cardiac no complaints   GI per HPI   GU no complaints   Musculoskeletal no complaints   Extremities swelling   Neuro no complaints   Endocrine no complaints   Psych anxiety   Review of Systems   Bleeding from femoral access site  NURSING NOTES: **Vital Signs.:   29-May-15 04:17   Vital Signs Type: Routine   Temperature Temperature (F): 97.9   Celsius: 36.6   Temperature Source: oral   Pulse Pulse: 83   Respirations Respirations: 18   Systolic BP Systolic BP: 132   Diastolic BP (mmHg) Diastolic BP (mmHg): 75   Mean BP: 92   Pulse Ox % Pulse Ox %: 97   Pulse Ox Activity Level: At rest   Oxygen Delivery: Room Air/ 21 %   Physical Exam:  General Patient appears weak and fatigued. No acute  distress   HEENT: normal   Lungs: clear   Cardiac: regular rate, rhythm   Breast: not examined   Abdomen: soft  nontender   Skin: still ozzing from fem cath   Extremities: No edema, rash or cyanosis   Neuro: AAOx3   Psych: normal appearance    Tetanus /Diphtheria Toxoid: Other  Heparin: Other  Assessment and Plan: Impression:   1. Carcinoma of lung, stage IIIB. 2. Chronic renal failure.Aortic Stenosis.Gastric ulcers.  Plan:   1. Carcinoma of lung, stage IIIb. Patient developed ascites that was drained and reported as positive for malignant cells. Patient was recently started on Taxotere and Cyramza on Dec 10, 2013. Chronic renal failure. Patient remains on hemodialysis on MWF schedule. AV fistula clotted this week. Has since had a temporary femoral line inserted. He reports bleeding since insertion that saturates the dressings. Hgb has significantly dropped. Anemia. Hgb 6.9, decreased significantly. Likely multifactorial in regards to renal failure, acute blood loss, recent gastric ulcers and chemotherapy side effect. May benefit from 2nd unit packed red cells.Aortic Stenosis. Has been recommended to have an aortic valve replacement. This would then require life long anticoagulation. Would hold off for now as multiple other issues.Gastric ulcers/ bleeding. Patient has also noticed recurrent bleeding from colostomy over the last several days.  Appreciate hopsitalist, GI and Renal input.  Electronic Signatures: Georges Mouse (MD)  (Signed 29-May-15 13:41)  Authored: HISTORY OF PRESENT ILLNESS, PFSH, ROS, NURSING NOTES, PE, ALLERGIES, ASSESSMENT AND  PLAN   Last Updated: 29-May-15 13:41 by Georges Mouse (MD)

## 2014-11-13 NOTE — H&P (Signed)
PATIENT NAME:  Gerald, Cooke MR#:  [id number] DATE OF BIRTH:  03-27-1941  DATE OF ADMISSION:  11/30/2013  PRIMARY CARE PHYSICIAN: Ronette Deter, MD  REFERRING EMERGENCY ROOM PHYSICIAN: Marjean Donna, MD  PRIMARY CARDIOLOGIST: Ida Rogue, MD  ONCOLOGIST: Forest Gleason, MD  NEPHROLOGIST: Anthonette Legato, MD  CHIEF COMPLAINT: Abdominal pain.   HISTORY OF PRESENT ILLNESS: The patient is a 74 year old pleasant Caucasian male with past medical history of end-stage renal disease on hemodialysis on Monday, Wednesday, and Friday, stage III lung cancer with metastasis status post radiation and chemotherapy in the past, followed by Dr. Oliva Bustard. He is presenting to the ER with the chief complaint of generalized acute abdominal pain for the past 3 days. He denies any nausea, vomiting, diarrhea. He feels constipated. Last BM was today while he was in the ER. No fevers. No sick contacts. Denies any hematemesis or melena. Denies any blood in his stool. As the pain is getting worse, the patient came into the ER. CAT scan of the abdomen and pelvis without contrast was done which has revealed no evidence of bowel obstruction. ER physician has discussed with the on-call surgeon, Dr. Leanora Cover, who has recommended to admit the patient to medical services and consult surgery. Hospitalist team is called to admit the patient. During my examination, the patient was feeling better as he was given pain medicine for his abdominal pain. No family members at bedside. He reports that his wife is in a nursing home and currently he is living with stepson. The patient is feeling down because of his current situation and multiple medical problems. The patient was just recently admitted to the hospital on February 27th for melena, which was suspected to be due to radiation.   PAST MEDICAL HISTORY:  1.  End-stage renal disease on hemodialysis.  2.  Coronary artery disease status post cardiac catheterization and diagnosed with  non-ST MI on              January 30th with critical aortic stenosis. 3.  Stage III lung cancer status post chemotherapy and radiation. 4.  Hypertension. 5.  Ruptured abdominal aortic aneurysm in the year 2008. 6.  GERD. 7.  Hyperlipidemia. 8.  Radiation esophagitis.  9.  Anemia of chronic disease.  PAST SURGICAL HISTORY:  1.  Cardiac catheterization. 2.  Recent EGD and diagnosed with radiation esophagitis. 3.  Repair of ruptured abdominal aortic aneurysm.  ALLERGIES: HEPARIN, TETANUS DIPHTHERIA TOXOID CAUSES ARM SWELLING, HEPARIN CAUSES SEVERE HYPOTENSION.   PSYCHOSOCIAL HISTORY: Lives at home with his stepson. No history of smoking, alcohol or illicit drug usage. His wife is currently in a nursing home.   FAMILY HISTORY: Significant for diabetes, hypertension, coronary artery disease.  HOME MEDICATIONS:  1.  Carafate 2 grams p.o. 2 times a day. 2.  Renvela 800 mg 3 tablets p.o. 3 times a day. 3.  Pantoprazole 40 mg 1 tablet p.o. 2 times a day. 4.  Zofran 8 mg 1 tablet p.o. 2 times a day. 5.  Metoprolol 25 mg 1 tablet p.o. once a day. 6.  Combivent 1 puff inhalation 2 times a day.  REVIEW OF SYSTEMS: CONSTITUTIONAL: Denies any fever or fatigue, weight gain or weight loss. Denies any fever or chills.  EYES: Denies blurry vision, double vision, inflammation, glaucoma.  ENT: Denies epistaxis, ear pain, hearing loss or discharge.  RESPIRATORY: Denies cough, COPD, hemoptysis or dyspnea.  CARDIOVASCULAR: Denies any chest pain. Denies any palpitations, syncope.  GASTROINTESTINAL: Denies nausea. Complaining of generalized abdominal pain  with no radiation, 8 out of 10. Denies any melena or hematemesis. Feels constipated.  GENITOURINARY: No dysuria, hematuria or renal colic.  ENDOCRINE: Denies polyuria, nocturia, heat or cold intolerance. HEMATOLOGIC AND LYMPHATIC: No anemia, easy bruising or bleeding.  INTEGUMENTARY: No acne, rash, lesions.  MUSCULOSKELETAL: Denies any swelling, gout,  cramps.  NEUROLOGIC: Denies any history of CVA, TIA, ataxia, tremors.  PSYCHIATRIC: Denies any anxiety. Feeling depressed. Denies any insomnia.  PHYSICAL EXAMINATION: VITAL SIGNS: Temperature 98.9, pulse 98, respirations 14, blood pressure 122/76, pulse ox 96% on 2 liters.  GENERAL APPEARANCE: Not in any acute distress, moderately built and nourished. HEENT: Normocephalic, atraumatic. Pupils are equally reacting to light and accommodation. No scleral icterus. No conjunctival injection. No sinus tenderness. Extraocular movements are intact. Moist mucous membranes.  NECK: Supple. No JVD or thyromegaly. Range of motion is intact.  LUNGS: Clear to auscultation bilaterally. No accessory muscle usage. No anterior chest wall tenderness on palpation.  CARDIAC: S1 and S2 normal. Regular rate and rhythm. Positive ejection systolic murmur.  GASTROINTESTINAL: Soft. Bowel sounds are positive in all 4 quadrants. Generalized tenderness is present. No rebound tenderness. No masses felt. Colostomy bag is empty and intact. NEUROLOGIC: Awake, alert, and oriented x3. Motor and sensory grossly intact. Reflexes are 2+.  EXTREMITIES: No edema. No cyanosis. No clubbing.  SKIN: Warm to touch. Normal turgor. No rashes. No lesions.  MUSCULOSKELETAL: No joint effusion, tenderness, erythema. PSYCH: Flat mood and affect. Feeling down.  DIAGNOSTIC DATA: CAT scan of the abdomen and pelvis without contrast has revealed no evidence of bowel obstruction. Mild diffuse abdominal and pelvic ascites with nodular infiltration in the omentum. peritoneal lymphadenopathy suggesting possible metastatic disease. Small inguinal hernias. Small midline abdominal wall hernia containing small bowel. Bilateral renal atrophy.   LFTs are normal, except albumin of 2.5. CPK-MB 2.5, repeat is 2.5. Troponin I 1.40 x2. WBC 9.9, hemoglobin 10, hematocrit 32.2, platelets 266,000, MCV 91. Urinalysis: Yellow in color, clear in appearance. Bilirubin and  ketones are negative. Nitrites are negative. Leukocyte esterase is negative. Chem-8: Glucose is 86, BUN 57, creatinine 8.35, sodium 135, potassium 4.8, chloride 101, CO2 26, GFR 6, anion gap 8. Serum osmolality, calcium, and lipase are normal. The patient's troponin is chronically elevated which was at 0.32 to 0.52 range in March 2013. January 30th 2015 the troponin went up to 1.20. Today it is at 1.40 x 2.   A 12-lead EKG has revealed sinus tachycardia at 94 beats per minute, right bundle branch block, left anterior vesicular block, left ventricular hypertrophy. No acute ST-T wave changes.   ASSESSMENT AND PLAN: A 74 year old Caucasian male presenting to the ER with the chief complaint of generalized abdominal pain for the past 3 days associated with constipation, no fevers and no sick contacts, will be admitted with the following assessment and plan:  1.  Acute generalized abdominal pain, etiology unclear, probably from mets to intestine. We will keep him n.p.o., provide IV fluids. Will provide gastrointestinal prophylaxis with Pepcid as currently Protonix is not available in our pharmacy, back logged. Pain management with morphine IV as needed. Surgical consult is placed with Dr. Leanora Cover.  2.  Acute on chronic elevated troponin. The patient denies any chest pain or shortness of breath at this time. Will cycle cardiac biomarkers and if necessary we will put in a consult to cardiology, Dr. Rockey Situ.  3.  Endstage renal disease. The patient gets hemodialysis on Monday, Wednesday, and Friday. Nephrology consult is placed regarding Monday's hemodialysis.  4.  Metastatic  stage III lung cancer, status post chemotherapy and radiation therapy. Follows with Dr. Inez Pilgrim as an outpatient. Oncology consult is placed to Dr. Inez Pilgrim. We will put a consult to palliative care as well. 5.  Severe aortic valve stenosis. The patient is reporting that he is scheduled to get surgery once he is clinically stable for valve  replacement. We will provide gastrointestinal prophylaxis with Pepcid, deep vein thrombosis prophylaxis with SCDs only. The patient is allergic to heparin, which causes severe hypotension.   He is FULL code. Plan of care was discussed with the patient. He is aware of the plan.   TOTAL TIME SPENT ON ADMISSION: 50 minutes.  ____________________________ Nicholes Mango, MD ag:sb D: 11/30/2013 05:34:58 ET T: 11/30/2013 07:10:15 ET JOB#: [id number]  cc: Nicholes Mango, MD, <Dictator> Eduard Clos. Gilford Rile, MD Martie Lee. Oliva Bustard, MD  Nicholes Mango MD ELECTRONICALLY SIGNED 12/15/2013 1:43

## 2014-11-13 NOTE — Consult Note (Signed)
Brief Consult Note: Diagnosis: Melena, heme positive stool.  History of chronic anemia. Chronic renal disease.  Lung cancer.  Generalized abdominal pain.   Consult note dictated.   Discussed with Attending MD.   Comments: Patient's presentation will be discussed with Dr. Verdie Shire. Medical release to be signed to obtain prior endoscopy findings from [facility].  Addendum to follow once I have spoken with Dr. Candace Cruise.  Addendum:  Spoke with Dr. Candace Cruise.  At this time in agreement to obtain EGD records from [facility]  Once reviewed decision will be made on whether to proceed with endoscopy evaluation.  At this time continued monitoring and transfuse as necessary.  Recommend proceeding with PPI therapy based on current GI symptoms and history of ulcer disease as well as evidence of melena and heme positive stool..  Electronic Signatures: Payton Emerald (NP)  (Signed 18-Jun-15 13:28)  Authored: Brief Consult Note   Last Updated: 18-Jun-15 13:28 by Payton Emerald (NP)

## 2014-11-13 NOTE — Op Note (Signed)
PATIENT NAME:  Gerald Cooke, Gerald Cooke MR#:  [id number] DATE OF BIRTH:  1940-08-14  DATE OF PROCEDURE:  01/06/2014  PREOPERATIVE DIAGNOSES:  1. Right lower extremity deep venous thrombosis.  2. End-stage renal disease requiring hemodialysis.  3. Complication arteriovenous dialysis device.  4. 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   PROCEDURES PERFORMED: 1. Insertion of suprarenal inferior vena caval filter, Meridian type.  2. Inferior venacavogram.  3. Removal of cuffed, tunneled right femoral Trialysis catheter.  4. Insertion of triple-lumen temporary dialysis catheter, right IJ, same venous access.   SURGEON: Dr. Delana Meyer   SEDATION :Versed 1 mg plus fentanyl 25 mcg administered IV. Continuous ECG, pulse oximetry, and cardiopulmonary monitoring is performed throughout the entire procedure by the interventional radiology nurse. Total sedation time was 50 minutes.   ACCESS: Right IJ, 9-French sheath.   CONTRAST USED: Isovue 70 mL.   FLUOROSCOPY TIME: 6.5 minutes.   INDICATIONS:  This patient is a 74 year old gentleman who presented to the hospital with profound anemia and hemoglobin of less than 6. He was also noted to have swelling of the right leg. Duplex ultrasound demonstrated. Common femoral deep venous thrombosis and he is therefore undergoing removal of the dialysis catheter from the right common femoral vein and insertion of an IVC filter as he will not tolerate anticoagulation given his anemia and his GI bleeding. Risks and benefits were explained to the patient in detail. All questions answered. The patient has agreed to proceed.   DESCRIPTION OF PROCEDURE: The patient is taken to special procedures and placed in the supine position. After adequate sedation is achieved, his right groin is prepped and draped in a sterile fashion. Lidocaine 1% with epinephrine is then infiltrated in and around the tunnel catheter itself, and subsequently, the catheter is removed after  freeing the cuff using a hemostat. Pressure is held. Sterile dressing is applied. The attention is then turned to the right neck which is prepped and draped in a sterile fashion. Ultrasound is placed in a sterile sleeve. The jugular vein is identified as echolucent and compressible indicating patency. Image is recorded for the permanent record. Under real-time visualization, the Seldinger needle is inserted into the jugular vein. J-wire is then advanced, 5-French sheath and Kumpe catheter as well as a floppy Glidewire are used to negotiate the wire and catheter around the existing stent noted in the innominate vein. The catheter and wire combination is then negotiated into the inferior vena cava and the delivery sheath for the IVC filter is advanced over the wire and positioned with its tip at the level of the iliac confluence. Bolus injection of contrast is then utilized and demonstrates that there is thrombus throughout the entire infrarenal inferior vena cava. The catheter is then repositioned to the hepatic level and followup imaging demonstrates there is thrombus at the hepatic level which extends almost to the diaphragm. There does appear to be a space of 2 cm that would allow for a filter to expand adequately and it is selected to place the filter at this level. There is absolutely no feasibility to anticoagulation in the patient, and even if there is some thrombus at the level of the filter, it will still be protecting him from the mass of thrombus burden noted inferiorly. The Meridian filter is then deployed. Initially, it is deployed into an area where it does not expand well. It is then captured with a snare and a 2nd filter is deployed slightly higher where it expands to  an adequate size.   The J-wire is then reintroduced.  It is actually negotiated down into a hepatic vein above the filter, and subsequently, the triple-lumen temporary dialysis catheter is advanced over the wire after the delivery  sheath has been removed. All 3 lumens aspirate and flush easily, and the catheter is secured to the skin of the neck with 2-0 nylon and a sterile dressing is applied. The patient tolerated the procedure well and was taken back to the intensive care unit in critical but unchanged condition.    ____________________________ Katha Cabal, MD ggs:dd D: 01/07/2014 19:50:00 ET T: 01/08/2014 05:17:37 ET JOB#: [id number]  cc: Katha Cabal, MD, <Dictator> Katha Cabal MD ELECTRONICALLY SIGNED 01/26/2014 15:20

## 2014-11-13 NOTE — Consult Note (Signed)
Pt seen and examined. See Dawn Harrison's notes. Blood per colostomy. Hx of bleeding ulcer vs radiation esophagitis. Need records from [facility]. NPO after MN. Will consider EGD tomorrow if patient stable. Thanks.  Electronic Signatures: Verdie Shire (MD)  (Signed on 18-Jun-15 17:09)  Authored  Last Updated: 18-Jun-15 17:09 by Verdie Shire (MD)

## 2014-11-13 NOTE — H&P (Signed)
PATIENT NAME:  Gerald Cooke, Gerald Cooke MR#:  [id number] DATE OF BIRTH:  [birth year]  DATE OF ADMISSION:  09/18/2013  REFERRING PHYSICIAN: Gretchen Short. Beather Arbour, MD  PRIMARY CARE PHYSICIAN: Eduard Clos. Gilford Rile, MD  CARDIOLOGY: Minna Merritts, MD  ONCOLOGY: Martie Lee. Choksi, MD  NEPHROLOGY: Tama High, MD  CHIEF COMPLAINT: Melena.  HISTORY OF PRESENT ILLNESS: This is a 74 year old male with significant past medical history of end-stage renal disease, on hemodialysis, as well stage IIIB lung cancer, status post radiation, who is followed regularly by Dr. Oliva Bustard. The patient presents with complaints of melena. The patient was recently admitted to [facility] for similar complaints. The patient was actually admitted January 30th of last month for complaint of chest pain, where he was diagnosed with non-ST-elevated MI, where he was transferred to [facility] for possible need of CABG and aortic valve replacement surgery due to his severe stenosis. Also, the patient was noticed to have melena then. The patient reports he had endoscopy done then by Dr. Paulita Fujita, where he was found to have radiation esophagitis, where he had cauterization done. The patient as well reports he was last week at [facility] for melena, which resolved without any intervention. He presents today with episodes of melena in his ostomy bag. Reports he had vomiting earlier during the day, but denies any coffee-ground emesis, reports a small amount of what he ate earlier today, but he noticed to have some melena in his ostomy bag, which was seen by ED physician as well, so hospitalists were requested to admit the patient. The patient's hemoglobin today is 8.9. His last hemoglobin level was 10.5 on February 22nd at [facility]. The patient was mildly tachycardic at 106 or 108, blood pressure was acceptable though. The patient was found to have mildly elevated troponin at 0.12. Last troponin we have on the patient was done upon his discharge which  was 1.2 by reviewing his old record. The patient had bifascicular block in his EKG. EKG did not show any acute changes. Denies any chest pain, any shortness of breath, any dizziness or lightheadedness.   PAST MEDICAL HISTORY:  1. Coronary artery disease.  2. Severe aortic stenosis.  3. End-stage renal disease, on hemodialysis Monday, Wednesday, Friday.  4. Stage IIIB lung cancer, status post chemotherapy and radiation.  5. Hypertension.  6. History of ruptured AAA. 7. Gastroesophageal reflux disease.  8. Hyperlipidemia.  9. Radiation esophagitis.   SOCIAL HISTORY: Remote tobacco use. No alcohol or drug use.   FAMILY HISTORY: Significant for diabetes, hypertension and coronary artery disease.   ALLERGIES:  1. HEPARIN CAUSES SEVERE HYPOTENSION.  2. ALLERGY TO TETANUS TOXOID, WHICH CAUSES ARM SWELLING.   HOME MEDICATIONS:  1. Aspirin 81 mg oral daily.  2. Zofran as needed.  3. Atorvastatin 40 mg oral at bedtime. 4. Metoprolol 25 mg oral daily. 5. Combivent 1 puff 2 times a day as needed.  6. Amlodipine 2.5 mg oral daily.  7. Sucralfate 2 grams oral 2 times a day.  8. Visine ophthalmic solution 2 times a day as needed.  9. Renvela 800 mg 3 tablets 3 times a day.  10. Pantoprazole 40 mg oral daily.   REVIEW OF SYSTEMS:  CONSTITUTIONAL: Denies fever, chills, weight gain, weight loss.  EYES: Denies blurry vision, double vision, inflammation, glaucoma.  ENT: Denies tinnitus, ear pain, hearing loss, epistaxis. RESPIRATORY: Denies cough, wheezing, hemoptysis, dyspnea.  CARDIOVASCULAR: Denies chest pain, edema, arrhythmia, palpitations, syncope.  GASTROINTESTINAL: Denies nausea. Reports 1 episode of  vomiting. Reports melena in ostomy bag. Denies abdominal pain or jaundice.  GENITOURINARY: Denies dysuria, hematuria, renal colic.  ENDOCRINE: Denies polyuria or polydipsia, heat or cold intolerance.  HEMATOLOGY: He reports melena. Reports easy bruising.  INTEGUMENTARY: Denies acne, rash  or skin lesions.  MUSCULOSKELETAL: Denies any swelling, gout or cramps.  NEUROLOGIC: Denies history of CVA, TIA, ataxia, tremor.  PSYCHIATRIC: Denies anxiety, depression or insomnia.   PHYSICAL EXAMINATION:  VITAL SIGNS: Temperature 98.9, pulse 108, respiratory rate 18, blood pressure 127/63, saturating 96% on room air.  GENERAL: An well-nourished elderly male who looks comfortable in bed, in no apparent distress.  HEENT: Head atraumatic, normocephalic. Pupils equally reactive to light. Pink conjunctivae. Anicteric sclerae. Moist oral mucosa.  NECK: Supple. No thyromegaly. No JVD.  CHEST: Good air entry bilaterally. No wheezing, rales or rhonchi.  CARDIOVASCULAR: Systolic ejection murmur 5/6 and mildly tachycardic, regular. No gallops. S1, S2 heard.  ABDOMEN: Has ostomy bag in the right abdomen. Currently, has a new ostomy bag and no contents in the bag, but there is no erythema and no active bleed noticed from site.  EXTREMITIES: No edema. No clubbing. No cyanosis. Pedal pulses felt bilaterally.  PSYCHIATRIC: Appropriate affect. Awake, alert x3. Intact judgment and insight.  NEUROLOGIC: Cranial nerves grossly intact. Motor 5 out of 5. No focal deficits.  MUSCULOSKELETAL: No joint effusion or erythema.   PERTINENT LABORATORIES: Glucose 90, BUN 53, creatinine 6.27, sodium 139, potassium 4.9, chloride 108, CO2 25, ALT 21, AST 28, alkaline phosphatase 71. Troponin 0.12. White blood cells 3.7, hemoglobin 8.9, hematocrit 27, platelets 169.    ASSESSMENT AND PLAN:  1. Melena. This is due to gastrointestinal bleed, most likely upper from his known history of radiation esophagitis. The patient will be kept n.p.o. Will have him on IV Protonix b.i.d. Will monitor his hemoglobin every 8 hours. Will transfuse if needed. Will consult GI. Will continue the patient on sucralfate and will hold his aspirin.  2. End-stage renal disease. Will consult nephrology to continue hemodialysis Monday, Wednesday, Friday.   3. Elevated troponins. The patient has no EKG changes. The patient's are chronically elevated, and this is actually lower than his discharge value recently. This is most likely related to end-stage renal disease. The patient will continue to cycle his cardiac enzymes every 4 hours and follow the trend. He denies any chest pain or shortness of breath. 4. History of coronary artery disease. Will hold his aspirin and beta blockers currently as his blood pressure is on the lower side and he presents with gastrointestinal bleed.  5. Hypertension. Blood pressure is on the lower side. Will hold his medications. 6. Gastroesophageal reflux disease. Continue with PPI.  7. Hyperlipidemia. Continue with statin.   CODE STATUS: The patient reports his daughter is his healthcare power of attorney and he is a full code.   TOTAL TIME SPENT ON ADMISSION AND PATIENT CARE: 55 minutes.   ____________________________ Albertine Patricia, MD dse:lb D: 09/18/2013 02:08:31 ET T: 09/18/2013 06:22:38 ET JOB#: [id number]  cc: Albertine Patricia, MD, <Dictator> Basheer Molchan Graciela Husbands MD ELECTRONICALLY SIGNED 09/18/2013 23:57

## 2014-11-13 NOTE — H&P (Signed)
PATIENT NAME:  Gerald Cooke, Gerald Cooke MR#:  [id number] DATE OF BIRTH:  [birth year]  DATE OF ADMISSION:  12/17/2013   PRIMARY CARE PHYSICIAN: Anderson Malta A. Gilford Rile, MD.  ONCOLOGYMartie Lee. Choksi, MD.  VASCULAR SURGERY: Algernon Huxley, MD.   REQUESTING PHYSICIAN: Rae Halsted. Ramiah, MD.  CHIEF COMPLAINT: Dizziness.   HISTORY OF PRESENT ILLNESS: The patient is a 74 year old male with a known history of stage IIIB lung cancer who was seen at the cancer center today for followup of lung cancer. He was feeling very weak and tired. He had his dialysis access in the left arm clotted over the weekend and temporary dialysis catheter was placed in the right groin yesterday, but his dressing was very saturated with blood this morning and was continued to bleed, and he was feeling dizzy and decided to go to oncology office where he had blood drawn and was found to have hemoglobin of 6.9 and was requested to get admitted for direct admit. Patient feels that overall he tolerated first chemotherapy last Tuesday well, but has been drained since vascular procedure on 27th of May.   PAST MEDICAL HISTORY:  1.  Stage IIIB lung cancer.  2.  End-stage renal disease on hemodialysis Monday, Wednesday, Friday.  3.  Coronary disease, status post cardiac catheterization.  4.  Hypertension.  5.  Ruptured abdominal aortic aneurysm in 2008. 6.  GERD.  7.  Hyperlipidemia.  8.  Radiation esophagitis.  9.  Anemia of chronic disease.   PAST SURGICAL HISTORY:  1.  Cardiac catheterization.  2.  History of EGD.  3.  Ruptured abdominal aortic aneurysm repair.   ALLERGIES: HEPARIN, TETANUS-DIPHTHERIA TOXOID.   PSYCHOSOCIAL HISTORY: Lives at home with his stepson. No history of smoking, alcohol, or illicit drug use.   FAMILY HISTORY: Positive for diabetes, hypertension, coronary artery disease.   MEDICATIONS AT HOME:  1.  Oxycodone 5 mg p.o. every 4-6 hours as needed.  2.  Fentanyl 4 mcg patch transdermally every 72 hours. 3.   Renvela 800 mg 2 tablets p.o. 3 times a day. 4.  Combivent 1 puff inhaled twice a day as needed.  5.  Metoprolol 25 mg p.o. daily in the morning and additional tablet in the evening if needed for blood pressure more than 160.  6.  Zofran 8 mg p.o. b.i.d. as needed.  7.  Protonix 40 mg p.o. b.i.d.  8.  Sucralfate 2 grams p.o. twice a day.  9.  Colace 50 mg p.o. b.i.d.   REVIEW OF SYSTEMS:  CONSTITUTIONAL: No fever. Positive fatigue and weakness.  HEENT: Eyes: No blurred or double vision. Ear, nose, throat: No tinnitus, ear pain.  RESPIRATORY: Positive for cough and shortness of breath.  CARDIAC: No chest pain, orthopnea, edema.  GASTROINTESTINAL: No nausea, vomiting, diarrhea.  GENITOURINARY: No dysuria or hematuria.  ENDOCRINE: No polyuria or nocturia.  HEMATOLOGY: Positive for anemia and bleeding from the right groin catheter area.  NEUROLOGIC: No tingling, numbness, weakness. Positive for dizziness. PSYCHIATRY: Positive anxiety. No history of depression.  MUSCULOSKELETAL: No joint effusion.   PHYSICAL EXAMINATION:  VITAL SIGNS: Temperature 97.6, heart rate 94 per minute, respirations 18 per minute, blood pressure 102/69 (Dictation Anomaly) MISSING TEXT was saturating 98% on room air.  GENERAL: The patient is a 74 year old thin male lying in the bed comfortably without any acute distress.  HEENT: Eyes: Pupils equal, round, reactive to light and accommodation. No scleral icterus. Extraocular muscles intact. Head: Atraumatic, normocephalic. Oropharynx and nasopharynx clear.  NECK: Supple. No  jugular venous distension. No thyromegaly. No tenderness.  LUNGS: Clear to auscultation bilaterally. No wheezing, rales, rhonchi, or crepitation.  CARDIOVASCULAR: S1, S2 normal. Harsh systolic murmur, grade 4/5, palpable in the right chest wall area, likely from aortic stenosis. No rales or gallop. He does have AV fistula in the left upper extremity and has a temporary dialysis catheter in the right  femoral/groin aria.  GASTROINTESTINAL: Bowel sounds present. Soft, nontender, nondistended. No organomegaly appreciated. He does have a colostomy bag present. No signs of infection around that.  MUSCULOSKELETAL: No joint effusion or tenderness.  SKIN: No obvious rash, lesion, or ulcer.  NEUROLOGIC: Cranial nerves II through XII intact. Muscle strength 5/5 in all extremities. Sensation intact.  PSYCHIATRIC: The patient is alert and oriented x3.   LABORATORY PANEL: BMP showed BUN of 1667, creatinine 5.44, normal liver function tests. CBC showed white count of 14.8, hemoglobin 6.9, hematocrit 21.4, platelets 243,000.    IMPRESSION AND PLAN:  1.  Severe symptomatic anemia requiring transfusion with hemoglobin of 6.9. This is likely multifactorial from blood loss from the right femoral catheter area, underlying cancer, end-stage renal disease, history of gastric cancer: We will monitor, consider GI consult if need.  2. Stage IIIB lung cancer, received 1 chemotherapy last Tuesday: We will consult oncology for further evaluation and management. We will also consult palliative care for family discussion regarding goals of care.  3.  End-stage renal disease, on hemodialysis Monday, Wednesday, Friday: The patient's arteriovenous fistula was clotted this week. He does not tolerate HEPARIN very well. He actually did have a temporary femoral line inserted yesterday, but has been bleeding since insertion, has been saturating the dressings, likely normal, but we will get vascular consult as his hemoglobin has dropped down to 6.9. We will also consult nephrology for dialysis.  4.  Aortic stenosis: He was recommended to have aortic valve replacement, but I am not sure whether at this point he is a candidate for surgery as this will require lifelong anticoagulation.  5.  Gastric ulcer/bleeding: The patient has also noticed some recurrent bleeding from the colostomy over the last several days, so we will consult GI at  this time.  6.  Code state: Full code.   Total time taking care of this patient is 55 minutes.   ____________________________ Lucina Mellow. Manuella Ghazi, MD [id number] D: 12/17/2013 16:35:15 ET T: 12/17/2013 17:15:38 ET JOB#: [id number]  cc: Emiley Digiacomo S. Manuella Ghazi, MD, <Dictator> Eduard Clos. Gilford Rile, MD Efraim Kaufmann, MD Martie Lee. Oliva Bustard, MD Algernon Huxley, MD    Lucina Mellow [facility] MD ELECTRONICALLY SIGNED 12/18/2013 10:40

## 2014-11-13 NOTE — H&P (Signed)
PATIENT NAME:  Gerald Cooke, Gerald Cooke MR#:  [id number] DATE OF BIRTH:  12-29-1940  DATE OF ADMISSION:  08/21/2013  ADDENDUM   HOME MEDICATIONS:  Include aspirin 81 mg by mouth daily, Zofran 4 mg by mouth q. 6 hours as needed for nausea, vomiting, atorvastatin 20 mg by mouth daily, metoprolol 25 mg by mouth twice daily, Norvasc 10 mg by mouth daily, Renvela 800 mg 3 tablets 3 times daily before meals and one with snacks.   LABORATORY DATA:  Sodium 139, potassium 4.9, chloride 105, bicarb 25, BUN 53, creatinine 5.28, glucose 99.  LFTs within normal limits.  Troponin I 0.1, CK-MB 2.9, CK 38, WBC 7.3, hemoglobin 8.2, platelets 222, MCV 103, RDW 14.5, INR of 1.    ____________________________ Aaron Mose. Hower, MD dkh:ea D: 08/21/2013 03:17:04 ET T: 08/21/2013 03:51:59 ET JOB#: [id number]  cc: Aaron Mose. Hower, MD, <Dictator> DAVID Woodfin Ganja MD ELECTRONICALLY SIGNED 08/23/2013 20:31

## 2014-11-13 NOTE — Consult Note (Signed)
Brief Consult Note: Diagnosis: NSTEMI, aortic stenosis, probably GI bleed, ESRD on HD.   Patient was seen by consultant.   Consult note dictated.   Discussed with Attending MD.   Comments: Very concerning ECG changes with significant ST depression and ST elevation in AVr suggestive of 3 vessel or left main disease. He is allergic to Heparin.  Recommend echo and cardiac cath.  GI evaluation.  Electronic Signatures: Kathlyn Sacramento (MD)  (Signed 30-Jan-15 08:23)  Authored: Brief Consult Note   Last Updated: 30-Jan-15 08:23 by Kathlyn Sacramento (MD)

## 2014-11-13 NOTE — Consult Note (Signed)
Chief Complaint:  Subjective/Chief Complaint No specific complaints. No more bleeding from colostomy bag. Still waiting for EGD records from [facility].   VITAL SIGNS/ANCILLARY NOTES: **Vital Signs.:   19-Jun-15 07:30  Vital Signs Type Routine  Temperature Temperature (F) 97.9  Celsius 36.6  Temperature Source oral  Pulse Pulse 99  Respirations Respirations 12  Systolic BP Systolic BP 466  Diastolic BP (mmHg) Diastolic BP (mmHg) 69  Mean BP 79  Pulse Ox % Pulse Ox % 99  Pulse Ox Activity Level  At rest  Oxygen Delivery 2L; Nasal Cannula   Brief Assessment:  GEN no acute distress   Cardiac Regular   Respiratory clear BS   Gastrointestinal Blood tinge from colosctomy bag   Lab Results: Routine UA:  19-Jun-15 00:35   Color (UA) Yellow  Clarity (UA) Clear  Glucose (UA) Negative  Bilirubin (UA) Negative  Ketones (UA) Negative  Specific Gravity (UA) 1.014  Blood (UA) Negative  pH (UA) 7.0  Protein (UA) 100 mg/dL  Nitrite (UA) Negative  Leukocyte Esterase (UA) Negative (Result(s) reported on 08 Jan 2014 at 12:47AM.)  RBC (UA) 1 /HPF  WBC (UA) 2 /HPF  Bacteria (UA) NONE SEEN  Epithelial Cells (UA) NONE SEEN  Result(s) reported on 08 Jan 2014 at 12:47AM.   Assessment/Plan:  Assessment/Plan:  Assessment Hx of PUD. Hx of bleeding from colostomy bag. None right now.   Plan Tried to schedule EGD for this AM. However, anesthesia did not want to sedate patient due to pt's medical hx. Therefore, will resume diet. Moniter hgb. Continue daily PPI. Will not plan on doing EGD unless patient has active, severe bleeding. thanks.   Electronic Signatures: Verdie Shire (MD)  (Signed 19-Jun-15 09:03)  Authored: Chief Complaint, VITAL SIGNS/ANCILLARY NOTES, Brief Assessment, Lab Results, Assessment/Plan   Last Updated: 19-Jun-15 09:03 by Verdie Shire (MD)

## 2014-11-13 NOTE — Consult Note (Signed)
History of Present Illness:  Reason for Consult 1.  Renal Failure requiring dialysis. 2.  Incidental right paratracheal mass seen on CT scan biopsy consistent with poorly differentiated carcinoma, favor lung with TTF and CK7 positive. PET scan shows increased uptake in mediastinal lymph nodes 3.  Biopsies positive for carcinoma of lung stage IIIB AJCC Staging: cTx_N_M_ pT_N_3M0_ Stage Grouping:IIIB(diagnosis in October 2 009) 4.  Patient was recently hospitalized with hemoptysisat abnormal CT scan of the chest.(November of 2013)bronchoscopy was negative for  malignant cells) 5.  Abnormal PET scan (November of 2013, )bronchoscopy was negative for  malignant cells.   HPI   patient with history of carcinoma of lung non-small cell tumor treated with radiation chemotherapy had persistent abnormal lady of the right upper lobe as well as retroperitoneal lymph node.  Patient was admitted in the hospital with diffuse abdominal pain according to him started in the last few days gradually getting worse.  Poor appetite.  No diarrhea.  No nausea.  No vomiting.was admitted in the hospital as well as CT scan was done.has end-stage renal disease for him which he is on dialysis for last several years  PFSH:  Additional Past Medical and Surgical History Significant History/PMH: ??  ESRD:  ??  Lung Cancer:  ??  Anemia:  ??  Renal Insufficiency:  ??  Previous Blood Transfusions:  ??  Dialysis -  m,w,f:  ??  Hypertension:  ??  AAA:  ??  reflux:  ??  high cholesterol:  ??  Plastic Surgery/Mouth:  ??  Colostomy:  ??  abcess on spine:   Smoking History: Smoking History Never Smoked. and Hasn't smoked in 17 years.Marland Kitchen.(1)  PFSH: Additional Past Medical and Surgical History: Past medical history: Renal failure requiring dialysis, Hypertension.    Past surgical history: Ruptured aortic aneurysm repair requiring nephrectomy, partial bowel resection status post colostomy.    Family history: Negative and  noncontributory.    Social history: Remote history of tobacco use, denies alcohol.   Review of Systems:  General weakness  fatigue  has lost significant amount of   weight   Performance Status (ECOG) 2   HEENT no complaints   Lungs cough  SOB   Cardiac no complaints   GI As mentioned above   GU no complaints   Musculoskeletal no complaints   Extremities no complaints   Skin no complaints   Neuro no complaints   Psych anxiety   NURSING NOTES: **Vital Signs.:   11-May-15 14:21   Vital Signs Type: Routine   Temperature Temperature (F): 98.4   Celsius: 36.8   Temperature Source: oral   Pulse Pulse: 93   Respirations Respirations: 20   Systolic BP Systolic BP: 841   Diastolic BP (mmHg) Diastolic BP (mmHg): 78   Mean BP: 96   Pulse Ox % Pulse Ox %: 97   Pulse Ox Activity Level: At rest   Oxygen Delivery: Room Air/ 21 %   Physical Exam:  General patient is alert oriented lying in the bed complaining of abdominal pain.  Somewhat agitated.   HEENT: normal   Lungs: crepitations   Cardiac: tachy  cardia   Breast: not examined   Abdomen: positive bowel sounds  tender  distended   Skin: intact   Extremities: edema  trace   Psych: normal appearance     ESRD:    Lung Cancer:    Anemia:    Renal Insufficiency:    Previous Blood Transfusions:    Dialysis -  m,w,f:  Hypertension:    AAA:    reflux:    high cholesterol:    Plastic Surgery/Mouth:    Colostomy:    abcess on spine:    Tetanus /Diphtheria Toxoid: Other  Heparin: Other    Renvela carbonate 800 mg oral tablet: 3 tab(s) orally 3 times a day (before meals),1 with snacks, Status: Active, Quantity: 0, Refills: None   Combivent Respimat CFC free 100 mcg-20 mcg/inh inhalation aerosol: 1 puff(s) inhaled 2 times a day, As Needed - for Shortness of Breath, for Wheezing , Status: Active, Quantity: 0, Refills: None   metoprolol tartrate 25 mg oral tablet: 1 tab(s) orally  once a day, additional tablet in the evening if blood pressure is over 160., Status: Active, Quantity: 0, Refills: None   ondansetron 8 mg oral tablet: 1 tab(s) orally 2 times a day, As Needed - for Nausea, Vomiting , Status: Active, Quantity: 0, Refills: None   pantoprazole 40 mg oral delayed release tablet: 1 tab(s) orally 2 times a day, Status: Active, Quantity: 0, Refills: None   sucralfate 1 g/10 mL oral suspension: 2 gram(s) orally 2 times a day, Status: Active, Quantity: 0, Refills: None   Visine 0.05% ophthalmic solution: 1 drop(s) to each affected eye 2 times a day, As Needed, Status: Active, Quantity: 0, Refills: None  Laboratory Results:  Hepatic:  10-May-15 22:52   Bilirubin, Total 0.4  Alkaline Phosphatase 65 (45-117 NOTE: New Reference Range 06/12/13)  SGPT (ALT) 15  SGOT (AST) 18  Total Protein, Serum 6.9  Albumin, Serum  2.5  Cardiology:  10-May-15 23:47   Ventricular Rate 94  Atrial Rate 94  P-R Interval 138  QRS Duration 128  QT 400  QTc 500  P Axis 63  R Axis -55  T Axis 22  ECG interpretation *** Poor data quality, interpretation may be adversely affected Normal sinus rhythm Right bundle branch block Left anterior fascicular block *** Bifascicular block *** Left ventricular hypertrophy with QRS widening Abnormal ECG When compared with ECG of 17-Sep-2013 23:27, Criteria for Septal infarct are no longer Present Nonspecific T wave abnormality has replaced inverted T waves in Anterior leads ----------unconfirmed---------- Confirmed by OVERREAD, NOT ([id number]), editor PEARSON, BARBARA ([id number]) on 11/30/2013 8:35:53 AM  Routine Chem:  10-May-15 22:52   Result Comment TROPONIN - RESULTS VERIFIED BY REPEAT TESTING.  - C/KATIE NEWSHOLME AT 2346 11/29/13.PMH  - READ-BACK PROCESS PERFORMED.  Result(s) reported on 29 Nov 2013 at 11:48PM.  Glucose, Serum 86  BUN  57  Creatinine (comp)  8.35  Sodium, Serum  135  Potassium, Serum 4.8  Chloride, Serum 101  CO2, Serum  26  Calcium (Total), Serum 9.4  Osmolality (calc) 285  eGFR (African American)  7  eGFR (Non-African American)  6 (eGFR values <65m/min/1.73 m2 may be an indication of chronic kidney disease (CKD). Calculated eGFR is useful in patients with stable renal function. The eGFR calculation will not be reliable in acutely ill patients when serum creatinine is changing rapidly. It is not useful in  patients on dialysis. The eGFR calculation may not be applicable to patients at the low and high extremes of body sizes, pregnant women, and vegetarians.)  Anion Gap 8  Lipase 245 (Result(s) reported on 29 Nov 2013 at 11:39PM.)  11-May-15 03:16   Result Comment TROPONIN - RESULTS VERIFIED BY REPEAT TESTING.  - ELEVATED TROPONIN PREVIOUSLY CALLED AT  - 2346 11/29/13.PMH  Result(s) reported on 30 Nov 2013 at 05:12AM.    07:07   Result Comment  TROPONIN - PREVIOUSLY CALLED TO KATIE NEWSHOLME  - AT 2346 ON 11/29/13 BY PMH. SAL.  - RESULTS VERIFIED BY REPEAT TESTING.  Result(s) reported on 30 Nov 2013 at 08:24AM.  Cardiac:  10-May-15 22:52   Troponin I  1.40 (0.00-0.05 0.05 ng/mL or less: NEGATIVE  Repeat testing in 3-6 hrs  if clinically indicated. >0.05 ng/mL: POTENTIAL  MYOCARDIAL INJURY. Repeat  testing in 3-6 hrs if  clinically indicated. NOTE: An increase or decrease  of 30% or more on serial  testing suggests a  clinically important change)  CPK-MB, Serum 2.5 (Result(s) reported on 30 Nov 2013 at 12:15AM.)  11-May-15 03:16   Troponin I  1.40 (0.00-0.05 0.05 ng/mL or less: NEGATIVE  Repeat testing in 3-6 hrs  if clinically indicated. >0.05 ng/mL: POTENTIAL  MYOCARDIAL INJURY. Repeat  testing in 3-6 hrs if  clinically indicated. NOTE: An increase or decrease  of 30% or more on serial  testing suggests a  clinically important change)  CPK-MB, Serum 2.5 (Result(s) reported on 30 Nov 2013 at 04:02AM.)    07:07   Troponin I  1.20 (0.00-0.05 0.05 ng/mL or less: NEGATIVE  Repeat  testing in 3-6 hrs  if clinically indicated. >0.05 ng/mL: POTENTIAL  MYOCARDIAL INJURY. Repeat  testing in 3-6 hrs if  clinically indicated. NOTE: An increase or decrease  of 30% or more on serial  testing suggests a  clinically important change)  CPK-MB, Serum 2.7 (Result(s) reported on 30 Nov 2013 at 07:57AM.)  Routine UA:  11-May-15 03:00   Color (UA) Yellow  Clarity (UA) Clear  Glucose (UA) 50 mg/dL  Bilirubin (UA) Negative  Ketones (UA) Negative  Specific Gravity (UA) 1.011  Blood (UA) Negative  pH (UA) 8.0  Protein (UA) 100 mg/dL  Nitrite (UA) Negative  Leukocyte Esterase (UA) Negative (Result(s) reported on 30 Nov 2013 at 03:56AM.)  RBC (UA) <1 /HPF  WBC (UA) <1 /HPF  Bacteria (UA) NONE SEEN  Epithelial Cells (UA) <1 /HPF (Result(s) reported on 30 Nov 2013 at 03:56AM.)  Routine Hem:  10-May-15 22:52   WBC (CBC) 9.9  RBC (CBC)  3.54  Hemoglobin (CBC)  10.0  Hematocrit (CBC)  32.2  Platelet Count (CBC) 266  MCV 91  MCH 28.2  MCHC  31.0  RDW  16.3  Neutrophil % 82.9  Lymphocyte % 3.7  Monocyte % 9.9  Eosinophil % 2.3  Basophil % 1.2  Neutrophil #  8.2  Lymphocyte #  0.4  Monocyte # 1.0  Eosinophil # 0.2  Basophil # 0.1 (Result(s) reported on 29 Nov 2013 at 11:16PM.)   Radiology Results:  LabUnknown:    11-May-15 02:03, CT Abdomen and Pelvis Without Contrast  PACS Image   CT:  CT Abdomen and Pelvis Without Contrast   REASON FOR EXAM:    (1) general abdominal pain, no colostomy output 2   days; (2) general abdominal pa  COMMENTS:       PROCEDURE: CT  - CT ABDOMEN AND PELVIS W0  - Nov 30 2013  2:03AM     ADDENDUM REPORT: 11/30/2013 02:25    ADDENDUM:  Comparison study from 08/29/2013 is obtained. The ascites an nodular  infiltrative changes seen in the omentum as well as the  retroperitoneal lymphadenopathy are all developing/increasing since  the previous study.    Electronically Signed    By: Adrienne Mocha.D.    On: 11/30/2013  02:25    CLINICAL DATA:  Diffuse abdominal pain starting today. No bowel  movement for today.  Previous colostomy and stomach ulcer.    EXAM:  CT ABDOMEN AND PELVIS WITHOUT CONTRAST    TECHNIQUE:  Multidetector CT imaging of theabdomen and pelvis was performed  following the standard protocol without IV contrast.    COMPARISON:  Prior comparison studies are not available.  FINDINGS:  Fibrosis and emphysematous changes in the lung bases. Calcified  pleural plaques. Coronary artery calcifications.    Free fluid in the upper abdomen, extending down the pericolic  gutters, and into the pelvis. Hounsfield unit measurements are  consistent with ascites. The unenhanced appearance of the liver,  spleen, gallbladder, pancreas, and adrenal glands is unremarkable.  Diffuse renal atrophy bilaterally. No hydronephrosis. Extensive  vascular calcifications involving the abdominal aorta and all branch  vessels. Aortobiiliac graft is in place. Inferior vena cava is  normal in size. There are retroperitoneal lymph nodes present with  mild enlargement. Largest measurement is 16 mm diameter. These could  represent reactive or metastatic nodes. Nodular changes in the  omentum and mesenteric could relate to the ascites but appearanceis  suspicious for peritoneal metastases. Nonspecific calcification  medial and inferior to the left kidney probably representing  dystrophic calcification or prior postinflammatory calcification.  The stomach is incompletely distended. Small bowel are mostly  decompressed. Contrast material does flow to the colon suggesting no  evidence of obstruction. There is a right lower quadrant transverse  colostomy. Resection of the distal colon. There is a small midline  abdominal wall hernia through thelinea elbow. The hernia contains  small bowel but without proximal obstruction.    Pelvis: Small bilateral inguinal hernias. Fluid in the pelvis as  previously discussed. Bladder  wall is not thickened. Calcifications  in the prostate gland. Prostate appears atrophied. No significant  lymphadenopathy in the pelvis. Appendix is normal. Degenerative  changes in the spine. No destructive bone lesions appreciated.   IMPRESSION:  No evidence of bowel obstruction. Mild diffuse abdominal and pelvic  ascites with nodular infiltration in the omentum as and  retroperitoneal lymphadenopathy suggesting possible metastatic  disease. Small inguinal hernias. Small midline abdominal wall hernia  containing small bowel. Bilateral renal atrophy.    Electronically Signed:  By: Lucienne Capers M.D.  On: 11/30/2013 02:20         Verified By: Neale Burly, M.D.,   Assessment and Plan: Impression:   non-small cell carcinoma of lung status post chemoradiation therapyscan has been reviewed shows ascites and question able peritoneal metastasesenlargement of the retroperitoneal lymph node not changed from the previous CT scanabnormallity right upper lobe lunghas end-stage renal diseasea previous history of peptic ulcer disease Plan:   abdominal paracentesis and send fluid for cytologyreevaluate patient after information is available  Electronic Signatures: Jobe Gibbon (MD)  (Signed 11-May-15 15:02)  Authored: HISTORY OF PRESENT ILLNESS, PFSH, ROS, NURSING NOTES, PE, PAST MEDICAL HISTORY, ALLERGIES, HOME MEDICATIONS, LABS, OTHER RESULTS, ORDERS, ASSESSMENT AND PLAN   Last Updated: 11-May-15 15:02 by Jobe Gibbon (MD)

## 2014-11-13 NOTE — Consult Note (Signed)
 General Aspect Gerald Cooke is an elderly male with severe aortic stenosis, Stage III lung cancer status post chemotherapy and radiation., ESRD, HTN, AAA repair,  End-stage renal disease on hemodialysis, Coronary artery disease status post cardiac catheterization and diagnosed with non-ST MI on January 30th with critical aortic stenosis . he was admitted with abdominal pain.  Imaging studies of the abdomen have revealed lesions suspicious of metastatic cancer.   he has seen Dr.s Cooper and Owen in the TAVR clinic for his aortic stenosis.  he was determined to NOT be a candidate for standard open AVR and was thought to possibly a candidate for TAVR.   he was admitted with abdominal pain, poor appetite and continued weight loss.  he was found to have a mildly elevated Troponin level.  he has not had any recent cardiac symptoms.   Physical Exam:  GEN cachectic, critically ill appearing   HEENT pink conjunctivae, dry oral mucosa   NECK No masses   RESP clear BS   CARD Regular rate and rhythm  Murmur   Murmur Systolic  3-4/6 systolic murmur   Systolic Murmur Out flow   ABD denies tenderness  normal BS  he has an iliostomy,   LYMPH negative neck   EXTR negative edema   SKIN normal to palpation   NEURO cranial nerves intact   PSYCH alert   Review of Systems:  General: No Complaints   Skin: No Complaints   ENT: No Complaints   Eyes: No Complaints   Neck: No Complaints   Respiratory: Short of breath   Cardiovascular: No Complaints   Gastrointestinal: abdominal pain   Genitourinary: No Complaints   Vascular: No Complaints   Musculoskeletal: No Complaints   Neurologic: No Complaints   Hematologic: anemia of chronic disease   Psychiatric: No Complaints   Review of Systems: All other systems were reviewed and found to be negative   Family & Social History:  Family and Social History:  Family History Coronary Artery Disease  his son died of MI   Social History  positive tobacco (Greater than 1 year)     ESRD:    Lung Cancer:    Anemia:    Renal Insufficiency:    Previous Blood Transfusions:    Dialysis -  m,w,f:    Hypertension:    AAA:    reflux:    high cholesterol:    Plastic Surgery/Mouth:    Colostomy:    abcess on spine:   Lab Results: Hepatic:  10-May-15 22:52   Bilirubin, Total 0.4  Alkaline Phosphatase 65 (45-117 NOTE: New Reference Range 06/12/13)  SGPT (ALT) 15  SGOT (AST) 18  Total Protein, Serum 6.9  Albumin, Serum  2.5  Cardiology:  10-May-15 23:47   Ventricular Rate 94  Atrial Rate 94  P-R Interval 138  QRS Duration 128  QT 400  QTc 500  P Axis 63  R Axis -55  T Axis 22  ECG interpretation *** Poor data quality, interpretation may be adversely affected Normal sinus rhythm Right bundle branch block Left anterior fascicular block *** Bifascicular block *** Left ventricular hypertrophy with QRS widening Abnormal ECG When compared with ECG of 17-Sep-2013 23:27, Criteria for Septal infarct are no longer Present Nonspecific T wave abnormality has replaced inverted T waves in Anterior leads ----------unconfirmed---------- Confirmed by OVERREAD, NOT ([id number]), editor PEARSON, BARBARA ([id number]) on 11/30/2013 8:35:53 AM  Routine Chem:  10-May-15 22:52   Result Comment TROPONIN - RESULTS VERIFIED BY REPEAT TESTING.  -   C/KATIE NEWSHOLME AT 2346 11/29/13.[facility]  - READ-BACK PROCESS PERFORMED.  Result(s) reported on 29 Nov 2013 at 11:48PM.  Glucose, Serum 86  BUN  57  Creatinine (comp)  8.35  Sodium, Serum  135  Potassium, Serum 4.8  Chloride, Serum 101  CO2, Serum 26  Calcium (Total), Serum 9.4  Osmolality (calc) 285  eGFR (African American)  7  eGFR (Non-African American)  6 (eGFR values <60mL/min/1.73 m2 may be an indication of chronic kidney disease (CKD). Calculated eGFR is useful in patients with stable renal function. The eGFR calculation will not be reliable in acutely ill patients when serum  creatinine is changing rapidly. It is not useful in  patients on dialysis. The eGFR calculation may not be applicable to patients at the low and high extremes of body sizes, pregnant women, and vegetarians.)  Anion Gap 8  Lipase 245 (Result(s) reported on 29 Nov 2013 at 11:39PM.)  11-May-15 03:16   Result Comment TROPONIN - RESULTS VERIFIED BY REPEAT TESTING.  - ELEVATED TROPONIN PREVIOUSLY CALLED AT  - 2346 11/29/13.PMH  Result(s) reported on 30 Nov 2013 at 05:12AM.    07:07   Result Comment TROPONIN - PREVIOUSLY CALLED TO KATIE NEWSHOLME  - AT 2346 ON 11/29/13 BY [facility]. SAL.  - RESULTS VERIFIED BY REPEAT TESTING.  Result(s) reported on 30 Nov 2013 at 08:24AM.  Cardiac:  10-May-15 22:52   Troponin I  1.40 (0.00-0.05 0.05 ng/mL or less: NEGATIVE  Repeat testing in 3-6 hrs  if clinically indicated. >0.05 ng/mL: POTENTIAL  MYOCARDIAL INJURY. Repeat  testing in 3-6 hrs if  clinically indicated. NOTE: An increase or decrease  of 30% or more on serial  testing suggests a  clinically important change)  CPK-MB, Serum 2.5 (Result(s) reported on 30 Nov 2013 at 12:15AM.)  11-May-15 03:16   Troponin I  1.40 (0.00-0.05 0.05 ng/mL or less: NEGATIVE  Repeat testing in 3-6 hrs  if clinically indicated. >0.05 ng/mL: POTENTIAL  MYOCARDIAL INJURY. Repeat  testing in 3-6 hrs if  clinically indicated. NOTE: An increase or decrease  of 30% or more on serial  testing suggests a  clinically important change)  CPK-MB, Serum 2.5 (Result(s) reported on 30 Nov 2013 at 04:02AM.)    07:07   Troponin I  1.20 (0.00-0.05 0.05 ng/mL or less: NEGATIVE  Repeat testing in 3-6 hrs  if clinically indicated. >0.05 ng/mL: POTENTIAL  MYOCARDIAL INJURY. Repeat  testing in 3-6 hrs if  clinically indicated. NOTE: An increase or decrease  of 30% or more on serial  testing suggests a  clinically important change)  CPK-MB, Serum 2.7 (Result(s) reported on 30 Nov 2013 at 07:57AM.)  Routine UA:  11-May-15  03:00   Color (UA) Yellow  Clarity (UA) Clear  Glucose (UA) 50 mg/dL  Bilirubin (UA) Negative  Ketones (UA) Negative  Specific Gravity (UA) 1.011  Blood (UA) Negative  pH (UA) 8.0  Protein (UA) 100 mg/dL  Nitrite (UA) Negative  Leukocyte Esterase (UA) Negative (Result(s) reported on 30 Nov 2013 at 03:56AM.)  RBC (UA) <1 /HPF  WBC (UA) <1 /HPF  Bacteria (UA) NONE SEEN  Epithelial Cells (UA) <1 /HPF (Result(s) reported on 30 Nov 2013 at 03:56AM.)  Routine Hem:  10-May-15 22:52   WBC (CBC) 9.9  RBC (CBC)  3.54  Hemoglobin (CBC)  10.0  Hematocrit (CBC)  32.2  Platelet Count (CBC) 266  MCV 91  MCH 28.2  MCHC  31.0  RDW  16.3  Neutrophil % 82.9  Lymphocyte % 3.7    Monocyte % 9.9  Eosinophil % 2.3  Basophil % 1.2  Neutrophil #  8.2  Lymphocyte #  0.4  Monocyte # 1.0  Eosinophil # 0.2  Basophil # 0.1 (Result(s) reported on 29 Nov 2013 at 11:16PM.)    Tetanus /Diphtheria Toxoid: Other  Heparin: Other  Vital Signs/Nurse's Notes: **Vital Signs.:   11-May-15 05:30  Vital Signs Type Admission  Temperature Temperature (F) 97.7  Celsius 36.5  Temperature Source oral  Pulse Pulse 91  Respirations Respirations 20  Systolic BP Systolic BP 136  Diastolic BP (mmHg) Diastolic BP (mmHg) 76  Mean BP 96  Pulse Ox % Pulse Ox % 97  Pulse Ox Activity Level  At rest  Oxygen Delivery Room Air/ 21 %    14:21  Vital Signs Type Routine  Temperature Temperature (F) 98.4  Celsius 36.8  Temperature Source oral  Pulse Pulse 93  Respirations Respirations 20  Systolic BP Systolic BP 132  Diastolic BP (mmHg) Diastolic BP (mmHg) 78  Mean BP 96  Pulse Ox % Pulse Ox % 97  Pulse Ox Activity Level  At rest  Oxygen Delivery Room Air/ 21 %    Impression Gerald Cooke has severe Aortic stenosis and has CAD.  the recent cath by Dr. Cooper revealed a calcified LM stenosis but it was not hemodynamically significant.  he has not had any worsening cardiac symptoms - he has dyspnea but no angina.  I'm  not sure why a Troponin level was drawn.   Patients with renal failure will often have chronically elevated Troponin levels.  I suspect his chronically elevated Troponin is due to his severe aortic stenosis and the fact that it is not being cleared by the kidneys.    No indication for any ischemic evaluation at this point.  we do not need to draw any additional Troponin levels.   The more concerning issue is this possibility of mets to the abdomen.   Further evaluation is underway / planned,   If he is found to have mets to the abdomen, I do not think he would be  a candidate for TAVR or any other invasive cardiology procedures.   Electronic Signatures: Nahser, Philip Joseph (MD)  (Signed 11-May-15 18:25)  Authored: General Aspect/Present Illness, History and Physical Exam, Review of System, Family & Social History, Past Medical History, Labs, Allergies, Vital Signs/Nurse's Notes, Impression/Plan   Last Updated: 11-May-15 18:25 by Nahser, Philip Joseph (MD) 

## 2014-11-13 NOTE — Consult Note (Signed)
Details:   - Patient patient has been seen and evaluated is not known patient to me with malignant ascites.  Patient also had paratracheal lymph node in the past.  Treated with radiation chemotherapy Malignance has a suggestive of malignancy due to GI cancer. Also has chronic renal dysfunction on dialysis and multiple other medical illnesses Chemotherapy was put on hold because of declining condition  Detail note to follow.  Patient was admitted in hospital with GI bleeding.  Upper endoscopy spending. 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

## 2014-11-13 NOTE — Consult Note (Signed)
I received a phone call from  pathologist.fluid is positive for malignant cells.would discuss this result with the patient tomorrow morning  and discuss further planning  rodding of treatment.    Electronic Signatures: Jobe Gibbon (MD) (Signed on 13-May-15 15:44)  Authored   Last Updated: 13-May-15 15:45 by Jobe Gibbon (MD)

## 2014-11-13 NOTE — Consult Note (Signed)
   Comments   I spoke with pt's daughter by phone. Updated her on pt's current medical condition. We discussed pt's wish to continue dialysis and daughter defers to pt as long as he is able to make this decision. I explained that with pt's decline, he may no longer be able to d/c to SNF with outpt dialysis and may need LTACH. Daughter was in agreement with this. CM notified.   Electronic Signatures: Phifer, Izora Gala (MD)  (Signed 24-Jun-15 11:54)  Authored: Palliative Care   Last Updated: 24-Jun-15 11:54 by Phifer, Izora Gala (MD)

## 2014-11-13 NOTE — Discharge Summary (Signed)
PATIENT NAME:  Gerald Cooke, Gerald Cooke MR#:  [id number] DATE OF BIRTH:  February 18, 1941  DATE OF ADMISSION:  12/17/2013 DATE OF DISCHARGE:  12/21/2013  The patient is being discharged to [facility] for rehab.   PRESENTING COMPLAINT: Weakness and anemia.   DISCHARGE DIAGNOSES: 1.  Acute blood loss anemia, status post 1 unit blood transfusion, from dialysis catheter and colostomy bag, resolved.  2.  History of lung cancer, status post chemotherapy.  3.  History of gastric cancer.  4.  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 The patient is on hemodialysis Monday, Wednesday and Friday through femoral dialysis catheter placed on 12/16/2013 as outpatient.  5.  Hypertension.   CODE STATUS: Limited code.   DIAGNOSTIC DATA: Labs at discharge: Potassium is 4.2, chloride is 106. H and H 8.2 and 25.6. White count 10. Magnesium 1.8. TSH 6.28. Hemoglobin on admission was 6.9.   CONSULTANTS:  1.  Gastroenterology with Dr. Candace Cruise.  2.  Oncology with Dr. Kallie Edward.  3.  Nephrology with Dr. Candiss Norse.   DISCHARGE MEDICATIONS: 1.  Acetaminophen 600 mg p.o. q. 4 hours p.r.n.  2.  Protonix 40 mg p.o. daily.  3.  Oxycodone immediate release 5 mg q. 4 p.r.n.  4.  Fentanyl patch 12 mcg topical q. 3 days.  5.  Zofran 8 mg b.i.d. p.r.n.  6.  Albuterol ipratropium/Combivent Respimat 1 puff q.i.d. p.r.n.  7.  Toprol-XL 25 mg daily.  8.  Docusate 100 mg b.i.d.  9.  Carafate 2 grams p.o. b.i.d.  10.  Renvela 0.8 grams t.i.d.   DISCHARGE INSTRUCTIONS: 1.  Physical therapy. 2.  Renal diet.  3.  Continue hemodialysis Monday, Wednesday, and Friday.  4.  Follow up with vascular surgery, Dr. Lucky Cowboy or Dr. Delana Meyer, in 1 week.   DISCHARGE VITALS: Blood pressure is 106/72, sats are 95% on room air, pulse is 101, and temperature is 98.7.   HISTORY OF PRESENT ILLNESS: Gerald Cooke is a 74 year old Caucasian gentleman with history of lung cancer and anemia of chronic disease who came in with weakness and bleeding from  dialysis catheter site. The patient was admitted with:  1.  Acute blood loss anemia on anemia of chronic disease. The patient had bleeding from his hemodialysis catheter that was placed on May 27th by Dr. Delana Meyer. It was a right femoral tunneled catheter. The patient also had some bleeding from his colostomy site. He was seen by vascular surgery, and the patient's dialysis catheter stopped bleeding. We were able to use it for hemodialysis. He received 1 unit of blood transfusion. The patient was also seen by gastroenterology for colostomy bag bleeding. The patient did not have any procedures and his bleeding resolved. He was able to tolerate diet well.  2.  History of lung cancer, on chemotherapy. The patient was seen by Dr. Kallie Edward and will follow up at the [facility].  3.  End-stage renal disease on hemodialysis with recent AV fistula malformation. The patient has had right femoral tunneled dialysis catheter placed which initially had complications with bleeding, however, it was resolved and is tolerating dialysis well. He is on Monday, Wednesday, and Friday schedule.  4.  Hypertension. Home meds were continued.  5.  Chronic pain. The patient is on fentanyl patch and p.o. oxycodone.   Hospital stay otherwise remained stable. The patient was seen by physical therapy who recommended rehab. He will be discharged to [facility] for rehab. He remained a limited code. Discharge plan was discussed with daughter earlier.  TIME SPENT: 40 minutes.   ____________________________ Gerald Rochester Posey Pronto, MD sap:sb D: 12/21/2013 13:11:26 ET T: 12/21/2013 14:02:09 ET JOB#: [id number]  cc: [name], MD, <Dictator> Katha Cabal, MD Algernon Huxley, MD Rae Halsted Kallie Edward, MD Murlean Iba, MD [place] Candace Cruise, MD  Ilda Basset MD ELECTRONICALLY SIGNED 01/04/2014 13:19

## 2014-11-13 NOTE — H&P (Signed)
PATIENT NAME:  Gerald Cooke, Gerald Cooke MR#:  [id number] DATE OF BIRTH:  14-Sep-1940  DATE OF ADMISSION:  01/05/2014  PRIMARY CARE PHYSICIAN:  Jaquelyn Bitter B. Brynda Greathouse, MD, at [facility]   ONCOLOGY: Janak K. Choksi, MD  VASCULAR: Katha Cabal, MD  CHIEF COMPLAINT: "I am not feeling well."   HISTORY OF PRESENT ILLNESS: Gerald Cooke is a 74 year old gentleman with multiple complex medical problems, who has been known to our service from multiple admissions, comes to the Emergency Room with generalized weakness and not feeling well. The patient said he has not had much output through his colostomy. In the Emergency Room, the patient had very dark melanotic output with liquid output from the colostomy which was strongly heme-positive. His hemoglobin has dropped from 8.5 from 9th of June down to 7.1. The patient has had a history of slow gastrointestinal bleed in the past, requiring EGD which was done at [facility] that showed esophagitis, likely due to radiation therapy due to his lung cancer. The patient has had several blood transfusions in the past. He is being admitted for further evaluation and management. The patient has been complaining right lower extremity swelling for the last several days. He is being admitted for further evaluation and management.   PAST MEDICAL HISTORY: 1.  History of end-stage renal disease, on hemodialysis.  2.  Peptic ulcer disease/chronic esophagitis, chronic gastrointestinal bleed which is slow.  3.  Chronic anemia secondary to slow gastrointestinal bleed and due to renal failure and anemia of chronic disease.  4.  History of lung cancer, status post radiation. Follows with Dr. Oliva Bustard.  5.  Hypertension.  6.  High cholesterol.  7.  Colostomy.  8.  Ruptured abdominal aortic aneurysm in 2008.  9.  Status post colostomy secondary to complication from abdominal aortic aneurysm surgery.  10.  History of hyperlipidemia  11.  Gastroesophageal reflux disease.   12.   History of severe aortic stenosis.  13.  History of coronary artery disease, status post non-Q-wave myocardial infarction in the past.   ALLERGIES: HEPARIN, TETANUS TOXOID.   SOCIAL HISTORY: Lives currently [facility]. No history of smoking or drug use or alcohol use.   FAMILY HISTORY: From old records, positive for diabetes, hypertension and coronary artery disease.   MEDICATIONS:  1.  Sucralfate 2 grams b.i.d.  2.  Renvela 800 mg 3 tablets 3 times a day.  3.  Protonix 40 mg b.i.d.  4.  Oxycodone 5 mg 1 tablet every 4 to 6 hourly.  5.  Zofran 8 mg every 6 hours as needed.  6.  Fentanyl patch 12 mcg 1 patch every 72 hours.  7.  DuoNebs as needed every 6 hours.  8.  Combivent 1 puff b.i.d.  9.  Colace 50 mg b.i.d.   REVIEW OF SYSTEMS:    CONSTITUTIONAL: No fever. Positive for fatigue and weakness.  EYES: No blurred or double vision.  EARS, NOSE, THROAT: No tinnitus, ear pain, hearing loss or discharge.  RESPIRATORY: No cough. Positive for some shortness of breath. No hemoptysis.  CARDIOVASCULAR: No chest pain. Positive for shortness of breath and edema.  GASTROINTESTINAL: No nausea, vomiting, diarrhea, Positive for abdominal pain. Positive for rectal bleeding with heme-positive stools in the colostomy bag.  GENITOURINARY: No dysuria, hematuria or renal calculus.  ENDOCRINE: No polyuria, nocturia, or thyroid problems.  HEMATOLOGY:  Positive for chronic anemia. No easy bruising or bleeding.  SKIN: No acne, rash or lesion.  MUSCULOSKELETAL: Positive for arthritis.  No swelling or gout.  NEUROLOGIC: No cerebrovascular accident, transient ischemic attack or dementia.  PSYCHIATRIC: No anxiety, depression or insomnia.  All other systems reviewed and negative.   PHYSICAL EXAMINATION: GENERAL: The patient is awake, alert, oriented x 3, not in acute distress.  VITAL SIGNS:  Afebrile, pulse is 107. Blood pressure 92/60, sats 99% on 2 liters.  HEENT: Atraumatic,  normocephalic. Pupils are equal, round and reactive to light and accommodation. EOM intact. Oral mucosa is moist.  NECK: Supple. No JVD. No carotid bruit.  LUNGS: Clear to auscultation bilaterally. No rales, rhonchi, respiratory distress or labored breathing. Decreased breath sounds at the bases.  HEART: Both the heart sounds are normal. Rate, rhythm is regular. PMI not lateralized. Chest is nontender. Good pedal pulses, good femoral pulses. No lower extremity edema.  ABDOMEN: Soft, benign. There is some tenderness, diffuse, present. Colostomy bag shows melanotic liquidy stool which is strongly heme-positive.  EXTREMITIES: The patient has significant edema on the right lower extremity up to the calf, pitting in nature. Left lower extremity good pedal pulses, good femoral pulses. No lower extremity edema on the left.  NEUROLOGIC: The patient has generalized weakness. No focal neuro deficit. He is globally weak. Motor and sensory exam is essentially otherwise normal.  SKIN: Warm and dry.  PSYCHIATRIC: The patient is awake, alert, oriented x 3.   LABORATORY, DIAGNOSTIC AND RADIOLOGICAL DATA:   1.  CT of the abdomen and pelvis shows new small bilateral pleural effusion with right atelectasis, stable; calcified pleural plaques, increasing abdominopelvic ascites, no abdominopelvic mass or adenopathy.  2.  Chest x-ray shows diffuse interstitial opacities which could reflect edema or interstitial pneumonitis, similar appearance of right upper lobe volume loss, consolidation and central cavitation, bilateral pleural calcification.  3.  CK total is 15. Potassium is 4.2. Hemoglobin is 7.1, hematocrit is 22.8, white count is 11.9.  Lipase is 87.   ASSESSMENT AND PLAN: A 74 year old, Gerald Cooke, with history of right upper lobe lung cancer with chronic anemia, chronic slow gastrointestinal bleed, comes in with:  1.  Increasing shortness of breath, fatigue, generalized weakness and hemoglobin down to 7.1  with strongly positive Hemoccult colostomy output. The patient is going to be admitted with acute on chronic anemia with possible slow gastrointestinal bleed. Has he had history of  radiation esophagitis which was evaluated by endoscopy done at [facility] in February 2015. The patient will be admitted on the medical floor. Will give 1 unit of blood transfusion. Risks and benefits of transfusion were discussed with the patient, who is agreeable to it. Consent obtained by ER physician. Gastroenterology consultation by Dr. Vira Agar. We will continue IV Protonix b.i.d. along with Carafate. Transfuse as needed. Avoid nonsteroidal anti-inflammatory drugs or any other blood thinners including heparin for deep vein thrombosis prophylaxis.  2.  End-stage renal disease, on hemodialysis. Will have nephrology consultation for hemodialysis.  3.  Shortness of breath, probably has underlying some fluid build-up. Will consider ultrafiltration at hemodialysis. His sats are 93% to 94% on 2 liters nasal cannula oxygen. The patient currently is not in respiratory, distress. We will continue his breathing treatments. He has underlying chronic x-ray changes as noted on the chest x-ray.  4.  Hypertension. The  patient has relatively low blood pressure, will continue to monitor. The patient currently is not on any antihypertensive medication.  5.  Deep vein thrombosis prophylaxis. Avoid antiplatelet agent given anemia and possible gastrointestinal bleed.  6.  CODE STATUS: The patient carries a MOST  form. He is a limited code. The above was discussed with the patient and the patient's sister, who in the room.  7.  Further workup according to the patient's clinical course.   TIME SPENT: 50 minutes.    ____________________________ Hart Rochester Posey Pronto, MD sap:cs D: 01/05/2014 19:16:13 ET T: 01/05/2014 19:49:14 ET JOB#: [id number]  cc: Amyre Segundo A. Posey Pronto, MD, <Dictator> Ilda Basset MD ELECTRONICALLY SIGNED 01/23/2014 10:32

## 2014-11-13 NOTE — Consult Note (Signed)
Chief Complaint:  Subjective/Chief Complaint seen for GI bleeding /melena.  no melena or evidence of ongoing GI bleeding for 2-3 days.  mile generalized discomfort, no nausea. tolerating po.   VITAL SIGNS/ANCILLARY NOTES: **Vital Signs.:   21-Jun-15 13:35  Vital Signs Type Routine  Temperature Temperature (F) 97.9  Celsius 36.6  Pulse Pulse 96  Respirations Respirations 19  Systolic BP Systolic BP 175  Diastolic BP (mmHg) Diastolic BP (mmHg) 66  Mean BP 79  Pulse Ox % Pulse Ox % 98  Pulse Ox Activity Level  At rest  Oxygen Delivery 2L   Brief Assessment:  Cardiac Regular   Respiratory coarse rhonchi/wheezes   Gastrointestinal details normal Soft  Nontender  Nondistended  Bowel sounds normal   Lab Results: Routine BB:  21-Jun-15 13:33   ABO Group + Rh Type B Positive  Antibody Screen NEGATIVE (Result(s) reported on 10 Jan 2014 at 02:43PM.)  Crossmatch Unit 1 Ready (Result(s) reported on 10 Jan 2014 at 02:43PM.)  Routine Chem:  16-Jun-15 15:31   BUN  52  Creatinine (comp)  5.94  18-Jun-15 01:07   BUN  31  Creatinine (comp)  4.06  19-Jun-15 04:16   BUN  42  Creatinine (comp)  5.12  21-Jun-15 05:24   Glucose, Serum 91  BUN  37  Creatinine (comp)  4.29  Sodium, Serum 139  Potassium, Serum 4.1  Chloride, Serum 101  CO2, Serum 30  Calcium (Total), Serum 9.0  Anion Gap 8  Osmolality (calc) 286  eGFR (African American)  15  eGFR (Non-African American)  13 (eGFR values <19m/min/1.73 m2 may be an indication of chronic kidney disease (CKD). Calculated eGFR is useful in patients with stable renal function. The eGFR calculation will not be reliable in acutely ill patients when serum creatinine is changing rapidly. It is not useful in  patients on dialysis. The eGFR calculation may not be applicable to patients at the low and high extremes of body sizes, pregnant women, and vegetarians.)  Routine Hem:  16-Jun-15 15:31   Hemoglobin (CBC)  7.1  17-Jun-15 00:48    Hemoglobin (CBC)  6.0 (Result(s) reported on 06 Jan 2014 at 01:14AM.)    04:10   Hemoglobin (CBC)  8.5    09:50   Hemoglobin (CBC)  9.0 (Result(s) reported on 06 Jan 2014 at 10:20AM.)    19:12   Hemoglobin (CBC)  7.5 (Result(s) reported on 06 Jan 2014 at 08:07PM.)  18-Jun-15 01:07   Hemoglobin (CBC)  9.1  Hemoglobin (CBC) -  19-Jun-15 04:16   Hemoglobin (CBC)  8.7  20-Jun-15 04:55   Hemoglobin (CBC)  8.6  21-Jun-15 05:24   WBC (CBC)  11.7  RBC (CBC)  2.59  Hemoglobin (CBC)  7.6  Hematocrit (CBC)  24.6  Platelet Count (CBC)  141  MCV 95  MCH 29.4  MCHC  31.0  RDW  18.7  Neutrophil % 87.4  Lymphocyte % 1.6  Monocyte % 8.7  Eosinophil % 1.9  Basophil % 0.4  Neutrophil #  10.2  Lymphocyte #  0.2  Monocyte # 1.0  Eosinophil # 0.2  Basophil # 0.0 (Result(s) reported on 10 Jan 2014 at 06:10AM.)   Assessment/Plan:  Assessment/Plan:  Assessment 1) melena of uncertain etiology-not recurrent, hempdynamically stable. no EGD done for anesthesia risk in this patient with multiple organ system disease and metastatic lung cancer.  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  Continue carafate.  Will check stool for H. pylori.  Dr Candace Cruise to be back tomorrow.   Electronic Signatures: Loistine Simas (MD)  (Signed 21-Jun-15 15:43)  Authored: Chief Complaint, VITAL SIGNS/ANCILLARY NOTES, Brief Assessment, Lab Results, Assessment/Plan   Last Updated: 21-Jun-15 15:43 by Loistine Simas (MD)

## 2014-11-13 NOTE — Consult Note (Signed)
PATIENT NAME:  Gerald Cooke, Gerald Cooke MR#:  [id number] DATE OF BIRTH:  March 21, 1941  CARDIAC CONSULTATION   DATE OF CONSULTATION:  08/21/2013  CONSULTING PHYSICIAN:  Muhammad A. Fletcher Anon, MD  PRIMARY CARE PHYSICIAN: Eduard Clos. Gilford Rile, MD  PRIMARY CARDIOLOGIST: Minna Merritts, MD  ONCOLOGIST: Martie Lee. Choksi, MD  NEPHROLOGIST: Tama High, MD   REASON FOR CONSULTATION: Myocardial infarction.   HISTORY OF PRESENT ILLNESS: This is a 74 year old male with known history of end-stage renal disease, on hemodialysis on Monday, Wednesday, Friday, stage IIIB lung cancer which has been in remission, but was noted to have a new lung mass recently, pending work-up, history of aortic aneurysm repair with multiple complications in 9147. He also has history of supraventricular tachycardia. He presented with substernal burning sensation which has been intermittent over the last 2 days, but lasted all day yesterday. He was found to have mildly elevated troponin. EKG was significantly abnormal with 3 mm of downsloping ST depression in the inferior and anterolateral leads, with 3 mm of ST elevation in aVR. The patient also reports dark stool over the last few weeks with no frank blood. He has noticed increased exertional dyspnea.   PAST MEDICAL HISTORY:  1. End-stage renal disease, on hemodialysis.  2. Stage IIIB lung cancer with possible recurrence. He is status post chemotherapy and radiation.  3. Hypertension.  4. History of ruptured aortic aneurysm, status post repair in 2008.  5. Gastroesophageal reflux disease.  6. Hyperlipidemia.  7. Supraventricular tachycardia.  8. Mild to moderate aortic stenosis by echocardiogram in 2013.   SOCIAL HISTORY: Remarkable for remote tobacco use. He denies any alcohol or recreational drug use.   FAMILY HISTORY: Remarkable for coronary artery disease.   ALLERGIES: INCLUDE HEPARIN, WHICH CAUSED HIM TO HAVE SEVERE HYPOTENSION, TETANUS TOXOID.   REVIEW OF SYSTEMS: A  10-point review of systems was performed. It is negative other than what is mentioned in the HPI.   HOME MEDICATIONS: Include aspirin 81 mg daily, Zofran 4 mg daily, atorvastatin 20 mg daily, metoprolol 25 mg twice daily, Norvasc 10 mg once daily, Renvela 800 mg 3 tablets 3 times daily.   PHYSICAL EXAMINATION:  GENERAL: The patient appears to be older than his stated age, but in no acute distress.  VITAL SIGNS: Temperature is 98.1, pulse is 103, respiratory rate is 16, blood pressure is 135/76 and oxygen saturation is 97% on room air.  HEENT: Normocephalic, atraumatic.  NECK: No JVD or carotid bruits, although he does have a radiated murmur to both carotid arteries.  CARDIOVASCULAR: Normal PMI. Normal S1 and S2 with no gallops. There is a 3/6 systolic ejection murmur at the aortic area which is late peaking, with very diminished S2.  RESPIRATORY: Normal respiratory effort with no use of accessory muscles. Auscultation reveals normal breath sounds.  ABDOMEN: Benign, nontender, nondistended.  EXTREMITIES: With no clubbing, cyanosis or edema.  SKIN: Warm and dry with no rash.  PSYCHIATRIC: He is alert, oriented x3 with normal mood and affect.   LABORATORY AND DIAGNOSTIC DATA: Creatinine was 5.28. Hemoglobin was 8.2. His previous hemoglobin was 10.5 in November. Troponin initially was 0.10 and subsequently increased to 0.32. EKG is as outlined above.   IMPRESSION:  1. Non-ST elevation myocardial infarction with significantly abnormal ECG.  2. Aortic stenosis which is at least moderate by physical exam.  3. End-stage renal disease, on hemodialysis.  4. Probable gastrointestinal bleed.  5. Stage IIIB lung cancer with possible recurrence.   RECOMMENDATIONS: The patient's presentation is  consistent with non-ST elevation myocardial infarction. His ECG is significantly abnormal, with significant ST depression as well as ST elevation in aVR, suggestive of 3-vessel disease or left main stenosis. HE IS  TRULY ALLERGIC TO HEPARIN, and thus this cannot be given at this point. Other anticoagulation is likely contraindicated with his drop in his hemoglobin. I recommend proceeding with an echocardiogram to evaluate his aortic stenosis. It is probably best to proceed with at least diagnostic cardiac catheterization in order to risk stratify him in terms of management. I recommend proceeding with cardiac catheterization today. Risks, benefits and alternatives were discussed with the patient. I also recommend GI consultation for suspected GI bleed. Nephrology consultation is needed for dialysis. I also recommend consulting Dr. Oliva Bustard from oncology to give [place] an idea about his long-term prognosis. It is highly possible that the patient might need surgical revascularization for his coronary artery disease and aortic stenosis. Before proceeding with that, we will have to determine his prognosis from a cancer standpoint.   ____________________________ Mertie Clause. Fletcher Anon, MD maa:lb D: 08/21/2013 08:35:40 ET T: 08/21/2013 09:26:36 ET JOB#: [id number]  cc: Muhammad A. Fletcher Anon, MD, <Dictator> Wellington Hampshire MD ELECTRONICALLY SIGNED 10/01/2013 10:04

## 2014-11-13 NOTE — Discharge Summary (Signed)
PATIENT NAME:  Gerald, Cooke MR#:  [id number] DATE OF BIRTH:  06/08/1941  DATE OF ADMISSION:  11/30/2013 DATE OF DISCHARGE:  12/03/2013  CONSULTANTS: Dr. Ermalinda Memos from palliative care, Dr. Oliva Bustard, and Dr. Holley Raring, as well as Dr. Acie Fredrickson from cardiology.   PRIMARY CARE PHYSICIAN: Dr. Ronette Deter  CHIEF COMPLAINT: Abdominal pain.   FINAL DIAGNOSES: 1. Abdominal pain likely from metastatic cancer.  2. Positive troponin, likely demand ischemia in the setting of renal disease and severe aortic stenosis.  3. End-stage renal disease on dialysis.  4. Metastatic, likely stage IV now, lung cancer.  5. History of coronary artery disease status post cardiac catheterization on January 30th, found with critical aortic stenosis.  6. Hypertension.  7. History of ruptured abdominal aortic aneurysm in 2008.  8. Gastroesophageal reflux disease.  9. Hyperlipidemia.  10. Radiation esophagitis.  11. Anemia of chronic disease.   DISCHARGE MEDICATIONS: Renvela carbonate 800 mg 3 tablets 3 times a day, Combivent 1 puff 2 times a day as needed, metoprolol tartrate 25 mg 1 tablet once a day, additional tablet in the  evening if blood pressures over 160.  Ondansetron 8 mg 1 tablet 2 times a day as needed for nausea, vomiting, pantoprazole 40 mg 2 times a day, sucralfate 2 grams 2 times a day, Visine ophthalmic 1 drop to each eye 2 times a day as needed, fentanyl patch 12 mcg every 72 hours.   DISPOSITION: Home.   DIET: Low sodium, renal diet.  ACTIVITY:  As tolerated.   FOLLOWUP: Please follow with PCP within 1-2 weeks and follow at dialysis center at your next scheduled dialysis. Please follow with Dr. Oliva Bustard on next Tuesday.  SIGNIFICANT LABS AND IMAGING: Initial BUN 57, creatinine of 8.5, last creatinine of 4.06. Initial troponin 1.4, then 1.4, then 1.2. CK-MB components were negative x 3. Initial white count of 9.9. Ascites fluid showing nucleated cells of 1315, 35% neutrophils, 25% lymphocytes, 39%  monocytes, and then the fluid culture is pending, and per Dr. Oliva Bustard, there are malignant cells and ascitic fluid. CT abdomen and pelvis without contrast showing no evidence of bowel obstruction, mild diffuse abdominal and pelvic ascites with nodular infiltration of the omentum and retroperitoneal lymphadenopathy suggesting possible metastatic disease, small inguinal hernia, bilateral renal atrophy. Ultrasound-guided paracentesis with 300 mL of clear fluid removed.   HISTORY OF PRESENT ILLNESS AND HOSPITAL COURSE: For full details of the H and P, please see the dictation on  May 11th by Dr. Margaretmary Eddy, but briefly, this is a 74 year old male with a history of critical aortic stenosis, CAD, who is being evaluated by his cardiologist for aortic valve replacement who has stage III cancer and the lungs with metastasis status post radiation and chemotherapy in the past, end-stage renal disease on dialysis who comes in for abdominal pain, which had been going on for the past 3 days without nausea, vomiting, or diarrhea. There is some constipation. A CAT scan was done in the ER without evidence of bowel obstruction and the case was discussed with on-call surgeon who recommended medical admission. He was admitted to our service and a CT was also noted to be concerning for possible metastases. He was initially kept n.p.o. then started on some gentle fluids and diet advanced. The patient underwent an ultrasound-guided paracentesis and the cytology has come back with evidence for malignant cells per oncology. On abdominal exam, he has had no significant tenderness to palpation. No significant rebound, guarding, or peritoneal signs. His nucleated cells are elevated, but  I suspect that this is likely due to metastases. He has not been started on antibiotics and he is tolerating his diets. He was seen by cardiology for the positive troponin, which is likely demand ischemia in the setting of critical aortic stenosis and renal  disease. He was seen by Dr. Acie Fredrickson. Of note, per cardiology, if indeed there are metastases to the abdomen he did not believe that the patient is a candidate for TAVR or any other invasive cardiology procedures which is concerning as he does have critical aortic stenosis and he was under study for his aortic valve TAVR. At this point, he has had no significant shortness of breath, chest pains, or significant abdominal pain. He is tolerating his diet. He was started on fentanyl patch for pain control and he has been notified of abdominal findings and he is to follow with Dr. Oliva Bustard as an outpatient. He was also seen by palliative care who can also follow the patient as an outpatient.   PHYSICAL EXAMINATION:  VITAL SIGNS: On the day of discharge, temperature is 98, pulse rate 97, respiratory rate 20, blood pressure 113/72, oxygen saturation 95%.  GENERAL: The patient is an elderly male lying in bed in no obvious distress, watching TV. HEENT: Normocephalic, atraumatic. Normal S1, S2 with a high-grade murmur at aortic region. The patient does have a colostomy bag without significant tenderness or rebound or guarding.  ABDOMEN:  Soft. No pitting edema.   TOTAL TIME SPENT: 35 minutes.  CODE STATUS:  This patient is full code at the moment.    ____________________________ Vivien Presto, MD sa:dd D: 12/03/2013 16:41:33 ET T: 12/03/2013 19:16:04 ET JOB#: [id number]  cc: Vivien Presto, MD, <Dictator> Martie Lee. Oliva Bustard, MD Minna Merritts, MD [place] Lilian Kapur, MD Eduard Clos Gilford Rile, MD Vivien Presto MD ELECTRONICALLY SIGNED 12/31/2013 10:50

## 2014-11-13 NOTE — Discharge Summary (Signed)
PATIENT NAME:  Gerald Cooke, Gerald Cooke MR#:  [id number] DATE OF BIRTH:  1941-05-17  DATE OF ADMISSION:  08/21/2013 DATE OF DISCHARGE:  08/24/2013  DIAGNOSES PRESENTLY:  As follows:  Non-ST-elevation myocardial infarction, status post cardiac catheterization showing left main disease. Severe aortic valve stenosis. End-stage renal disease, on hemodialysis. 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 Melena with a history of gastroesophageal reflux disease, hypertension. Stage 3B lung cancer, currently in remission.   [facility] COURSE: Dr. Kathlyn Sacramento from cardiology. Dr. Delight Hoh from oncology. Dr. Lucilla Lame from gastroenterology.   PERTINENT STUDIES DONE DURING THE HOSPITAL COURSE: Are as follows:  1.  Cardiac catheterization done on August 21, 2013, showing significant left main disease, severe aortic valve stenosis, and ejection fraction of 45%.  2.  A 2-dimensional echocardiogram done on January 30th showing LV ejection fraction to be 40% to 45%, mildly decreased global LV systolic function, severe aortic valve stenosis, mild mitral valve regurgitation, moderate tricuspid regurgitation, moderate pulmonic valve stenosis.   HOSPITAL COURSE: This is a 74 year old male with medical problems as mentioned above, presented to the hospital on August 21, 2013, due to shortness of breath and chest pain, noted to have an elevated troponin consistent with a non-ST elevation MI.   1.  Non-ST-elevation MI. This was the likely diagnosis when the patient presented to the hospital with chest pain, noted to have a significantly elevated troponin. His troponins peaked as high as 1.2. The patient was seen by cardiology, underwent cardiac catheterization on August 22, 2013, which showed left main disease with critical aortic valve stenosis. The patient currently is chest pain-free, hemodynamically stable, is currently being managed medically on aspirin, statin and low-dose beta blocker. The  patient is being transferred to [facility] for evaluation and possible coronary artery bypass graft surgery.  2.  Severe aortic valve stenosis. This was also noted on echocardiogram and also on the cardiac catheterization. The patient likely would also benefit from aortic valve replacement and this is to be further addressed at the [facility].  3.  End-stage renal disease, on hemodialysis. The patient gets dialysis on Monday, Wednesday, Friday. He was maintained on his dialysis schedule. There were no acute issues related to his end-stage renal disease presently.  4.  Melena with anemia. The patient does have chronic anemia given his chronic kidney disease although he was noted to have melanotic stools. Gastroenterology consult was obtained. They did not perform any endoscopic evaluation at this time. The patient was transfused 1 unit of packed red blood cells while in the hospital. His hemoglobin on the day of discharge is 8.1. This further needs to be followed. The patient is being maintained on proton pump inhibitor b.i.d. at this point.  5.  Hypertension. The patient remained hemodynamically stable on his metoprolol and he will resume that.  6.  History of stage 3B lung cancer. The patient has a history of lung cancer.  It is currently in remission. He is followed by Dr. Oliva Bustard, had a PET scan done not too long ago which actually showed improvement from his previous CAT scan as per oncology.  There is no plan for any further treatment at this point.  7.  Anemia of chronic disease. This is likely secondary to his end-stage renal disease. The patient has received Epogen while in the hospital. He was also transfused 1 unit of packed red blood cells. His hemoglobin further needs to be followed. He is currently being optimized from the medical  standpoint to be evaluated for possible aortic valve replacement and coronary artery bypass graft surgery.   CURRENT MEDICATIONS: As follows: Tylenol  650 q.4 hours as needed, amlodipine 10 mg daily, aspirin 81 mg daily, atorvastatin 20 mg daily, metoprolol tartrate 25 mg t.i.d., Zofran 4 mg q.4 hours as needed, Protonix 40 mg b.i.d., Renvela 2400 mg t.i.d., sucralfate 1 gram q.i.d., Combivent inhaler 1 puff q.i.d. and Ambien 500 mg bedtime as needed.   CODE STATUS:  The patient is a full code.   DISPOSITION: He is being discharged to [facility] for evaluation for coronary artery bypass graft surgery and also aortic valve replacement.  TIME SPENT ON DISCHARGE: 45 minutes.  ____________________________ Belia Heman. Verdell Carmine, MD vjs:cs D: 08/24/2013 14:31:35 ET T: 08/24/2013 14:56:15 ET JOB#: [id number]  cc: Belia Heman. Verdell Carmine, MD, <Dictator> Henreitta Leber MD ELECTRONICALLY SIGNED 08/27/2013 11:49

## 2014-11-13 NOTE — Consult Note (Signed)
Patient unavailable for consultation at this time. Admitted with chest pain and non-ST elevation MI and reportedly having cardiac cath at some point today.  In regards to his lung cancer, will continue with the plan set forth by Dr. Oliva Bustard in December 2014 which was to repeat a PET scan in March or April 2015 to re-assess for progression of disease. consult to follow.  Electronic Signatures: Delight Hoh (MD)  (Signed on 30-Jan-15 13:05)  Authored  Last Updated: 30-Jan-15 13:05 by Delight Hoh (MD)

## 2014-11-13 NOTE — Consult Note (Signed)
Chief Complaint:  Subjective/Chief Complaint seen for blood in ostomy.  , small amount of black material in ostomy bag.  denies nausea or abdominal pain.   VITAL SIGNS/ANCILLARY NOTES: **Vital Signs.:   20-Jun-15 12:20  Vital Signs Type Routine  Temperature Temperature (F) 98.1  Pulse Pulse 104  Respirations Respirations 27  Systolic BP Systolic BP 161  Diastolic BP (mmHg) Diastolic BP (mmHg) 77  Mean BP 91  Pulse Ox % Pulse Ox % 97  Pulse Ox Activity Level  At rest  Oxygen Delivery 2L; Nasal Cannula   Brief Assessment:  Cardiac Regular   Respiratory rhonchi   Gastrointestinal details normal Soft  Nontender  Nondistended  ostomy noted small amount of black material, no red or marroon.   Lab Results: Routine Chem:  16-Jun-15 15:31   BUN  52  Creatinine (comp)  5.94  18-Jun-15 01:07   BUN  31  Creatinine (comp)  4.06  19-Jun-15 04:16   BUN  42  Creatinine (comp)  5.12  Routine Hem:  16-Jun-15 15:31   Hemoglobin (CBC)  7.1  Platelet Count (CBC) 161 (Result(s) reported on 05 Jan 2014 at 03:48PM.)  17-Jun-15 00:48   Hemoglobin (CBC)  6.0 (Result(s) reported on 06 Jan 2014 at 01:14AM.)    04:10   Hemoglobin (CBC)  8.5  Platelet Count (CBC)  136    09:50   Hemoglobin (CBC)  9.0 (Result(s) reported on 06 Jan 2014 at 10:20AM.)    19:12   Hemoglobin (CBC)  7.5 (Result(s) reported on 06 Jan 2014 at 08:07PM.)  18-Jun-15 01:07   Hemoglobin (CBC)  9.1  Hemoglobin (CBC) -  Platelet Count (CBC)  143  Platelet Count (CBC) -  19-Jun-15 04:16   Hemoglobin (CBC)  8.7  Platelet Count (CBC)  139  20-Jun-15 04:55   Hemoglobin (CBC)  8.6   Assessment/Plan:  Assessment/Plan:  Assessment 1) GI bleeding-stable, no evidence of bleeding for several days.  High risk for anesthesia, no scope planned.   Plan 1) advance diet to low residue 2) daily hgb, tfx as needed.   Electronic Signatures: Loistine Simas (MD)  (Signed 20-Jun-15 16:21)  Authored: Chief Complaint, VITAL  SIGNS/ANCILLARY NOTES, Brief Assessment, Lab Results, Assessment/Plan   Last Updated: 20-Jun-15 16:21 by Loistine Simas (MD)

## 2014-11-13 NOTE — Consult Note (Signed)
PATIENT NAME:  Gerald Cooke, Gerald Cooke MR#:  [id number] DATE OF BIRTH:  Dec 26, 1940  DATE OF CONSULTATION:  08/21/2013  REFERRING PHYSICIAN:  Dr. Max Sane  CONSULTING PHYSICIAN:  Andria Meuse, NP  CONSULTING GASTROENTEROLOGIST:  Dr. Lucilla Lame.   CARDIOLOGIST:  Dr. Rockey Situ.  ONCOLOGIST:  Dr. Oliva Bustard.  NEPHROLOGIST:  Dr. Holley Raring.   PRIMARY CARE PHYSICIAN:  Dr. Ronette Deter.   REASON FOR CONSULTATION:  Melena and chest pain.   HISTORY OF PRESENT ILLNESS:  Gerald Cooke is a pleasant 74 year old Caucasian male with a history of stage IIIB lung cancer followed by Dr. 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 Holley Raring, and anemia of chronic disease. He also has a history of chronic GERD, which is intermittent. He has had melena intermittently over the last week and he also notes dark stools for greater than a month as well. He has had severe indigestion the last 2 nights when he tried to go to sleep and it has kept him awake. At home, he takes over-the-counter Nexium b.i.d., ranitidine, and Rolaids p.r.n. but does not take PPI or any of these meds on a daily basis. He may take a couple of pills per month. He has had nausea and vomited on a couple of occasions. He has severe chest pain, which he describes as " indigestion". He denies any lower abdominal pain, dysphagia or odynophagia. He denies any diarrhea or recent constipation. He denies any new medications, denies any history of known blood transfusions. He had ST depression and elevation suggestive of a 3-vessel or left main disease and is followed by Dr. Fletcher Anon. He has an echo and cardiac cath scheduled for today. He was diagnosed with lung cancer in 2008. 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 He is scheduled to followup with a recheck with Dr. Oliva Bustard in April. His last chemoradiation therapy and radiation was several years ago. He does take aspirin 81 mg daily at home and  denies any NSAID use. He is status post colostomy with 2008 aortic aneurysm repair. He has been on dialysis Monday, Wednesday, Friday since that time. His hemoglobin dropped from 10.27 May 2013 to 8.2 this admission. He had a chest x-ray, which showed right upper lobe cavitary mass consolidation and bilateral calcified pleural plaques.   PAST MEDICAL AND SURGICAL HISTORY:  1.  Chronic kidney disease on hemodialysis Monday, Wednesday, Friday.  2.  Stage IIIB lung cancer status post chemoradiation followed by Dr. Oliva Bustard.  3.  Hypertension. 4.  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  5.  Chronic GERD.  6.  Hyperlipidemia.  7.  Nephrectomy.  8.  Aortic stenosis.   MEDICATIONS PRIOR TO ADMISSION: 1.  Amlodipine 10 mg daily.  2.  Aspirin 81 mg daily. 3.  Atorvastatin 20 mg daily.  4.  Metoprolol 25 mg b.i.d. 5.  Ondansetron 4 mg q.6 hours p.r.n. nausea and vomiting.  6.  Renvela carbonate 800 mg daily 3 tablets 3 times a day with meals and 1 with snacks.   ALLERGIES:  HEPARIN caused hypotension; TETANUS caused arm swelling.   FAMILY HISTORY:  There is no known family history of colon carcinoma, liver or chronic GI problems. There is family history of coronary disease and MI and he has lost one son to an MI.   SOCIAL HISTORY:  He is married. He has one daughter, lost one son. He has a history of remote tobacco use. He denies any  alcohol or illicit drug use.   REVIEW OF SYSTEMS:  See HPI.  CONSTITUTIONAL:  He does have fatigue and weakness.  Otherwise negative 10-point review of systems.   PHYSICAL EXAMINATION:  VITAL SIGNS:  Temperature 98.1, pulse 103, respirations 16, blood pressure 156/76, O2 sat 97% on room air.  GENERAL:  He is a thin, elderly Caucasian male who is alert, oriented, pleasant, cooperative, in no acute distress. He is accompanied by his daughter.   HEENT:  Sclerae clear, anicteric. Conjunctivae pink. Oropharynx pink and moist without  any lesions.  NECK:  Supple without mass or thyromegaly.  CHEST:  Heart rate is regular. He is a 4/5 murmur noted.  LUNGS:  With decreased breath sounds bilaterally and scant expiratory wheezes. No acute distress.  ABDOMEN:  Positive bowel sounds. He has a stoma in the right upper quadrant, which pale red and without exudate. The bag is clear. Soft, nontender, nondistended, without palpable mass or hepatosplenomegaly. No rebound, tenderness or guarding.  EXTREMITIES:  Without edema. He does have clubbing.  SKIN:  Pink, pale, warm and dry. NEUROLOGIC:  Grossly intact.  MUSCULOSKELETAL:  Good equal movement and strength bilaterally.  PSYCHIATRIC:  Normal mood and affect.   LABORATORY STUDIES:  BUN 53, creatinine 5.28, otherwise normal MET-7. AST 8, otherwise normal LFTs. Troponins were positive x 2, CK-MB was 4.7. His total CK was normal. His white blood cell count and platelets were normal. He had an INR of 1.   IMPRESSION:  Gerald Cooke is a very pleasant 74 year old Caucasian male with a history of chronic kidney disease on dialysis, stage IIIB lung cancer followed by Dr. Oliva Bustard, anemia of chronic disease and intermittent gastroesophageal reflux disease, who presents with over 64-monthhistory of intermittent melena and chest pain. Hemoglobin has dropped from 10.5 to 8.2 in 2 months. He has had EKG changes and elevated troponin suggestive of cardiac disease and had echo this morning with cardiac cath pending today followed by Dr. AFletcher Anon He is status post [facility]ostomy [facility]. The differentials include peptic ulcer disease, gastritis or arteriovenous malformations. Chest pain may also be due to advancing chest mass and tumor load. The patient is not stable for gastrointestinal evaluation at this time. If Cardiology and Oncology deem appropriate, we would consider esophagogastroduodenoscopy with Dr. WAllen Norrisonce stable. In the interim, we will change  proton pump inhibitor to IV b.i.d. since he has nausea. Continue anti-antiemetics and add Carafate. Continue supportive measures. Follow hemoglobin and hematocrit.   PLAN:  1.  CBC in the morning.  2.  Protonix 40 mg IV b.i.d.  3.  Carafate 1 gram before meals q.i.d.  4.  Follow up after Cardiology and Oncology evaluation.  5.  Dr. WAllen Norrisis on call this weekend.  6.  Continue supportive measures.   We would like to thank you for allowing [facility] participate in the care of Gerald Cooke.   ____________________________ KAndria Meuse NP klj:jm D: 08/21/2013 10:55:41 ET T: 08/21/2013 11:26:06 ET JOB#: [id number] cc: KAndria Meuse NP, <Dictator> DLucilla Lame MD Vipul S. SManuella Ghazi MD TMinna Merritts MD Munsoor NLilian Kapur MD JEduard ClosWGilford Rile MD KAndria MeuseFNP ELECTRONICALLY SIGNED 08/24/2013 11:26

## 2014-11-13 NOTE — Consult Note (Signed)
Chief Complaint:  Subjective/Chief Complaint Getting hemodialysis. No further bleeding from ostomy bag. Hgb stable. Reviewed EGD report from [facility] done on 4/9. Had radiation esophagits affecting proximal esophagus. Friable mucosa in cardia. Unclear as to the source. No active bleeding then.   VITAL SIGNS/ANCILLARY NOTES: **Vital Signs.:   22-Jun-15 08:49  Vital Signs Type Routine  Temperature Temperature (F) 97.5  Celsius 36.3  Temperature Source oral  Pulse Pulse 98  Pulse source if not from Vital Sign Device per Telemetry Clerk  Systolic BP Systolic BP 175  Diastolic BP (mmHg) Diastolic BP (mmHg) 64  Mean BP 76  Pulse Ox % Pulse Ox % 100  Pulse Ox Activity Level  At rest  Oxygen Delivery 4L; Nasal Cannula  Telemetry pattern Cardiac Rhythm Normal sinus rhythm; pattern reported by Telemetry Clerk   Brief Assessment:  GEN no acute distress   Cardiac Regular   Respiratory clear BS   Gastrointestinal No blood per ostomy   Lab Results:  Hepatic:  22-Jun-15 05:54   Albumin, Serum  1.8  Routine Chem:  22-Jun-15 05:54   Glucose, Serum 88  BUN  49  Creatinine (comp)  5.07  Sodium, Serum 139  Potassium, Serum 4.4  Chloride, Serum 104  CO2, Serum 28  Calcium (Total), Serum 9.4  Phosphorus, Serum 4.1  Anion Gap 7  Osmolality (calc) 290  eGFR (African American)  12  eGFR (Non-African American)  11 (eGFR values <82m/min/1.73 m2 may be an indication of chronic kidney disease (CKD). Calculated eGFR is useful in patients with stable renal function. The eGFR calculation will not be reliable in acutely ill patients when serum creatinine is changing rapidly. It is not useful in  patients on dialysis. The eGFR calculation may not be applicable to patients at the low and high extremes of body sizes, pregnant women, and vegetarians.)  Routine Hem:  22-Jun-15 05:54   WBC (CBC)  13.3  RBC (CBC)  3.02  Hemoglobin (CBC)  9.0  Hematocrit (CBC)  27.9  Platelet Count (CBC)   149  MCV 92  MCH 29.6  MCHC 32.1  RDW  19.8  Neutrophil % 88.8  Lymphocyte % 1.7  Monocyte % 7.5  Eosinophil % 1.6  Basophil % 0.4  Neutrophil #  11.8  Lymphocyte #  0.2  Monocyte # 1.0  Eosinophil # 0.2  Basophil # 0.1 (Result(s) reported on 11 Jan 2014 at 06:24AM.)   Assessment/Plan:  Assessment/Plan:  Assessment UGI bleeding. Stopped.   Plan No EGD as per anesthesia. Continue po protonix bid/carafate. Will sign off. Thanks.   Electronic Signatures: OVerdie Shire(MD)  (Signed 22-Jun-15 10:29)  Authored: Chief Complaint, VITAL SIGNS/ANCILLARY NOTES, Brief Assessment, Lab Results, Assessment/Plan   Last Updated: 22-Jun-15 10:29 by OVerdie Shire(MD)

## 2014-11-13 NOTE — Discharge Summary (Signed)
PATIENT NAME:  Gerald Cooke, Gerald Cooke MR#:  [id number] DATE OF BIRTH:  Nov 26, 1940  DATE OF ADMISSION:  09/18/2013 DATE OF DISCHARGE:  09/18/2013  PRIMARY CARE PHYSICIAN:  Dr. Ronette Deter.  CARDIOLOGY: Dr. Rockey Situ.  ONCOLOGY: Dr. Oliva Bustard.   CHIEF COMPLAINT: Ongoing melena.   DISCHARGE DIAGNOSES: 1.  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 2.  Esophagitis status post recent esophagogastroduodenoscopy early part of February at [facility] with cauterization.  Esophagogastroduodenoscopy was done by Dr. Paulita Fujita. GI at [facility].  3.  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  4.  Coronary artery disease status post cardiac catheter diagnosed with non-ST myocardial infarction on January 30 with critical aortic stenosis. Cardiac catheter was done by Dr. Fletcher Anon 08/21/2013. 5.  End-stage renal disease on hemodialysis.  6.  Stage IIIB lung cancer status post chemotherapy and radiation.  7.  Hypertension. However, the patient at present has relative hypotension.  8.  History of ruptured abdominal aortic aneurysm.  9.  Hyperlipidemia.  10.  Gastroesophageal reflux disease.   CONDITION ON DISCHARGE: Fair.  VITAL SIGNS:  Pulse is 102, blood pressure 108/67, sats 97% on room air.   LABORATORY DATA:  Today 7.8 hemoglobin, phosphorus 3.2. UA negative for UTI. Glucose is 90. BUN is 139. Potassium is 4.9. Chloride is 108. Bicarbonate is 25. Calcium is 8.2. LFTs normal. Albumin is 2.7. Troponin is 0.12. EKG shows bifascicular block. Nephrology consultation with Dr. Candiss Norse.  BRIEF SUMMARY OF HOSPITAL COURSE: Gerald Cooke is a 74 year old Caucasian gentleman with multiple medical problems including coronary artery disease, history of severe AS, end-stage renal disease on hemodialysis with acute on chronic anemia with history of ongoing melena, comes in with: 1.  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 The patient had EGD at [facility] in early part of February, was found to have radiation esophagitis and, per patient, underwent cauterization. EGD was done by Dr. Paulita Fujita.  The patient was placed on Protonix and was discharged to home. Cardiothoracic evaluation was done. He was discharged to home. The patient presented to [facility] with ongoing melena, was kept in the hospital for a couple of days and discharged back. He presents to yesterday, February 26, with similar symptoms of melena and came in with hemoglobin of 10.5 and today went down to 7.8. He is getting 1 unit of blood transfusion. I spoke with GI, Dr. Candace Cruise, and given complex cardiac issues with patient, GI advises the patient be transferred to tertiary care given complexity of his cardiac issues. Dr. Charlies Silvers from internal medicine at [facility] accepted the patient.  2.  Coronary artery disease with chronic elevated troponins, recent non-Q-wave myocardial infarction with severe aortic stenosis. The patient has seen cardiothoracic at [facility] during the February 1 week admission at [facility] and his coronary artery bypass graft is on hold until his gastrointestinal issues resolve. 3.  History of coronary artery disease. We will hold his aspirin for now. Continue beta blockers as his blood pressure allows.  4.  Hypotension. Blood pressure has been on the lower side. Hold his blood pressure medications at present.  5.  Gastroesophageal reflux disease. Continued proton pump inhibitor b.i.d.  6.  Hyperlipidemia. Continue statins.  DISCHARGE MEDICATIONS: 1.  Tylenol 650 mg p.o. q.4 p.r.n.  2.  Atorvastatin 40 mg at bedtime.  3.  Tessalon Perles 100 mg q.6 p.r.n.  4.  Zofran 4 mg  IV q.4 p.r.n.  5.  Protonix 40 mg IV b.i.d. 6.  Renvela 2400 mg b.i.d.  7.  Carafate 2 grams b.i.d.  8.  Tetrahydrozoline 0.05% ophthalmic drops 1 drop both eyes b.i.d. p.r.n. for dry eyes.  HOME MEDICATIONS:  1.  Amlodipine  2.5 mg. 2.  Aspirin 81 mg daily.  3.  Atorvastatin 40 mg daily at bedtime.  4.  Combivent Respimat 1 puff b.i.d.  5.  Metoprolol 25 mg daily.  6.  Zofran 8 mg b.i.d.  7.  Protonix 40 mg b.i.d.  8.  Renvela 800 mg 3 tablets 3 times a day.  9.  Carafate 2 grams b.i.d.  10.  Visine 1 drop to affected eyes daily.  TIME SPENT: 40 minutes.   ____________________________ Hart Rochester Posey Pronto, MD sap:ce D: 09/18/2013 15:02:29 ET T: 09/18/2013 15:39:59 ET JOB#: [id number]  cc: Anjel Pardo A. Posey Pronto, MD, <Dictator> Eduard Clos. Gilford Rile, MD Minna Merritts, MD Martie Lee. Oliva Bustard, MD Ilda Basset MD ELECTRONICALLY SIGNED 09/29/2013 15:23

## 2014-11-13 NOTE — Consult Note (Signed)
PATIENT NAME:  Gerald Cooke, Gerald Cooke MR#:  [id number] DATE OF BIRTH:  1941-05-14  DATE OF ADMISSION: 12/17/2013   DATE OF CONSULTATION:  12/17/2013  REFERRING PHYSICIAN:   CONSULTING PHYSICIAN:  Lupita Dawn. Mamye Bolds, MD  REASON FOR REFERRAL: Symptomatic anemia.   HISTORY OF PRESENT ILLNESS: The patient is a 74 year old white male with multiple medical history, including lung cancer, who has had radiation and chemotherapy recently. He also has end-stage renal disease, on hemodialysis. He has coronary artery disease and has severe aortic stenosis. He has ruptured AAA repair done in the past. He also has a colostomy.   He was recently hospitalized with abdominal pain and found to have retroperitoneal lymphadenopathy and nodular infiltration of the omentum seen on the CT scan. 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 He had an AV graft done on the left that was clotted. He then had to have a temporary right femoral access placed. He has had some bleeding from this site. When he came to the [place] on May 29th for chemotherapy, he was found to be symptomatic with weakness and was quite anemic with hemoglobin of only 6.9. Therefore, the patient was admitted for blood transfusion.   He apparently had a bleeding stomach ulcer approximately a month or so that required an upper endoscopy and cauterization elsewhere. Over the last day or two, he has noticed a little bit of blood coming from the colostomy bag. He denies any nausea, vomiting. There may be some mild abdominal discomfort. He has had no loss of appetite through all this, and he has been on medication once he was treated for ulcers.   PAST MEDICAL HISTORY: Extensive.  1. He does have a history of stage IIIB lung cancer.  2. He has end-stage renal disease and requires dialysis on Monday, Wednesday and Friday.  3. He has had cardiac catheterization and had an MI in the past.  4. Other history includes  ruptured abdominal aortic aneurysm in  2008.  5. Other history includes history of radiation esophagitis and gastric ulcers.  6. He also has anemia of chronic disease.   PAST SURGICAL HISTORY: Cardiac catheterization and AAA repair.   ALLERGIES: HE IS ALLERGIC TO HEPARIN, TETANUS AND DIPHTHERIA TOXOID.   SOCIAL HISTORY: He lives with his stepson. There is no smoking or alcohol history.   FAMILY HISTORY: Notable for coronary artery disease, diabetes and hypertension.   MEDICATIONS AT HOME: Include: 1. Fentanyl patch. 2. Renvela 2 tablet 3 times a day.  3. Oxycodone as needed.  4. Inhaler.  5. Metoprolol 25 mg in the morning and in the evening if the blood pressure is high.  Other medications include:  6. Protonix twice a day. 7. Carafate 2 grams twice a day.  8. Stool softeners.   REVIEW OF SYSTEMS:  CONSTITUTIONAL: There is some fatigue and weakness, but no fevers or chills.  HEAD AND NECK: There are no visual or hearing changes.  PULMONARY: There is some coughing and shortness of breath.  CARDIOVASCULAR: There is no chest pain or palpitations.  GASTROINTESTINAL: Again, there is no nausea, vomiting, diarrhea or constipation, but there was a little bit of blood through the colostomy bag.  The rest of the review of symptoms is negative.   PHYSICAL EXAMINATION:  GENERAL: The patient appears to be in no acute distress. He does look weak and elderly.  HEENT: Normocephalic, atraumatic head. Pupils are equally reactive. Throat was clear.  NECK: Supple.  CARDIAC: Regular rhythm and rate with a  systolic murmur.  LUNGS: Clear bilaterally.  ABDOMEN: Normoactive bowel sounds. Soft, nontender. There is no hepatomegaly. He has a colostomy bag present. I do not see any blood through the colostomy bag today right now.  EXTREMITIES: Show no clubbing, cyanosis or edema. The graft in the left is clotted off. There is a temporary femoral graft on the right with the dressing already soaked with blood.   LABORATORY DATA: His white count  was 14.8, hemoglobin was 6.9, platelet count is 243. Liver enzymes are normal. Sodium 141, potassium 4.6, chloride 102, CO2 28, BUN 67, creatinine is 5.44.   ASSESSMENT AND PLAN: This is a patient with multiple medical history, including lung cancer, coronary artery disease and chronic renal failure, who comes in with drop in hemoglobin to 6.9. Most recent hemoglobin was 9.8 on May 19th. The cause of his anemia is multifactorial, including cancer, renal failure, bleeding from his femoral catheter site. He does have a recent history of gastric ulcer and may have some bleeding from that already. However, the patient has been on Protonix and Carafate already. The patient will be getting his blood transfusion today. We will keep an eye on his colostomy bag and see whether the bleeding continues. If it continues and the hemoglobin keeps on falling, then we will consider doing an upper endoscopy to check for recurrent bleeding from the ulcers. Otherwise, we will just monitor him for now.   Thank you for the referral.   ____________________________ Lupita Dawn. Candace Cruise, MD pyo:lb D: 12/18/2013 07:52:00 ET T: 12/18/2013 08:04:22 ET JOB#: [id number]  cc: Lupita Dawn. Candace Cruise, MD, <Dictator> Lupita Dawn Idalia Allbritton MD ELECTRONICALLY SIGNED 12/18/2013 9:06

## 2014-11-13 NOTE — Op Note (Signed)
PATIENT NAME:  Gerald Cooke, Gerald Cooke MR#:  [id number] DATE OF BIRTH:  1941-02-23  DATE OF PROCEDURE:  01/15/2014  PREOPERATIVE DIAGNOSES:  1. End-stage renal disease.  2. 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  3. 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  4. Stage IV lung cancer.  5. Hypoxia.   POSTOPERATIVE DIAGNOSES: 1. End-stage renal disease.  2. 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  3. 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  4. Stage IV lung cancer.  5. Hypoxia.    PROCEDURE: Placement of right jugular 23-cm tip-to-cuff tunneled hemodialysis catheter through same venous access as previous temporary catheter, which was removed with the use of fluoroscopic guidance.   SURGEON: Dr. Lucky Cowboy.   ANESTHESIA: Local with moderate conscious sedation.   ESTIMATED BLOOD LOSS: Minimal.   INDICATION FOR PROCEDURE: A 74 year old gentleman with end-stage renal disease. He has had a temporary catheter in his right jugular and now needs this converted to a PermCath for discharge and more long term access. Risks and benefits were discussed. Informed consent was obtained.   DESCRIPTION OF PROCEDURE: The patient is brought to the vascular suite. Right neck, chest, and existing catheter were sterilely prepped and draped and a sterile surgical field was created. I anesthetized around the catheter and in the right subclavicular region as well as the tunnel location tunneled from the subclavicular region to the access site, a 23-cm tip-to-cuff tunneled hemodialysis catheter. I placed a wire through the existing catheter and removed the temporary catheter and placed the peel-away sheath. The wire and dilator were then removed. The PermCath was placed through the  peel-away sheath and the peel-away sheath was removed. The catheter tip was parked in the right atrium and both lumens withdrew blood well and flushed easily with sterile saline and tPA was used as well due to his reported heparin allergy. This was secured to the chest wall with 2 Prolene sutures. A 4-0 Monocryl pursestring suture was used to close the jugular access site and a 4-0 Monocryl pursestring suture was used around the catheter exit site in the subclavicular region. Sterile dressing was placed. The patient tolerated the procedure well and was taken to the recovery room in stable condition.   ALLERGY: REPORTED HEPARIN ALLERGY.    ____________________________ Algernon Huxley, MD jsd:lt D: 01/15/2014 16:12:00 ET T: 01/16/2014 06:41:24 ET JOB#: [id number]  cc: Algernon Huxley, MD, <Dictator> Algernon Huxley MD ELECTRONICALLY SIGNED 01/25/2014 15:10

## 2014-11-14 NOTE — Op Note (Signed)
PATIENT NAME:  Gerald Cooke, Gerald Cooke MR#:  [id number] DATE OF BIRTH:  July 05, 1941  DATE OF PROCEDURE:  12/25/2011  PREOPERATIVE DIAGNOSES:  1. Complication of vascular device.  2. Occlusion of left innominate vein.  3. Massive swelling with lymphedema and pain of the left upper extremity.   POSTOPERATIVE DIAGNOSES:   1. Complication of vascular device.  2. Occlusion of left innominate vein.  3. Massive swelling with lymphedema and pain of the left upper extremity.   PROCEDURES PERFORMED:  1. Removal of left IJ Infuse-a-Port.  2. Central venography with introduction catheter superior vena cava.  3. Percutaneous transluminal angioplasty central vein.   4. Placement of a 14 x 60 LIFESTAR stent.   SURGEON: Katha Cabal, MD  SEDATION: Versed 4 mg plus fentanyl 150 mcg administered IV. Continuous ECG, pulse oximetry and cardiopulmonary monitoring was performed throughout the entire procedure by the interventional radiology nurse. Total sedation time is one hour.   ACCESS: 6 French sheath left arm brachial axillary dialysis graft.   CONTRAST USED: Isovue 38 mL.   FLUORO TIME: Three minutes.   INDICATIONS: Mr. Borum is a 74 year old gentleman who presents with increasing painful swelling of his left arm and worsening difficulties with dialysis. He has known occlusion in the innominate vein associated with his port. I have conferred with his oncologist. They will not be using the port and therefore the plan is for removal of the port and intervention regarding the innominate vein. The risks and benefits were reviewed with the patient, all questions are answered, patient agrees to proceed.   DESCRIPTION OF PROCEDURE: Patient is taken to special procedures, placed in supine position. After adequate sedation is achieved, the patient's chest wall is prepped and draped in a sterile fashion. 1% lidocaine with epinephrine is infiltrated in the soft tissue surrounding the port and a linear incision is  created along the side of the port. Dissection carried down. The port is pulled from the pocket. The intravenous portion is also removed without difficulty. The port is intact. Light pressure is held for approximately five minutes and then the incision is closed using 3-0 Vicryl for the subcutaneous tissues and 4-0 Monocryl subcuticular and then Dermabond.   The patient is then repositioned with his left arm extended palm upward and the left arm is prepped and draped in sterile fashion. Access to the graft is obtained in the antegrade direction using micropuncture needle and subsequently the micro sheath is upsized to a 6 [place] sheath using a combination of a KMP catheter, a Glidewire and a Magic torque wire. The innominate vein is crossed. 3000 units of heparin is given and an 8 x 6 balloon is used to angioplasty the lesion. Follow-up angiography demonstrates minimal, really no improvement, and therefore a 14 x 60 LIFESTAR stent is opened onto the field and deployed across this lesion. It is post dilated with a 10 mm balloon. At this point the patient was having significant pain and therefore 12 mm balloon was not used. Follow-up angiography now demonstrates the innominate vein is widely patent with rapid flow of contrast and a significant decrease in the collaterals noted.   Pursestring suture of 4-0 Monocryl was placed around the sheath. Sheath is pulled, pressure is held, and there are no immediate complications.   INTERPRETATION: Initial views demonstrate the port still remains in good condition. It is removed intact.   Images of the central veins demonstrate occlusion of the innominate. There is patency of the superior vena cava. Following angioplasty  there is little to no improvement and therefore a stent is placed and post dilated to 10. A 12 mm inflation was not utilized secondary to patient expressing pain at the 10 mm inflation.   SUMMARY: Successful recanalization of the innominate vein as  described above.  ____________________________ Katha Cabal, MD ggs:cms D: 12/26/2011 09:47:23 ET T: 12/26/2011 10:00:51 ET  JOB#: [id number] cc: Katha Cabal, MD, <Dictator> Anderson [place] A. Gilford Rile, MD Martie Lee. Oliva Bustard, MD Katha Cabal MD ELECTRONICALLY SIGNED 01/01/2012 8:57

## 2014-11-14 NOTE — Op Note (Signed)
PATIENT NAME:  Gerald, Cooke MR#:  [id number] DATE OF BIRTH:  Jan 02, 1941  DATE OF PROCEDURE:  12/11/2011  PREOPERATIVE DIAGNOSES:  1. Complication AV dialysis graft.  2. Massive swelling left arm.  3. 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  4. Endstage renal disease requiring hemodialysis.   POSTOPERATIVE DIAGNOSES:   1. Complication AV dialysis graft.  2. Massive swelling left arm.  3. 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  4. Endstage renal disease requiring hemodialysis.   PROCEDURES PERFORMED:  1. Contrast injection left arm brachial axillary dialysis graft.  2. Introduction catheter to superior vena cava.   SURGEON:  Gerald Cooke, M.D.   SEDATION: Versed 2 mg plus fentanyl 50 mcg administered IV. Continuous ECG, pulse oximetry and cardiopulmonary monitoring was performed throughout the entire procedure by the interventional radiology nurse. Total sedation time was 45 minutes.   ACCESS: 5 French micro sheath left arm brachial axillary dialysis graft, antegrade direction.   CONTRAST USED: Isovue 35 mL.   FLUOROSCOPY TIME: 1.2 minutes.   INDICATIONS: Mr. Gerald Cooke is a 74 year old gentleman who has been having increasing difficulties with arm swelling. He has known stenosis of the central veins and has his AV graft on his side. The risks and benefits for evaluation and possible intervention were reviewed. The patient has agreed to proceed.   DESCRIPTION OF PROCEDURE: The patient is taken to Special Procedures and placed in the supine position. After adequate sedation is achieved, the left arm is extended palm upward and prepped and draped in sterile fashion. Given the amount of swelling his graft is difficult to appreciate and therefore ultrasound is placed in a sterile sleeve. Ultrasound is utilized secondary to lack of appropriate landmarks and to avoid vascular injury. Under direct ultrasound visualization, the  graft is identified 2 cm above the arterial anastomosis, and then accessed at this location with a micropuncture needle. Image is recorded for the permanent record and access is obtained with direct visualization. Microwire followed micro sheath, stopcock is placed. Hand injection of contrast is then used to demonstrate the graft as well as the central veins. Occlusion of the innominate vein with the Infuse-a-Port catheter passing through this area is noted extensive collaterals are noted including a hemizygous branch. There are extensive collaterals around the arm as well. This is likely the reason the graft has maintained its patency. Using a floppy Glidewire and a straight glide catheter, the occlusion in the innominate is negotiated. With the catheter in the superior vena cava, hand injection of contrast is used to demonstrate the superior vena cava is widely patent. The catheter is then negotiated into the innominate on the right and this does appear to show subclavian and jugular occlusions on the right.   The catheter is then removed.  Light pressure is held, and there are no immediate complications.   INTERPRETATION: The AV graft is patent and there are no hemodynamically significant stenoses identified. The proximal axillary vein and subclavian vein are widely patent. The innominate vein is occluded on the left just medial to the confluence of the jugular and subclavian veins. The superior vena cava is widely patent. The left-sided Infuse-a-Port is inserted through the jugular vein and the occlusion does appear to be surrounding the Infuse-a-Port.   SUMMARY: Occlusion of the innominate vein surrounding the Infuse-a-Port. At this point in time, multiple options do exist, but most of them entail removal of the port. Discussion with Dr. Oliva Cooke will be  undertaken to determine whether the patient is required to have a port reinserted. Subsequently the patient should undergo angioplasty and stent placement  of the innominate to see if his left arm graft can be salvaged.         If this does not provide adequate result then he will have to undergo venography of the right arm to determine whether there truly is subclavian occlusion and whether this is a suitable access site for dialysis access.     ____________________________ Gerald Cabal, MD ggs:bjt D: 12/11/2011 19:49:54 ET T: 12/12/2011 09:50:01 ET JOB#: [id number]  cc: Gerald Cabal, MD, <Dictator> Martie Lee. Gerald Bustard, MD Eduard Clos Gilford Rile, MD Gerald Cabal MD ELECTRONICALLY SIGNED 12/14/2011 10:56

## 2014-11-14 NOTE — H&P (Signed)
PATIENT NAME:  Gerald Cooke, Gerald Cooke MR#:  [id number] DATE OF BIRTH:  [birth year]  DATE OF ADMISSION:  10/09/2011  REFERRING PHYSICIAN: ER physician, Dr. Beather Arbour PRIMARY CARE PHYSICIAN: Dr. Ronette Deter NEPHROLOGIST: Dr. Holley Raring and Dr. Candiss Norse  VASCULAR SURGEON: Dr. Delana Meyer   CHIEF COMPLAINT: Fluttering in the chest, low blood pressure, high heart rate.   HISTORY OF PRESENT ILLNESS: patient is a 74 year old male with past medical history of end-stage renal disease who gets dialyzed Monday, Wednesday and Friday, hypertension, lung cancer status post treatment, right paratracheal mass, SVC syndrome who did get dialyzed yesterday. Patient reports that he was not feeling well the whole day. When he was getting ready to go to bed last night he did not feel good and felt fluttering in his chest and lightheaded. Therefore, he checked his vital signs and found that his blood pressure was low 92/60 and his heart rate was more than 100. He called EMS who found him to be in supraventricular tachycardia. It sounds like he received some adenosine. Patient reports that he was given some medication at home by the EMS to stop his heart and restart it. Currently, patient by the EKG strips done by EMS his heart rate was in the 120s. Currently, he is in normal sinus rhythm with a heart rate of 78 and is hemodynamically stable.   ALLERGIES: Heparin, tetanus/diphtheria toxoid.   MEDICATIONS: Patient did not bring a list of his medications. As per prescription writer he is taking:  1. Aspirin 325 mg daily.  2. Nexium 40 mg b.i.d.  3. Cetirizine 10 mg daily.  4. Simvastatin 20 mg daily.  5. Amlodipine 10 mg daily.  6. Renvela 3 tablets t.i.d.  7. Lisinopril 5 mg daily.  8. Omeprazole 20 mg daily.  9. Nephronex 5 mL daily.  10. Fish oil 1000 mg 2 capsules daily.  11. Flax seed oil 2600 mg b.i.d.    PAST MEDICAL/SURGICAL HISTORY:  1. SVC syndrome. 2. Thrombosis and thrombectomy of left brachial axillary graft.  3. PTA of  venous end graft.  4. PTA of innominate vein.  5. SVC gram 04/04/2009.  6. End-stage renal disease on dialysis Monday, Wednesday and Friday; complications related to dialysis graft.  7. Hypertension. 8. Lung cancer stage IIIB. 9. Incidental right tragal mass seen on CAT scan. Biopsies were consistent with poorly differentiated cancer, possibly lung cancer with TTS and PET which showed increased uptake in mediastinal lymph nodes. Biopsy was positive for carcinoma of the lung stage III diagnosis October 2009 status post carboplatin and Taxol and radiation therapy October 2009.  10. 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  11. Gastroesophageal reflux disease.  12. Hyperlipidemia.   FAMILY HISTORY: Mother had diabetes, hypertension. Father had hole in his heart. Sister died of myocardial infarction at the age of 46. Son died of myocardial infarction at age of 85.   SOCIAL HISTORY: Patient is married, has two children. He lives with his wife, who has dementia. He used to smoke 2 packs per day for 30 to 40 years, quit in 1997. Denies any alcohol abuse. 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    REVIEW OF SYSTEMS: CONSTITUTIONAL: Currently denies any fever, fatigue or weakness. EYES: Denies any blurred or double vision. ENT: Denies any tinnitus, ear pain. RESPIRATORY: Reports some cough. Denies any wheezing. CARDIOVASCULAR: Denies any chest pain. Had palpitations earlier. Denies any syncope. GASTROINTESTINAL: Denies any nausea, vomiting,  diarrhea, abdominal pain. GENITOURINARY: Denies any dysuria, hematuria. ENDO: Denies any polyuria, nocturia. HEME/LYMPH: Denies any anemia, easy bruisability. INTEGUMENT: Denies any acne, rash. MUSCULOSKELETAL: Denies any swelling, gout. NEUROLOGICAL: Denies any numbness, weakness. PSYCH: Denies any anxiety, insomnia.   PHYSICAL EXAMINATION:  VITAL SIGNS: Temperature  99.1, heart rate 106 initially, currently it is 78, respiratory rate 18, blood pressure 109/67, pulse oximetry 96% on room air.   GENERAL: Patient is a 74 year old Caucasian male sitting comfortably in bed, not in acute distress.   HEAD: Atraumatic, normocephalic.   EYES: There is pallor. No icterus or cyanosis. Pupils equal, round, and reactive to light and accommodation. Extraocular movements intact.   ENT: Wet mucous membranes. No oropharyngeal erythema or thrush.   NECK: Supple. No masses. No JVD. No thyromegaly, lymphadenopathy.   CHEST WALL: No tenderness to palpation. Not using accessory muscles of respiration. No intercostal muscle retractions. Patient has a port in his left chest wall.   LUNGS: Bilaterally clear to auscultation. No wheezing, rales or rhonchi.   CARDIOVASCULAR: S1, S2 regular. There is a systolic murmur. No rubs or gallops.   ABDOMEN: Soft, nontender, nondistended. No guarding. No rigidity. No organomegaly. Normal bowel sounds. There is a colostomy in place.   SKIN: No rashes or lesions.  PERIPHERIES: No pedal edema. 2+ pedal pulses. Patient has a dialysis fistula in his left upper arm.   MUSCULOSKELETAL: No cyanosis, clubbing.   NEUROLOGIC: Awake, alert, oriented x3. Nonfocal neurological exam. Cranial nerves grossly intact.   PSYCH: Normal mood and affect.   LABORATORY, DIAGNOSTIC AND RADIOLOGICAL DATA: Troponin 0.32. CK normal. Glucose 132, BUN 20, creatinine 3.81, rest of complete metabolic panel essentially normal. White count 5.7, hemoglobin 10.6, hematocrit 31.1, platelet count normal. Magnesium normal. TSH normal. First troponin was normal.   ASSESSMENT AND PLAN: 74 year old male with past medical history of lung cancer, end-stage renal disease, hypertension presents with fluttering in his chest, low blood pressure and high heart rate, found to be supraventricular tachycardia.  1. Supraventricular tachycardia. Patient was given adenosine by EMS at his  home and appears to have converted to sinus rhythm. He is currently in normal sinus rhythm and hemodynamically stable. Patient denies any history of cardiogenic problems in the past. Will admit the patient to the hospital. Discontinue his Norvasc and start him on beta blocker for rate and blood pressure control. Will monitor him on telemetry. Get echo and a cardiology consultation.  2. Elevated troponin. Patient's second troponin is elevated at 0.32. This is likely related to demand ischemia from the SVT. Will check two more serial cardiac enzymes and continue aspirin and beta blocker.  3. End-stage renal disease on hemodialysis. Patient was last dialyzed yesterday. Will obtain a nephrology consultation since he needs to be dialyzed tomorrow.  4. Hyperglycemia. This is likely reactive. Patient has no history of diabetes.  5. Anemia of chronic disease. Hemoglobin is 10.6. Will monitor closely.  6. Hypertension. Blood pressure is well controlled currently. Will continue low-dose lisinopril and beta blocker.  7. History of lung cancer status post chemotherapy and radiation therapy, currently in remission.   8. History of gastroesophageal reflux disease. Will continue PPI therapy.  9. History of hyperlipidemia. Will continue statin and fish oil.   10. Will place on GI and DVT prophylaxis.   Reviewed old medical records, discussed with the patient the plan of care and management.   TIME SPENT: 75 minutes.   ____________________________ Cherre Huger, MD sp:cms D: 10/09/2011 07:49:09 ET T: 10/09/2011 08:52:41 ET  JOB#: [id number] cc: Cherre Huger, MD, <Dictator> Eduard Clos. Gilford Rile, MD Cherre Huger MD ELECTRONICALLY SIGNED 10/12/2011 13:38

## 2014-11-14 NOTE — Discharge Summary (Signed)
PATIENT NAME:  Gerald Cooke, Gerald Cooke MR#:  [id number] DATE OF BIRTH:  [birth year]  DATE OF ADMISSION:  10/09/2011 DATE OF DISCHARGE:  10/10/2011  DISCHARGE DIAGNOSES:  1. Paroxysmal supraventricular tachycardia broke with adenosine now in sinus rhythm. 2. Mild to moderate aortic stenosis.  3. End-stage renal disease on hemodialysis.  4. Hypertension.  5. Hyperlipidemia.  6. History of lung cancer.  7. Elevated troponin secondary to supraventricular tachycardia. 8. History of superior vena cava syndrome. 9. History of abdominal aortic aneurysm rupture,s/pnephrectomy.s/p Partial bowel resection status post colostomy. 10. Gastroesophageal reflux disease.    CONSULTATIONS:  1. Cardiology, Dr. Ida Rogue. 2. Nephrology, Dr. Anthonette Legato.   HOSPITAL COURSE: A 74 year old male with multiple medical problems of end-stage kidney disease on hemodialysis Monday, Wednesday, Friday, hypertension, hyperlipidemia, history of ruptured abdominal aortic aneurysm with nephrectomy and bowel resection status post colostomy, history of SVC syndrome. He presented with fluttering in the chest, low blood pressure and was found to be in supraventricular tachycardia. He received adenosine by EMS and his supraventricular tachycardia broke with adenosine. He was started on low dose beta blocker. He also had some elevated troponin secondary to his tachycardia. At admission he had a normal white count of 5.7. He had a creatinine of 3.81. He is end-stage kidney disease. His magnesium was normal at 1.9. TSH was normal at 1.71. He had slightly elevated troponins in the range of 0.32 to 0.52 with normal CPK and MB. He is currently in sinus rhythm. Dr. Rockey Situ saw him and low dose Cardizem was added to the regimen also so he is on Cardizem and beta blocker which is regulating his heart rate. Amlodipine has been discontinued and he is continued on lisinopril. His echocardiogram was done which shows that he has normal LV function,  ejection fraction greater than 55%, impaired LV relaxation, mild concentric left ventricular hypertrophy. He has mild to moderate valvular aortic stenosis with valve area of around 1.4 cm squared. He had no further palpitations. No further symptoms right now and he is going to get dialysis today his regular dialysis. His urinalysis is essentially negative. His hemoglobin is stable in the range of 9.5 to 10.2. He had slightly high potassium of 5.2 but he is getting dialyzed today, that will take care of that.   DISCHARGE MEDICATIONS/HOME MEDICATIONS: 1. Aspirin 325 mg daily.  2. Cetirizine 10 mg daily.  3. Simvastatin 20 mg in the evening.  4. Renvela 3 tablets 3 times a daily before meals. 5. Lisinopril 5 mg daily.  6. Nephronex 5 mL once daily.  7. Fish oil 1000 mg 2 capsules at bedtime.  8. Zantac 75 mg b.i.d.  9. New medication: Cardizem CD 120 mg once daily.  10. Metoprolol 25 mg p.o. b.i.d.   NOTE: Do not take amlodipine.   DIET: Low sodium renal diet, low cholesterol diet.   ACTIVITY: As tolerated.   CONDITION AT DISCHARGE: He is comfortable.   PHYSICAL EXAMINATION: T-max 98.3, heart rate 65, blood pressure 146/62, saturating 95% on room air. Chest is clear. Heart sounds are regular. He is a murmur. Abdomen soft, nontender. He has a colostomy. He has left upper extremity swelling because of SVC syndrome.   FOLLOW UP:  1. Patient should follow up with Dr. Ronette Deter, in 1 to 2 weeks.  2. Hemodialysis Monday, Wednesday, Friday at [place].  3. Follow up with Dr. Rockey Situ, [facility], in 2 to 3 weeks.        TIME SPENT WITH DISCHARGE: 45 minutes.  ____________________________ Mena Pauls, MD ag:cms D: 10/10/2011 11:15:28 ET T: 10/10/2011 13:35:17 ET JOB#: [id number]  cc: Mena Pauls, MD, <Dictator> Eduard Clos. Gilford Rile, MD Minna Merritts, MD [place] Lilian Kapur, MD Murlean Iba, MD Mena Pauls MD ELECTRONICALLY SIGNED 10/21/2011 14:14

## 2014-11-14 NOTE — Consult Note (Signed)
General Aspect 74 year old male with past medical history of end-stage renal disease with HD on Monday, Wednesday and Friday, hypertension, lung cancer status post treatment, right paratracheal mass, SVC syndrome presenting after he had malaise, found to be in SVT with heart rate of 200 bpm, Cardiology was consulted for SVT, elevated cardiac enz, murmur.  He reports having malaise yesterday after working in the rain.  Yesterday PM,  he did not feel well and felt fluttering in his chest and lightheaded. He checked his vital signs and found that his blood pressure was low 92/60 and his heart rate was more than 100. He called EMS and rapid narow complex rhythm was recorded.  Mecication IV was given and rhythm broke to NSR.   In the ER, he was noted to have sinus tachycardia with rate of 120 bpm.  He reports having epsiodes like this in the past, sometimes once a month, never this long. Usually tachycardia will last for a few minutes.   ALLERGIES: Heparin, tetanus/diphtheria toxoid.   MEDICATIONS: Patient did not bring a list of his medications. As per prescription writer he is taking:  1. Aspirin 325 mg daily.  2. Nexium 40 mg b.i.d.  3. Cetirizine 10 mg daily.  4. Simvastatin 20 mg daily.  5. Amlodipine 10 mg daily.  6. Renvela 3 tablets t.i.d.  7. Lisinopril 5 mg daily.  8. Omeprazole 20 mg daily.  9. Nephronex 5 mL daily.  10. Fish oil 1000 mg 2 capsules daily.  11. Flax seed oil 2600 mg b.i.d.    Present Illness . PAST MEDICAL/SURGICAL HISTORY:  1. SVC syndrome. 2. Thrombosis and thrombectomy of left brachial axillary graft.  3. PTA of venous end graft.  4. PTA of innominate vein.  5. SVC gram 04/04/2009.  6. End-stage renal disease on dialysis Monday, Wednesday and Friday; complications related to dialysis graft.  7. Hypertension. 8. Lung cancer stage IIIB. 9. Incidental right tragal mass seen on CAT scan. Biopsies were consistent with poorly differentiated cancer, possibly lung  cancer.  Biopsy was positive for carcinoma of the lung stage III diagnosis October 2009 status post carboplatin and Taxol and radiation therapy October 2009.  10. 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  11. Gastroesophageal reflux disease.  12. Hyperlipidemia.   FAMILY HISTORY: Mother had diabetes, hypertension. Father had hole in his heart. Sister died of myocardial infarction at the age of 29. Son died of myocardial infarction at age of 21.   SOCIAL HISTORY: Patient is married, has two children. He lives with his wife, who has dementia. He used to smoke 2 packs per day for 30 to 40 years, quit in 1997. Denies any alcohol abuse. 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   Physical Exam:   GEN WD, WN, NAD    HEENT red conjunctivae    NECK supple  No masses    RESP normal resp effort  decreased BS in left base to mid way up    CARD Regular rate and rhythm  Normal, S1, S2  Murmur    Murmur Systolic  III/ VI    Systolic Murmur Out flow    ABD denies tenderness  soft    LYMPH negative neck    EXTR negative edema    SKIN normal to palpation    NEURO cranial nerves intact, motor/sensory function intact    PSYCH alert, A+O to time, place, person, good  insight   Review of Systems:   Subjective/Chief Complaint tachycardia, malaise, palpitations, now resolved    General: No Complaints    Skin: No Complaints    ENT: No Complaints    Eyes: No Complaints    Neck: No Complaints    Respiratory: Short of breath    Cardiovascular: Palpitations    Gastrointestinal: No Complaints    Genitourinary: No Complaints    Vascular: No Complaints    Musculoskeletal: No Complaints    Neurologic: No Complaints    Hematologic: No Complaints    Endocrine: No Complaints    Psychiatric: No Complaints    Review of Systems: All other systems were reviewed and found to be negative     Medications/Allergies Reviewed Medications/Allergies reviewed    Tetanus /Diphtheria Toxoid: Other  Heparin: Other    Impression 74 year old male with past medical history of end-stage renal disease with HD on Monday, Wednesday and Friday, hypertension, lung cancer status post treatment, right paratracheal mass, SVC syndrome presenting after he had malaise, found to be in SVT with heart rate of 200 bpm, found to be in SVT, elevated cardiac enz, murmur.  A/P: 1) SVT By his hx, I suspect he has had SVT in the past. Possibly for years. This is the worst epsiode to date. --Echo pending gievn significant murmur --Will add diltiazem 120 mg daily, hold amlodipine --Could continue low dose metoprolol 25 mg po BID as well. --Ace inh if BP tolerates.  --Can follow as an outpt. If he continues to have epsiodes, could start antiarrhythmic medication.  2) Murmur Could explain some of his SOB Could be tricuspic regurg, unable to exclude aortic valve disease.  Echo pending  3) SOB He does have a long hx of smoking, possible COPD Also with decraesed BS on the right? --Possibly repeat cxr if felt indicated Echo pending. Will evaluate RVSP. May be elevated in setting of smoking hx. If RVSP elevated, could drop his dry weight with HD.   4) Lung CA s/p chemo and XRT  5) Renal failure, on HD Followed by renal service.   6) Elevated cardiac enz: Troponin up to 0.5 Likely secondary to tachycardia, possibly in teh setting of underlying CAD Could consider outpt stress test. Currently with no symptoms He has no known CAD, does have PVD  7) H/o ruptured AAA outpt monitoring on annual basis   Electronic Signatures: Ida Rogue (MD)  (Signed 19-Mar-13 10:54)  Authored: General Aspect/Present Illness, History and Physical Exam, Review of System, Home Medications, Allergies, Impression/Plan   Last Updated: 19-Mar-13 10:54 by Ida Rogue (MD)
